# Patient Record
Sex: Male | Born: 1965 | Race: Black or African American | Hispanic: No | Marital: Single | Smoking: Never smoker
Health system: Southern US, Community
[De-identification: ages and names within clinical notes are randomized; demographics above are authoritative.]

## PROBLEM LIST (undated history)

## (undated) DIAGNOSIS — I639 Cerebral infarction, unspecified: Secondary | ICD-10-CM

## (undated) DIAGNOSIS — N183 Chronic kidney disease, stage 3 unspecified: Secondary | ICD-10-CM

## (undated) DIAGNOSIS — I10 Essential (primary) hypertension: Secondary | ICD-10-CM

## (undated) DIAGNOSIS — E785 Hyperlipidemia, unspecified: Secondary | ICD-10-CM

## (undated) DIAGNOSIS — R109 Unspecified abdominal pain: Secondary | ICD-10-CM

## (undated) DIAGNOSIS — F32A Depression, unspecified: Secondary | ICD-10-CM

## (undated) DIAGNOSIS — F329 Major depressive disorder, single episode, unspecified: Secondary | ICD-10-CM

## (undated) DIAGNOSIS — E119 Type 2 diabetes mellitus without complications: Secondary | ICD-10-CM

## (undated) DIAGNOSIS — Z8669 Personal history of other diseases of the nervous system and sense organs: Secondary | ICD-10-CM

## (undated) DIAGNOSIS — F1911 Other psychoactive substance abuse, in remission: Secondary | ICD-10-CM

## (undated) HISTORY — DX: Depression, unspecified: F32.A

## (undated) HISTORY — DX: Personal history of other diseases of the nervous system and sense organs: Z86.69

## (undated) HISTORY — DX: Hyperlipidemia, unspecified: E78.5

## (undated) HISTORY — PX: NO PAST SURGERIES: SHX2092

## (undated) HISTORY — DX: Essential (primary) hypertension: I10

## (undated) HISTORY — DX: Type 2 diabetes mellitus without complications: E11.9

## (undated) HISTORY — DX: Other psychoactive substance abuse, in remission: F19.11

## (undated) HISTORY — DX: Major depressive disorder, single episode, unspecified: F32.9

## (undated) HISTORY — DX: Unspecified abdominal pain: R10.9

## (undated) HISTORY — DX: Chronic kidney disease, stage 3 unspecified: N18.30

## (undated) HISTORY — DX: Cerebral infarction, unspecified: I63.9

---

## 2006-11-11 DIAGNOSIS — I639 Cerebral infarction, unspecified: Secondary | ICD-10-CM

## 2006-11-11 HISTORY — DX: Cerebral infarction, unspecified: I63.9

## 2012-09-30 ENCOUNTER — Telehealth: Payer: Self-pay | Admitting: Family

## 2012-09-30 ENCOUNTER — Ambulatory Visit (INDEPENDENT_AMBULATORY_CARE_PROVIDER_SITE_OTHER): Payer: Medicare Other | Admitting: Family

## 2012-09-30 ENCOUNTER — Encounter: Payer: Self-pay | Admitting: Family

## 2012-09-30 VITALS — BP 122/80 | HR 89 | Temp 98.5°F | Resp 16 | Ht 67.25 in | Wt 182.0 lb

## 2012-09-30 DIAGNOSIS — E1165 Type 2 diabetes mellitus with hyperglycemia: Secondary | ICD-10-CM

## 2012-09-30 DIAGNOSIS — I10 Essential (primary) hypertension: Secondary | ICD-10-CM | POA: Insufficient documentation

## 2012-09-30 DIAGNOSIS — M545 Other chronic pain: Secondary | ICD-10-CM | POA: Insufficient documentation

## 2012-09-30 DIAGNOSIS — G8929 Other chronic pain: Secondary | ICD-10-CM

## 2012-09-30 DIAGNOSIS — F329 Major depressive disorder, single episode, unspecified: Secondary | ICD-10-CM

## 2012-09-30 DIAGNOSIS — R7989 Other specified abnormal findings of blood chemistry: Secondary | ICD-10-CM | POA: Insufficient documentation

## 2012-09-30 DIAGNOSIS — R634 Abnormal weight loss: Secondary | ICD-10-CM

## 2012-09-30 DIAGNOSIS — E291 Testicular hypofunction: Secondary | ICD-10-CM

## 2012-09-30 DIAGNOSIS — E11319 Type 2 diabetes mellitus with unspecified diabetic retinopathy without macular edema: Secondary | ICD-10-CM | POA: Insufficient documentation

## 2012-09-30 DIAGNOSIS — E119 Type 2 diabetes mellitus without complications: Secondary | ICD-10-CM

## 2012-09-30 DIAGNOSIS — IMO0002 Reserved for concepts with insufficient information to code with codable children: Secondary | ICD-10-CM | POA: Insufficient documentation

## 2012-09-30 DIAGNOSIS — Z8673 Personal history of transient ischemic attack (TIA), and cerebral infarction without residual deficits: Secondary | ICD-10-CM

## 2012-09-30 DIAGNOSIS — E785 Hyperlipidemia, unspecified: Secondary | ICD-10-CM

## 2012-09-30 LAB — BASIC METABOLIC PANEL WITH GFR
BUN: 10 mg/dL (ref 6–23)
CO2: 26 mEq/L (ref 19–32)
Calcium: 9.8 mg/dL (ref 8.4–10.5)
Chloride: 102 mEq/L (ref 96–112)
Creat: 1 mg/dL (ref 0.50–1.35)
GFR, Est African American: >89 mL/min
GFR, Est Non African American: 89 mL/min
Glucose, Bld: 263 mg/dL — ABNORMAL HIGH (ref 70–99)
Potassium: 4 mEq/L (ref 3.5–5.3)
Sodium: 138 mEq/L (ref 135–145)

## 2012-09-30 LAB — HEPATIC FUNCTION PANEL
ALT: 16 U/L (ref 0–53)
AST: 12 U/L (ref 0–37)
Albumin: 4.3 g/dL (ref 3.5–5.2)
Alkaline Phosphatase: 67 U/L (ref 39–117)
Bilirubin, Direct: 0.1 mg/dL (ref 0.0–0.3)
Indirect Bilirubin: 0.8 mg/dL (ref 0.0–0.9)
Total Bilirubin: 0.9 mg/dL (ref 0.3–1.2)
Total Protein: 6.9 g/dL (ref 6.0–8.3)

## 2012-09-30 LAB — HEMOGLOBIN A1C
Hgb A1c MFr Bld: 14.1 % — ABNORMAL HIGH (ref <5.7)
Mean Plasma Glucose: 358 mg/dL — ABNORMAL HIGH (ref <117)

## 2012-09-30 LAB — TSH: TSH: 0.752 u[IU]/mL (ref 0.350–4.500)

## 2012-09-30 MED ORDER — GABAPENTIN 100 MG PO CAPS: 100.0000 mg | ORAL_CAPSULE | Freq: Three times a day (TID) | ORAL | Status: DC

## 2012-09-30 NOTE — Progress Notes (Signed)
Subjective:    Patient ID: James Chang, male    DOB: 1966-02-09, 46 y.o.   MRN: [id number]  HPI  James Chang is a 46 yr old male who presents today to establish care.    1)Depression- He follows with psychiatry.  He is prescribed Abilify- follows with [facility].  He does not take his abilify. "I just don't like it."  2) DM2- Diagnosed in 2002.  Sometimes forgets his lantus.  He reports that he usually takes 35-40 units.  Does not check sugars. Using 25 units of humalog AC meals.  Last A1C last week was 13.6.  Last eye exam was a few months ago at [facility]. (told Diabetic retinopathy in the right eye- mild)  3) HTN-Currently on lisinopril- denies ace cough.  4) Hyperlipidemia- He is on lipitor.    5) Migraines- reports that he has daily migraines.  Use "a hot rag and lays down."  Helps sometimes.  He reports that he used ibuprofen in the past but it caused GI upset.   6) Stroke- 2008, reports residual left arm numbness/weakness.    7) Low testosterone- Uses androgel.    8) Back pain/chronic- He reports that he has been following with Dr. Pryor Montes previously- reports that he was treated with oxycontin.  Pain is in the lower back (reports L3, L4,L5) bulging discs. Pain radiates into both legs. + throbbing.     Review of Systems  Constitutional:       Reports that his weight 1 year ago was 238 and has been as low as 170 in the last year  HENT: Negative for congestion.   Eyes: Positive for visual disturbance.  Respiratory: Negative for cough.   Cardiovascular: Negative for leg swelling.  Gastrointestinal: Negative for vomiting, diarrhea and constipation.  Genitourinary: Positive for frequency. Negative for dysuria and hematuria.  Musculoskeletal: Positive for back pain.  Skin: Negative for rash.  Neurological: Positive for headaches.  Hematological: Negative for adenopathy.  Psychiatric/Behavioral:       Reports + hx of anxiety- has been treated with klonopin in the past   Past  Medical History  Diagnosis Date  . Depression   . Diabetes mellitus without complication   . Hypertension   . Hyperlipidemia   . Stroke 2008  . History of migraine   . Personal history of drug abuse     History   Social History  . Marital Status: Single    Spouse Name: N/A    Number of Children: 3  . Years of Education: N/A   Occupational History  . Not on file.   Social History Main Topics  . Smoking status: Never Smoker   . Smokeless tobacco: Former Neurosurgeon  . Alcohol Use: Yes     Comment: seldom  . Drug Use: Not on file  . Sexually Active: Not on file   Other Topics Concern  . Not on file   Social History Narrative   Disabled truck driver- since stroke.Has 63 year old mother who is frail- he is now staying with her.3 children- youngest is 22.SingleCompleted the 12th gradePrevious hx of crack cocaine abuse.  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    Past Surgical History  Procedure Date  . No past surgeries     Family History  Problem Relation Age of Onset  . Arthritis Mother   . Stroke Father   . Hypertension Father   . Kidney disease Father   . Heart attack Father   . Cancer Neg Hx   .  Diabetes Neg Hx     No Known Allergies  Current Outpatient Prescriptions on File Prior to Visit  Medication Sig Dispense Refill  . gabapentin (NEURONTIN) 100 MG capsule Take 1 capsule (100 mg total) by mouth 3 (three) times daily.  90 capsule  3  . insulin glargine (LANTUS SOLOSTAR) 100 UNIT/ML injection Inject 70 Units into the skin at bedtime.      . insulin lispro (HUMALOG KWIKPEN) 100 UNIT/ML injection Inject 25 Units into the skin 3 (three) times daily before meals.      Marland Kitchen lisinopril (PRINIVIL,ZESTRIL) 10 MG tablet Take 10 mg by mouth daily.      . Testosterone (ANDROGEL) 20.25 MG/1.25GM (1.62%) GEL Place 4 Act onto the skin daily.        BP 122/80  Pulse 89  Temp 98.5 F (36.9 C) (Oral)  Resp 16  Ht 5' 7.25" (1.708 m)  Wt 182 lb (82.555 kg)  BMI 28.29 kg/m2  SpO2 99%        Objective:   Physical Exam  Constitutional: He is oriented to person, place, and time. He appears well-developed and well-nourished. No distress.  HENT:  Head: Normocephalic and atraumatic.  Eyes: Conjunctivae normal are normal.  Cardiovascular: Normal rate and regular rhythm.   No murmur heard. Pulmonary/Chest: Effort normal and breath sounds normal. No respiratory distress. He has no wheezes. He has no rales. He exhibits no tenderness.  Musculoskeletal: He exhibits no edema.  Neurological: He is alert and oriented to person, place, and time.  Skin: Skin is warm and dry.  Psychiatric: He has a normal mood and affect. His behavior is normal. Judgment and thought content normal.          Assessment & Plan:

## 2012-09-30 NOTE — Assessment & Plan Note (Signed)
He is maintained on androgel.

## 2012-09-30 NOTE — Assessment & Plan Note (Signed)
Obtain A1C, urine microalbumin, titrate up lantus insulin 3 units every 3 days until fasting sugar is <110.

## 2012-09-30 NOTE — Patient Instructions (Addendum)
Please complete your lab work prior to leaving. Increase lantus insulin by 3 units every 3 days until fasting sugar is <110.  Call if sugar <70. You will be contacted about your referral to Pain Management.  Please let us know if you have not heard back within 1 week about your referral. Please schedule a follow up appointment in 3 months. Welcome to [facility]!

## 2012-09-30 NOTE — Assessment & Plan Note (Signed)
Per pt this has been well controlled.  He is not compliant with psych recommendations though.

## 2012-09-30 NOTE — Assessment & Plan Note (Signed)
Will refer to pain management.  In the meantime, give trial of gabapentin.

## 2012-09-30 NOTE — Assessment & Plan Note (Signed)
BP is stable on lisinopril. Continue same, obtain bmet.  BP: 122/80 mmHg

## 2012-09-30 NOTE — Assessment & Plan Note (Signed)
He is not fasting today.  Plan FLP next visit.

## 2012-09-30 NOTE — Assessment & Plan Note (Signed)
Pt with residual left sided weakness.  He is not on aspirin.  Will add baby aspirin once daily for stroke prevention.  Work towards improving glycemic control and lipids.

## 2012-09-30 NOTE — Telephone Encounter (Signed)
Received medical records from [facility]  P: [phone] F: [phone]

## 2012-10-01 LAB — MICROALBUMIN / CREATININE URINE RATIO
Creatinine, Urine: 111.7 mg/dL
Microalb, Ur: <0.5 mg/dL (ref 0.00–1.89)

## 2012-10-07 ENCOUNTER — Encounter: Payer: Self-pay | Admitting: Family

## 2012-10-28 ENCOUNTER — Ambulatory Visit (INDEPENDENT_AMBULATORY_CARE_PROVIDER_SITE_OTHER): Payer: 59 | Admitting: Family

## 2012-10-28 ENCOUNTER — Encounter: Payer: Self-pay | Admitting: Family

## 2012-10-28 VITALS — BP 104/74 | HR 85 | Temp 98.5°F | Resp 16 | Ht 67.25 in | Wt 185.1 lb

## 2012-10-28 DIAGNOSIS — M545 Low back pain: Secondary | ICD-10-CM

## 2012-10-28 DIAGNOSIS — I10 Essential (primary) hypertension: Secondary | ICD-10-CM

## 2012-10-28 DIAGNOSIS — E1139 Type 2 diabetes mellitus with other diabetic ophthalmic complication: Secondary | ICD-10-CM

## 2012-10-28 DIAGNOSIS — G8929 Other chronic pain: Secondary | ICD-10-CM

## 2012-10-28 DIAGNOSIS — E119 Type 2 diabetes mellitus without complications: Secondary | ICD-10-CM

## 2012-10-28 DIAGNOSIS — E11319 Type 2 diabetes mellitus with unspecified diabetic retinopathy without macular edema: Secondary | ICD-10-CM

## 2012-10-28 MED ORDER — GABAPENTIN 300 MG PO CAPS: 300.0000 mg | ORAL_CAPSULE | Freq: Three times a day (TID) | ORAL | Status: DC

## 2012-10-28 NOTE — Assessment & Plan Note (Signed)
Unchanged. Has not titrated lantus as we discussed.  Will refer to endocrinology for further evaluation.

## 2012-10-28 NOTE — Patient Instructions (Signed)
Please call Dr. Cyndia Diver office to schedule pain management appointment: [phone] You will be contact about your referral to endocrinology. Please let us know if you have not heard back within 1 week about your referral. Follow up in 3 months.

## 2012-10-28 NOTE — Assessment & Plan Note (Signed)
Unchanged. Our office was contacted by Dr. Cyndia Diver office re: pain management referral and told that the patient did not return their calls to schedule an appointment and that a letter would be mailed.  Will increase gabapentin to 300mg  tid.  I have instructed pt to contact Dr. Cyndia Diver office to arrange an appointment- number was provided.

## 2012-10-28 NOTE — Progress Notes (Signed)
Subjective:    Patient ID: James Chang, male    DOB: 08/21/1966, 46 y.o.   MRN: [id number]  HPI  James Chang is a 46 yr old male who presents today for follow up.   Back Pain- Back pain radiates into his knees/ankles.  He has "doubled up on gabapentin."   Diabetes- reports 25 units humalog AC meals and 40 units lantus HS. He has not titrated up his lantus as we discussed last visit.   Review of Systems    see HPI  Past Medical History  Diagnosis Date  . Depression   . Diabetes mellitus without complication   . Hypertension   . Hyperlipidemia   . Stroke 2008  . History of migraine   . Personal history of drug abuse     History   Social History  . Marital Status: Single    Spouse Name: N/A    Number of Children: 3  . Years of Education: N/A   Occupational History  . Not on file.   Social History Main Topics  . Smoking status: Never Smoker   . Smokeless tobacco: Former Neurosurgeon  . Alcohol Use: Yes     Comment: seldom  . Drug Use: Not on file  . Sexually Active: Not on file   Other Topics Concern  . Not on file   Social History Narrative   Disabled truck driver- since stroke.Has 76 year old mother who is frail- he is now staying with her.3 children- youngest is 68.SingleCompleted the 12th gradePrevious hx of crack cocaine abuse.  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    Past Surgical History  Procedure Date  . No past surgeries     Family History  Problem Relation Age of Onset  . Arthritis Mother   . Stroke Father   . Hypertension Father   . Kidney disease Father   . Heart attack Father   . Cancer Neg Hx   . Diabetes Neg Hx     No Known Allergies  Current Outpatient Prescriptions on File Prior to Visit  Medication Sig Dispense Refill  . atorvastatin (LIPITOR) 10 MG tablet Take 10 mg by mouth daily.      . insulin glargine (LANTUS SOLOSTAR) 100 UNIT/ML injection Inject 70 Units into the skin at bedtime.      . insulin lispro (HUMALOG KWIKPEN) 100 UNIT/ML  injection Inject 25 Units into the skin 3 (three) times daily before meals.      Marland Kitchen lisinopril (PRINIVIL,ZESTRIL) 10 MG tablet Take 10 mg by mouth daily.      . Testosterone (ANDROGEL) 20.25 MG/1.25GM (1.62%) GEL Place 4 Act onto the skin daily.      Marland Kitchen gabapentin (NEURONTIN) 300 MG capsule Take 1 capsule (300 mg total) by mouth 3 (three) times daily.  90 capsule  2    BP 104/74  Pulse 85  Temp 98.5 F (36.9 C) (Oral)  Resp 16  Ht 5' 7.25" (1.708 m)  Wt 185 lb 1.3 oz (83.952 kg)  BMI 28.77 kg/m2  SpO2 99%    Objective:   Physical Exam  Constitutional: He is oriented to person, place, and time. He appears well-developed and well-nourished. No distress.  HENT:  Head: Normocephalic and atraumatic.  Cardiovascular: Normal rate.   No murmur heard. Pulmonary/Chest: Effort normal and breath sounds normal.  Musculoskeletal: He exhibits no edema.  Neurological: He is alert and oriented to person, place, and time.  Skin: Skin is warm.  Psychiatric: He has a normal mood and affect.  His behavior is normal. Judgment and thought content normal.          Assessment & Plan:

## 2012-11-02 ENCOUNTER — Ambulatory Visit (INDEPENDENT_AMBULATORY_CARE_PROVIDER_SITE_OTHER): Payer: 59 | Admitting: Internal Medicine

## 2012-11-02 ENCOUNTER — Encounter: Payer: Self-pay | Admitting: Internal Medicine

## 2012-11-02 VITALS — BP 128/82 | HR 93 | Temp 98.7°F | Wt 188.0 lb

## 2012-11-02 DIAGNOSIS — E11319 Type 2 diabetes mellitus with unspecified diabetic retinopathy without macular edema: Secondary | ICD-10-CM

## 2012-11-02 DIAGNOSIS — E1139 Type 2 diabetes mellitus with other diabetic ophthalmic complication: Secondary | ICD-10-CM

## 2012-11-02 DIAGNOSIS — E1165 Type 2 diabetes mellitus with hyperglycemia: Secondary | ICD-10-CM

## 2012-11-02 MED ORDER — INSULIN LISPRO 100 UNIT/ML ~~LOC~~ SOLN: 20.0000 [IU] | Freq: Three times a day (TID) | SUBCUTANEOUS | Status: DC

## 2012-11-02 MED ORDER — GLUCOSE BLOOD VI STRP: ORAL_STRIP | Status: DC

## 2012-11-02 MED ORDER — INSULIN GLARGINE 100 UNIT/ML ~~LOC~~ SOLN: 20.0000 [IU] | Freq: Every day | SUBCUTANEOUS | Status: DC

## 2012-11-02 MED ORDER — ONETOUCH ULTRASOFT LANCETS MISC: Status: DC

## 2012-11-02 NOTE — Patient Instructions (Addendum)
Please check your sugars 2x a day as advised. Bring your sugar log at our next appointment. Please call me on Friday with your sugars Please start: - Lantus 20 units every night - Humalog 20 units 3 times a day 15 min before meals I referred you for Diabetes Education.  Patient instructions for Diabetes mellitus type 2:  DIET AND EXERCISE Diet and exercise is an important part of diabetic treatment.  We recommended aerobic exercise in the form of brisk walking (working between 40-60% of maximal aerobic capacity, similar to brisk walking) for 150 minutes per week (such as 30 minutes five days per week) along with 3 times per week performing 'resistance' training (using various gauge rubber tubes with handles) 5-10 exercises involving the major muscle groups (upper body, lower body and core) performing 10-15 repetitions (or near fatigue) each exercise. Start at half the above goal but build slowly to reach the above goals. If limited by weight, joint pain, or disability, we recommend daily walking in a swimming pool with water up to waist to reduce pressure from joints while allow for adequate exercise.    BLOOD GLUCOSES Monitoring your blood glucoses is important for continued management of your diabetes. Please check your blood glucoses 2 times a day: fasting, before meals and at bedtime (you can rotate these measurements   HYPOGLYCEMIA (low blood sugar) Hypoglycemia is usually a reaction to not eating, exercising, or taking too much insulin/ other diabetes drugs.  Symptoms include tremors, sweating, hunger, confusion, headache, etc. Treat IMMEDIATELY with 15 grams of Carbs:   4 glucose tablets    cup regular juice/soda   2 tablespoons raisins   4 teaspoons sugar   1 tablespoon honey Recheck blood glucose in 15 mins and repeat above if still symptomatic/blood glucose <100. Please contact our office at [phone] if you have questions about how to next handle your  insulin.  RECOMMENDATIONS TO REDUCE YOUR RISK OF DIABETIC COMPLICATIONS: * Take your prescribed MEDICATION(S). * Follow a DIABETIC diet: Complex carbs, fiber rich foods, heart healthy fish twice weekly, (monounsaturated and polyunsaturated) fats * AVOID saturated/trans fats, high fat foods, >2,300 mg salt per day. * EXERCISE at least 5 times a week for 30 minutes or preferably daily.  * DO NOT SMOKE OR DRINK more than 1 drink a day. * Check your FEET every day. Do not wear tightfitting shoes. Contact us if you develop an ulcer * See your EYE doctor once a year or more if needed * Get a FLU shot once a year * Get a PNEUMONIA vaccine every 5 years and once after age 37 years  GOALS:  * Your Hemoglobin A1c of 7%  * Your Systolic BP should be 130 or lower  * Your Diastolic BP should be 80 or lower  * Your HDL (Good Cholesterol) should be 40 or higher  * Your LDL (Bad Cholesterol) should be 100 or lower  * Your Triglycerides should be 150 or lower  * Your Urine microalbumin (kidney function) of <30 * Your Body Mass Index should be 25 or lower  We will be glad to help you achieve these goals. Our telephone number is: [phone].

## 2012-11-02 NOTE — Progress Notes (Signed)
Subjective:     Patient ID: James Chang, male   DOB: 17-Feb-1966, 46 y.o.   MRN: [id number]  HPI James Chang is a pleasant 46 y/o man referred by PCP, James Craze NP, for management of DM2, uncontrolled, insulin-dependent, c/w retinopathy and neuropathy.  He was dx with DM2 in 2000. He was initially on oral medications (which he did not really take consistently), then soon after, on insulin. His last HbA1c: Lab Results  Component Value Date   HGBA1C 14.1* 09/30/2012  He is taking: - Lantus: is supposed to be on 70 units in hs but taking only 35-40 units when he decides to take it (upon questioning, admits not taking it 7/7 days!!!)  (of note, he took 35 units of Levemir 3 days ago - he had this from a previous regimen) - Humalog 25 units tid ac (he usually takes the morning one only) He denies having problems with paying for the meds as they are covered by his insurance. He tells me that he felt terrible when taking the 70 units (likely hypoglycemia) He does not check his sugars. In the last 2-3 years, he did not get lower than 230. He thinks he feels when getting hypoglycemia. No problems with the injection sites. He injects in the abdomen.  He lost a lot of weight from 238 to 170 in ~4-5 years, recently gained up to 188 lbs. He has nocturia 4-5 times a night and urinates frequently over the day. He is thirsty all the time - drinks mostly water, but soda, too. He has 3 large meals a day. They are approximately equal in size.   He has numbness and tingling right leg and hand. He has retinopathy and experiences blurry vision in both eyes. His last eye exam was last month.   He had the flu vaccine this season.  He is seen at [facility]; was recently taken off Klonopin completely 2 mo ago.   I reviewed his chart including office notes, telephone notes, labs, and available scanned records.  Review of Systems Constitutional: both weight gain/loss (see above), +  fatigue Eyes: + blurry vision ENT: no sore throat, no nodules palpated in throat Cardiovascular: no CP/SOB/palpitations/leg swelling Respiratory: no cough/SOB Gastrointestinal: no N/V/D/C now, had 2 episodes of N/V last week Musculoskeletal: + muscle/joint aches Skin: no rashes Neurological: no tremors, + numbness/tingling, no dizziness. + HA. Psychiatric: + depression about not working, + anxiety Also low libido and difficulty with erections.  Past Medical History  Diagnosis Date  . Depression   . Diabetes mellitus without complication   . Hypertension   . Hyperlipidemia   . Stroke 2008  . History of migraine   . Personal history of drug abuse    Past Surgical History  Procedure Date  . No past surgeries    History   Social History  . Marital Status: Single    Spouse Name: N/A    Number of Children: 3  . Years of Education: N/A   Occupational History  . Not on file.   Social History Main Topics  . Smoking status: Never Smoker   . Smokeless tobacco: Former Neurosurgeon  . Alcohol Use: Yes     Comment: seldom  . Drug Use: Not on file  . Sexually Active: Not on file   Other Topics Concern  . 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   Current Outpatient Prescriptions on File Prior to Visit  Medication Sig Dispense Refill  . atorvastatin (LIPITOR) 10 MG tablet Take 10 mg by mouth daily.      Marland Kitchen gabapentin (NEURONTIN) 300 MG capsule Take 1 capsule (300 mg total) by mouth 3 (three) times daily.  90 capsule  2  . insulin glargine (LANTUS SOLOSTAR) 100 UNIT/ML injection Inject 20 Units into the skin at bedtime.  15 mL  11  . insulin lispro (HUMALOG KWIKPEN) 100 UNIT/ML injection Inject 20 Units into the skin 3 (three) times daily before meals.  15 mL  11  .  lisinopril (PRINIVIL,ZESTRIL) 10 MG tablet Take 10 mg by mouth daily.      . Testosterone (ANDROGEL) 20.25 MG/1.25GM (1.62%) GEL Place 4 Act onto the skin daily.       No Known Allergies  Family History  Problem Relation Age of Onset  . Arthritis Mother   . Stroke Father   . Hypertension Father   . Kidney disease Father   . Heart attack Father   . Cancer Neg Hx   . Diabetes Neg Hx    Objective:   Physical Exam BP 128/82  Pulse 93  Temp 98.7 F (37.1 C) (Oral)  Wt 188 lb (85.276 kg)  SpO2 97% Wt Readings from Last 3 Encounters:  11/02/12 188 lb (85.276 kg)  10/28/12 185 lb 1.3 oz (83.952 kg)  09/30/12 182 lb (82.555 kg)   Constitutional:  AA man, with normal weight, in NAD Eyes: PERRLA, EOMI, no exophthalmos ENT: moist mucous membranes, missing teeth, no thyromegaly, no cervical lymphadenopathy Cardiovascular: RRR, No MRG Respiratory: CTA B Gastrointestinal: abdomen soft, NT, ND, BS+ Musculoskeletal: no deformities, strength intact in all 4 Skin: moist, warm, no rashes Neurological: no tremor with outstretched hands, DTR normal in all 4    Assessment:     1. Dm2, uncontrolled, insulin-dependent, with complications - retinopathy - neuropathy - long h/o noncompliance    Plan:     - pt with very uncontrolled DM2, admitting for almost total noncompliance with his diabetic regimen, but not for financial purposes - we had a long discussion about consequences of perpetually uncontrolled DM2 and he tells me he would like to start getting his sugars down - I offered to either continue his (intended) Lantus + Humalog tid regimen or to try a combination long+rapid acting regimen (e.g. 70/30 or 75/25) bid. He understood that the first choice means 4 injections a day while the second choice means only 2. He opted for the first choice. - We will start with: Lantus 20 units in HS Humalog 20 units tid ac - I advised him to call me in the next few days to adjust his Lantus as I  expect he will need more  - I refilled his Lantus and Humalog - I gave him a OneTouch UltraMini meter - I sent Rx for lancets and strips to his pharmacy - given general instructions for DM2, proper foot care handout, hypoglycemia management handout and brochures about healthy eating, injection sites, sick day rules - I also referred him for DM Education - No labs for today - I will see him back in 1 month, If the above regimen does not work for him, we will simplify the regimen at next visit.

## 2012-11-10 ENCOUNTER — Telehealth: Payer: Self-pay | Admitting: Internal Medicine

## 2012-11-10 NOTE — Telephone Encounter (Signed)
The patient called to report the glucometer that Dr. Elvera Lennox gave him at his last appointment is no longer working.  He is wondering if he is able to obtain another glucometer.  Please call the patient at [phone].

## 2012-11-10 NOTE — Telephone Encounter (Signed)
James Chang, can you please ask him to come in to get another one?  I can also send a Rx to his pharmacy for one that is covered by his insurance if he prefers - in this case, he needs to check with his insurance about the covered brands.

## 2012-11-10 NOTE — Telephone Encounter (Signed)
Left message pt to call ins copy for coverage, in the meantime, pt can come by the office and pick up meter

## 2012-11-18 ENCOUNTER — Telehealth: Payer: Self-pay | Admitting: Internal Medicine

## 2012-11-18 NOTE — Telephone Encounter (Signed)
Left message on cell # of Rx at his pharmacy.

## 2012-11-18 NOTE — Telephone Encounter (Signed)
The patient called to request that Dr. Elvera Lennox call in Lantus Solostar and One Touch Mini test strips to [facility].  Patient may be reached at [phone].

## 2012-11-18 NOTE — Telephone Encounter (Signed)
I called the pharmacy and they have both prescriptions (I sent those at the time of his appt on 11/02/12). Fannie Knee, can you please let him know? Thank you.

## 2012-11-24 ENCOUNTER — Telehealth: Payer: Self-pay | Admitting: Family

## 2012-11-24 ENCOUNTER — Telehealth: Payer: Self-pay | Admitting: Internal Medicine

## 2012-11-24 DIAGNOSIS — G629 Polyneuropathy, unspecified: Secondary | ICD-10-CM | POA: Insufficient documentation

## 2012-11-24 MED ORDER — PREGABALIN 50 MG PO CAPS: ORAL_CAPSULE | ORAL | Status: DC

## 2012-11-24 NOTE — Telephone Encounter (Signed)
The patient called to request a rx for Lyrica for his pain.  The patient may be reached at [phone].

## 2012-11-24 NOTE — Telephone Encounter (Signed)
Is calling to see if MD will write script for Lyrica. Advised per EPIC, has been sent to pharmacy. Should start slow and increase as instructed on prescription. Is not to use Neurontin while taking Lyrica. Voices understanding.

## 2012-11-24 NOTE — Telephone Encounter (Signed)
rx faxed to [facility]

## 2012-12-01 ENCOUNTER — Encounter: Payer: Self-pay | Admitting: *Deleted

## 2012-12-01 ENCOUNTER — Encounter: Payer: 59 | Attending: Internal Medicine | Admitting: *Deleted

## 2012-12-01 VITALS — Ht 66.0 in | Wt 183.2 lb

## 2012-12-01 DIAGNOSIS — Z713 Dietary counseling and surveillance: Secondary | ICD-10-CM | POA: Insufficient documentation

## 2012-12-01 DIAGNOSIS — E1139 Type 2 diabetes mellitus with other diabetic ophthalmic complication: Secondary | ICD-10-CM | POA: Insufficient documentation

## 2012-12-01 DIAGNOSIS — E1165 Type 2 diabetes mellitus with hyperglycemia: Secondary | ICD-10-CM | POA: Insufficient documentation

## 2012-12-01 DIAGNOSIS — E11319 Type 2 diabetes mellitus with unspecified diabetic retinopathy without macular edema: Secondary | ICD-10-CM

## 2012-12-01 NOTE — Patient Instructions (Signed)
Plan:  Aim for 5 Carb Choices per meal (75 grams) +/- 1 either way  Aim for 0-2 Carbs per snack if hungry  Consider reading food labels for Total Carbohydrate of foods Consider taking insulin more consistently before meals and at 9 PM as directed by MD Consider talking to MD about switching to 70/30 insulin as she has suggested so only 2 injections / day Consider 8 oz cup of regular soda at meals (= 2 Carb choices) and the rest of your beverage as water

## 2012-12-01 NOTE — Progress Notes (Signed)
Medical Nutrition Therapy:  Appt start time: 1100 end time:  1200.  Assessment:  Primary concerns today: patient here for DM2 uncontrolled. Retired [employer] architect, currently disabled. Cares for his mother who is frail at 48 years old. He prepares her meals for her to help keep her weight up. He started our visit stating he knows it's his fault that diabetes is out of control and that he has been told what all the complications are, but that doesn't have any impact on his compliance. He also complains of neuropathy with severe pain in his legs and retinopathy. He now has a meter and states he SMBG a few times a week. He doesn't like having to take 4 shots a day plus checking his BG daily so he skips either if he doesn't feel like doing it.  MEDICATIONS: see list   DIETARY INTAKE:  Usual eating pattern includes 3 meals and 3 or more snacks per day.  Everyday foods include mostly meats and starches, limited fruits and vegetables.  Avoided foods include none stated.    24-hr recall:  B ( AM): 2 sausage patties, 2 eggs, large bowl of grits, cup of fruit, 2 toast with butter with honey, 2-3 glasses regular soda or sweet tea  Snk ( AM): variety of crackers, chips, occasionally fresh or canned fruit, cookies, candy, whatever he is hungry for  L ( PM): could be main meal: baked meat, starch and occasionally a vegetable OR Oodles of Noodles, soda, sweet tea or water Snk ( PM): same as AM D ( PM): cooks again, full meal as able Snk ( PM): sandwich or PNB crackers Beverages: soda, sweet tea or water   Usual physical activity: none due to neuropathy and back pain  Estimated energy needs: 2400 calories 270 g carbohydrates 180 g protein 67 g fat  Progress Towards Goal(s):  In progress.   Nutritional Diagnosis:  NB-1.1 Food and nutrition-related knowledge deficit As related to uncontrolled Diabetes.  As evidenced by A1c of 14.1% on 09/30/12.    Intervention:  Nutrition counseling and diabetes  education initiated. Discussed basic physiology of diabetes, SMBG and how to use the information to make decisions about his management. Reviewed explanation of  A1c and that a decrease of 1-2% every 3 months would be a significant improvement. Introduced him to [employer] using food groups to teach and he expressed very good understanding of that concept and when I showed him the Carb content of his regular sodas he exclaimed "That's way too much!" So I have suggested he aim for 8 oz of soda and use water for remainder of his beverages at each meal. He agreed. Finally taught him about the insulin action of his Lantus and Humalog vs. A pre mixed insulin per MD notes and he feels he would be better able to take his insulin more consistently with the pre-mixed option.   Plan:  Aim for 5 Carb Choices per meal (75 grams) +/- 1 either way  Aim for 0-2 Carbs per snack if hungry  Consider reading food labels for Total Carbohydrate of foods Consider taking Humalot insulin more consistently before meals and Lantus at 9 PM as directed by MD Consider talking to MD about switching to 70/30 insulin as she has suggested so only 2 injections / day Consider 8 oz cup of regular soda at meals (= 2 Carb choices) and the rest of your beverage as water  Handouts given during visit include: Living Well with Diabetes Carb Counting and Food  Label handouts Meal Plan Card  Diabetes Medication Sheet to show insulin action  Monitoring/Evaluation:  Dietary intake, exercise, taking insulin more consistentlh, and body weight in 1 month(s).

## 2012-12-04 ENCOUNTER — Ambulatory Visit (INDEPENDENT_AMBULATORY_CARE_PROVIDER_SITE_OTHER): Payer: 59 | Admitting: Internal Medicine

## 2012-12-04 ENCOUNTER — Encounter: Payer: Self-pay | Admitting: Internal Medicine

## 2012-12-04 VITALS — BP 110/80 | HR 81 | Temp 97.7°F | Resp 16 | Ht 67.0 in | Wt 177.0 lb

## 2012-12-04 DIAGNOSIS — E1139 Type 2 diabetes mellitus with other diabetic ophthalmic complication: Secondary | ICD-10-CM

## 2012-12-04 DIAGNOSIS — G629 Polyneuropathy, unspecified: Secondary | ICD-10-CM

## 2012-12-04 DIAGNOSIS — E1165 Type 2 diabetes mellitus with hyperglycemia: Secondary | ICD-10-CM

## 2012-12-04 DIAGNOSIS — G609 Hereditary and idiopathic neuropathy, unspecified: Secondary | ICD-10-CM

## 2012-12-04 DIAGNOSIS — E11319 Type 2 diabetes mellitus with unspecified diabetic retinopathy without macular edema: Secondary | ICD-10-CM

## 2012-12-04 MED ORDER — INSULIN ASPART PROT & ASPART (70-30 MIX) 100 UNIT/ML ~~LOC~~ SUSP: SUBCUTANEOUS | Status: DC

## 2012-12-04 NOTE — Progress Notes (Signed)
Subjective:     Patient ID: James Chang, male   DOB: 16-Aug-1966, 47 y.o.   MRN: [id number]  HPI James Chang is a pleasant 47 y/o man returning for f/u for DM2, uncontrolled, insulin-dependent, c/w retinopathy and neuropathy.  He was dx with DM2 in 2000. He was initially on oral medications (which he did not really take consistently), then soon after, on insulin. His last HbA1c: Lab Results  Component Value Date   HGBA1C 14.1* 09/30/2012  Last microalbumin over creatinine ratio was undetectable 2 months ago.  When I saw him 47 years ago, he was not checking his sugars. I advised him to do so, and I gave him sugar logs to record them. He was telling me then that in the last 2-3 years, he did not get lower than 230. He thinks he feels when getting hypoglycemia. No problems with the injection sites. He injects in the abdomen.  He checks sugars 2x a day: - am: 214-250 - hS: 240-250 No lows. Highest: 260.  At last visit, since he wasn't taking his insulin as advised, we had to start anew, so I advised him to take: Lantus 35 units in HS Humalog 25 units (should be tid ac), but taking it 1-2 a day - usually b'fast I advised him then to call me soon after that visit for pattern management and insulin dose adjusting, but he did not do so.  He denied having problems with paying for the meds as they are covered by his insurance. We did discuss at last visit whether we should switch him to a more simple insulin regimen, a 70/30 combination, but he refused. I referred him to diabetes education, and he had an appointment with James Chang few days ago, during which he expressed his wish for more simple regimen, and he was open to a combination dosing.  His diet is still poor, but he still lost some weight since last visit. He had lost a lot of weight from 238 to 170 in ~4-5 years, recently gained back.   He has numbness and tingling right leg and hand. He called me approximately 10 days ago to  inquire about Lyrica. I stopped his Neurontin at that time, and sent Lyrica to his pharmacy, instructing him to increase the dose slowly based on symptoms. I left Neurontin on his medication list, just in case he cannot obtain a cannot tolerate the Lyrica. He has good effect with Lyrica (50 mg) - took the burning away. He noticed some anxiety lately (he is off Klonopin) and wonders if this is from Lyrica.   He has retinopathy and experiences blurry vision in both eyes. His last eye exam was 2 months ago.   At last visit, I sent Rx for lancets and strips to his pharmacy and given him general instructions for DM2, proper foot care handout, hypoglycemia management handout and brochures about healthy eating, injection sites, sick day rules. He had the flu vaccine this season.   He is seen at [facility]. Off Klonopin now.  I reviewed pt's medications, allergies, PMH, social hx, family hx and no changes required exc.: He recently moved to a new appt.  Review of Systems Constitutional: no weight gain/loss, no fatigue, no subjective hyperthermia/hypothermia Eyes: no blurry vision, no xerophthalmia ENT: no sore throat, no nodules palpated in throat, no dysphagia/odynophagia, no hoarseness Cardiovascular: no CP/SOB/palpitations/leg swelling Respiratory: no cough/SOB Gastrointestinal: no N/+ V/D/C Musculoskeletal: + muscle/joint aches Skin: no rashes Neurological: no tremors/numbness/tingling/dizziness; HA Psychiatric: no  depression/anxiety Nocturia 2-3 times; low libido; difficulty with erections    Objective:   Physical Exam There were no vitals taken for this visit. Wt Readings from Last 3 Encounters:  12/01/12 183 lb 3.2 oz (83.099 kg)  11/02/12 188 lb (85.276 kg)  10/28/12 185 lb 1.3 oz (83.952 kg)   Constitutional: normal weight, in NAD Eyes: PERRLA, EOMI, no exophthalmos ENT: moist mucous membranes, no thyromegaly, no cervical lymphadenopathy Cardiovascular: RRR, No  MRG Respiratory: CTA B Gastrointestinal: abdomen soft, NT, ND, BS+ Musculoskeletal: no deformities, strength intact in all 4 Skin: moist, warm, no rashes Neurological: no tremor with outstretched hands, DTR normal in all 4     Assessment:     1. DM2, uncontrolled, insulin-dependent, with complications - retinopathy - neuropathy - long h/o noncompliance  2. Peripheral neuropathy    Plan:   1. DM2  -  Patient has slightly better control after becoming more compliant with his insulin regimen. However he tells me that he does not feel like taking 4 injections a day and he is open to switch to a less complicated regimen. - Since he is now taking 60 units a day total (Lantus 35 units and NovoLog 25 units usually once a day), I will start him on NovoLog 70/30 Flexpen 40 units in a.m. and 20 units with dinner. I hope that this would be approved by his insurance. He tells me that he has a good breakfast every day, however he grazes throughout the day afterwords. His dinner is not set certain times, and he can eat anywhere from 6 PM to 10 PM. I advised him to try to eat consistently before 7 PM. - I encouraged him to continue to check his sugars, and gave him new CBG logs - I encouraged him to call me or send me a message through my chart if his sugars are consistently in the 200 or if he has lows. - He will return in a month with his log - No labs for today  - I signed for his transportation forms today  2. Peripheral neuropathy - He is at the lower dose of Lyrica, with good results. His feet burning is now resolved, for which she is very happy about - I told him to stay at the lower dose if this helps his symptoms - Regarding his anxiety, I am not sure if this is caused by Lyrica, especially with his history of anxiety and depression and previous treatment with Klonopin. I advised him to stay on this low dose of Lyrica for now to give his body a chance to adapt to it. If he continues to have  the anxiety, then he will probably need anxiety medication, or even to come off Lyrica - For now will take Neurontin off his medication list

## 2012-12-04 NOTE — Patient Instructions (Addendum)
Please stop the Lantus and Humalog and start the 70/30 pen: - 40 units 15 min before breakfast - 20 units 15 min before dinner Try to have dinner no later than 7 pm Please let me know if your sugars are consistently >250 or <70. Please call me with any question or concerns: [phone].

## 2012-12-28 ENCOUNTER — Ambulatory Visit: Payer: Medicare Other | Admitting: Family

## 2012-12-28 ENCOUNTER — Encounter: Payer: 59 | Admitting: *Deleted

## 2012-12-29 ENCOUNTER — Encounter: Payer: Self-pay | Admitting: Family

## 2012-12-29 ENCOUNTER — Ambulatory Visit: Payer: 59 | Admitting: *Deleted

## 2012-12-29 ENCOUNTER — Ambulatory Visit (INDEPENDENT_AMBULATORY_CARE_PROVIDER_SITE_OTHER): Payer: 59 | Admitting: Family

## 2012-12-29 VITALS — BP 120/80 | HR 82 | Temp 98.4°F | Resp 16 | Ht 67.25 in | Wt 182.1 lb

## 2012-12-29 DIAGNOSIS — E11319 Type 2 diabetes mellitus with unspecified diabetic retinopathy without macular edema: Secondary | ICD-10-CM

## 2012-12-29 DIAGNOSIS — E1165 Type 2 diabetes mellitus with hyperglycemia: Secondary | ICD-10-CM

## 2012-12-29 DIAGNOSIS — M549 Dorsalgia, unspecified: Secondary | ICD-10-CM

## 2012-12-29 DIAGNOSIS — E1139 Type 2 diabetes mellitus with other diabetic ophthalmic complication: Secondary | ICD-10-CM

## 2012-12-29 DIAGNOSIS — G8929 Other chronic pain: Secondary | ICD-10-CM

## 2012-12-29 DIAGNOSIS — E291 Testicular hypofunction: Secondary | ICD-10-CM

## 2012-12-29 DIAGNOSIS — R7989 Other specified abnormal findings of blood chemistry: Secondary | ICD-10-CM

## 2012-12-29 DIAGNOSIS — M545 Low back pain: Secondary | ICD-10-CM

## 2012-12-29 MED ORDER — TESTOSTERONE 20.25 MG/1.25GM (1.62%) TD GEL: 4.0000 | Freq: Every day | TRANSDERMAL | Status: DC

## 2012-12-29 NOTE — Patient Instructions (Addendum)
Please complete your lab work prior to leaving. Follow up in 3 months. You will be contacted about your referral to pain management.

## 2012-12-29 NOTE — Progress Notes (Signed)
Subjective:    Patient ID: James Chang, male    DOB: Mar 23, 1966, 47 y.o.   MRN: [id number]  HPI  DM2-  Reports that his sugars are improving.  He saw Dr. Wyonia Hough.  She switched him from lantus to 70/30.  Has improved compliance.   Hyperlipidemia- Reports that he is not always taking statin.   Peripheral neuropathy- reports lyrica is helping.    Reports pneumovax given last year with his previous provider.     Review of Systems See hpi  Past Medical History  Diagnosis Date  . Depression   . Diabetes mellitus without complication   . Hypertension   . Hyperlipidemia   . Stroke 2008  . History of migraine   . Personal history of drug abuse     History   Social History  . Marital Status: Single    Spouse Name: N/A    Number of Children: 3  . Years of Education: N/A   Occupational History  . Not on file.   Social History Main Topics  . Smoking status: Never Smoker   . Smokeless tobacco: Never Used  . Alcohol Use: Yes     Comment: seldom 1-2 times a month beer  . Drug Use: Not on file  . Sexually Active: Not on file   Other Topics Concern  . Not on file   Social History Narrative   Disabled truck driver- since stroke.   Has 39 year old mother who is frail- he is now staying with her.   3 children- youngest is 60.   Single   Completed the 12th grade   Previous hx of crack cocaine abuse.  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          Past Surgical History  Procedure Laterality Date  . No past surgeries      Family History  Problem Relation Age of Onset  . Arthritis Mother   . Stroke Father   . Hypertension Father   . Kidney disease Father   . Heart attack Father   . Cancer Neg Hx   . Diabetes Neg Hx     No Known Allergies  Current Outpatient Prescriptions on File Prior to Visit  Medication Sig Dispense Refill  . ARIPiprazole (ABILIFY) 10 MG tablet Take 10 mg by mouth daily.      Marland Kitchen atorvastatin (LIPITOR) 10 MG tablet Take 10 mg by mouth daily.      Marland Kitchen  glucose blood (ONE TOUCH ULTRA TEST) test strip Use as instructed  100 each  12  . insulin aspart protamine-insulin aspart (NOVOLOG MIX 70/30 FLEXPEN) (70-30) 100 UNIT/ML injection Inject under skin 40 units 15 min before breakfast and 20 units 15 min before dinner  15 mL  11  . Lancets (ONETOUCH ULTRASOFT) lancets Use as instructed  100 each  12  . lisinopril (PRINIVIL,ZESTRIL) 10 MG tablet Take 10 mg by mouth daily.      . pregabalin (LYRICA) 50 MG capsule Start taking 1 capsule twice a day by mouth, increase by 1 capsule a week, to a dose of 2 capsules twice a day. If you still have pain on this dose, can increase to a maximum dose of 3 capsules twice a day. Stop Neurontin while on Pregabalin.  120 capsule  2  . insulin glargine (LANTUS) 100 UNIT/ML injection Inject 35 Units into the skin at bedtime.      . insulin lispro (HUMALOG KWIKPEN) 100 UNIT/ML injection Inject 20 Units into the skin 3 (three)  times daily before meals.  15 mL  11   No current facility-administered medications on file prior to visit.    BP 120/80  Pulse 82  Temp(Src) 98.4 F (36.9 C) (Oral)  Resp 16  Ht 5' 7.25" (1.708 m)  Wt 182 lb 1.3 oz (82.591 kg)  BMI 28.31 kg/m2  SpO2 99%       Objective:   Physical Exam  Constitutional: He is oriented to person, place, and time. He appears well-developed and well-nourished. No distress.  Cardiovascular: Normal rate and regular rhythm.   No murmur heard. Pulmonary/Chest: Effort normal and breath sounds normal. No respiratory distress. He has no wheezes. He has no rales. He exhibits no tenderness.  Musculoskeletal: He exhibits no edema.  Neurological: He is alert and oriented to person, place, and time.  Psychiatric: He has a normal mood and affect. His behavior is normal. Judgment and thought content normal.          Assessment & Plan:

## 2012-12-30 LAB — TESTOSTERONE, FREE, TOTAL, SHBG
Sex Hormone Binding: 22 nmol/L (ref 13–71)
Testosterone, Free: 57.1 pg/mL (ref 47.0–244.0)
Testosterone-% Free: 2.4 % (ref 1.6–2.9)
Testosterone: 239 ng/dL — ABNORMAL LOW (ref 300–890)

## 2012-12-30 NOTE — Assessment & Plan Note (Signed)
Has not been on testosterone in months. Requests refill on androgel. Check levels today.

## 2012-12-30 NOTE — Assessment & Plan Note (Signed)
Improving. This is being managed by endo.

## 2012-12-30 NOTE — Assessment & Plan Note (Signed)
Unchanged. Refer to pain management. 

## 2013-01-08 ENCOUNTER — Ambulatory Visit: Payer: 59 | Admitting: Internal Medicine

## 2013-01-11 ENCOUNTER — Telehealth: Payer: Self-pay | Admitting: *Deleted

## 2013-01-11 MED ORDER — MELOXICAM 7.5 MG PO TABS: 7.5000 mg | ORAL_TABLET | Freq: Every day | ORAL | Status: DC

## 2013-01-11 NOTE — Telephone Encounter (Addendum)
I am not comfortable prescribing him narcotics for his pain.  He can continue lyrica prescribed by Dr. Lafe Garin and add a short course of meloxicam.  I reviewed the knee brace order.  I do not have enough history in his chart re: knee pain to sign this form. We can talk more about it at his next visit.  We will work on the rx for his orthotics.

## 2013-01-11 NOTE — Telephone Encounter (Signed)
Patient called in inquiring about this. Best # [phone]

## 2013-01-11 NOTE — Telephone Encounter (Signed)
Received call from pt stating he has appt with pain management on 01/25/13 but he is having a lot of back pain. Pt is requesting rx for pain med until he is evaluated by pain management. Pt does not think pain management will prescribe anything for him at his first visit. Please advise  Also received order from [facility] for a knee orthosis. Pt states he spoke with provider about obtaining this.  Order forwarded to Provider for signature.

## 2013-01-11 NOTE — Telephone Encounter (Signed)
Notified pt. 

## 2013-01-13 ENCOUNTER — Ambulatory Visit: Payer: 59 | Admitting: Internal Medicine

## 2013-01-14 ENCOUNTER — Telehealth: Payer: Self-pay | Admitting: Family

## 2013-01-14 NOTE — Telephone Encounter (Signed)
Attempted to reach pt and received message that caller was unavailable and try again later.

## 2013-01-14 NOTE — Telephone Encounter (Signed)
It looks like Dr. Luan Pulling instructions said he could take up to 3 caps BID as needed.  He should speak with her if he needs refills on this medication.

## 2013-01-14 NOTE — Telephone Encounter (Signed)
Notified pt. He states that meloxicam is not helping his pain and requests something stronger. Pt requested to speak the Provider himself. Advised pt that Provider is out of the office this afternoon but has previously stated that she would not be able to prescribe anything as strong as narcotics for the pain. Advised pt to contact Dr Elvera Lennox if refill is needed for lyrica below. Pt voices understanding and still is not satisfied with instructions.

## 2013-01-14 NOTE — Telephone Encounter (Signed)
Patient states that he was prescribed lyrica 50mg  but would like melissa to up his dosage to 100mg . [facility].

## 2013-01-18 NOTE — Telephone Encounter (Signed)
Agree with instructions as below.

## 2013-01-20 ENCOUNTER — Telehealth: Payer: Self-pay | Admitting: *Deleted

## 2013-01-20 NOTE — Telephone Encounter (Signed)
Received fax from [facility] requesting signed order for foot orthotics (shoes and inserts). Form completed, signed and faxed to [phone] w/office notes. Ph) [phone].

## 2013-02-01 ENCOUNTER — Encounter: Payer: Self-pay | Admitting: Family

## 2013-02-01 ENCOUNTER — Ambulatory Visit (INDEPENDENT_AMBULATORY_CARE_PROVIDER_SITE_OTHER): Payer: 59 | Admitting: Family

## 2013-02-01 VITALS — BP 114/80 | HR 85 | Temp 98.4°F | Resp 20 | Ht 67.25 in | Wt 191.0 lb

## 2013-02-01 DIAGNOSIS — G8929 Other chronic pain: Secondary | ICD-10-CM

## 2013-02-01 DIAGNOSIS — E1165 Type 2 diabetes mellitus with hyperglycemia: Secondary | ICD-10-CM

## 2013-02-01 DIAGNOSIS — E291 Testicular hypofunction: Secondary | ICD-10-CM

## 2013-02-01 DIAGNOSIS — F329 Major depressive disorder, single episode, unspecified: Secondary | ICD-10-CM

## 2013-02-01 DIAGNOSIS — M545 Low back pain: Secondary | ICD-10-CM

## 2013-02-01 DIAGNOSIS — R7989 Other specified abnormal findings of blood chemistry: Secondary | ICD-10-CM

## 2013-02-01 DIAGNOSIS — E11319 Type 2 diabetes mellitus with unspecified diabetic retinopathy without macular edema: Secondary | ICD-10-CM

## 2013-02-01 DIAGNOSIS — I10 Essential (primary) hypertension: Secondary | ICD-10-CM

## 2013-02-01 DIAGNOSIS — E1139 Type 2 diabetes mellitus with other diabetic ophthalmic complication: Secondary | ICD-10-CM

## 2013-02-01 DIAGNOSIS — E785 Hyperlipidemia, unspecified: Secondary | ICD-10-CM

## 2013-02-01 LAB — CBC WITH DIFFERENTIAL/PLATELET
Basophils Absolute: 0 10*3/uL (ref 0.0–0.1)
Basophils Relative: 0 % (ref 0–1)
Eosinophils Absolute: 0.2 10*3/uL (ref 0.0–0.7)
Eosinophils Relative: 2 % (ref 0–5)
HCT: 43.2 % (ref 39.0–52.0)
Hemoglobin: 14.6 g/dL (ref 13.0–17.0)
Lymphocytes Relative: 19 % (ref 12–46)
Lymphs Abs: 1.4 10*3/uL (ref 0.7–4.0)
MCH: 28.1 pg (ref 26.0–34.0)
MCHC: 33.8 g/dL (ref 30.0–36.0)
MCV: 83.1 fL (ref 78.0–100.0)
Monocytes Absolute: 0.4 10*3/uL (ref 0.1–1.0)
Monocytes Relative: 5 % (ref 3–12)
Neutro Abs: 5.5 10*3/uL (ref 1.7–7.7)
Neutrophils Relative %: 74 % (ref 43–77)
Platelets: 273 10*3/uL (ref 150–400)
RBC: 5.2 MIL/uL (ref 4.22–5.81)
RDW: 14.1 % (ref 11.5–15.5)
WBC: 7.5 10*3/uL (ref 4.0–10.5)

## 2013-02-01 MED ORDER — OMEPRAZOLE 40 MG PO CPDR: 40.0000 mg | DELAYED_RELEASE_CAPSULE | Freq: Every day | ORAL | Status: DC

## 2013-02-01 MED ORDER — GABAPENTIN 100 MG PO CAPS: 100.0000 mg | ORAL_CAPSULE | Freq: Three times a day (TID) | ORAL | Status: DC

## 2013-02-01 NOTE — Patient Instructions (Addendum)
Please complete your lab work prior to leaving.  Keep upcoming appointments with Mental health and Endocrinology. Follow up in 3 months.

## 2013-02-01 NOTE — Progress Notes (Signed)
Subjective:    Patient ID: James Chang, male    DOB: 1966-02-11, 47 y.o.   MRN: [id number]  HPI  James Chang is a 47 yr old male who presents today for follow up.  1) DM2- He is following with Dr. Lafe Garin (endo) and has a follow up apt scheduled on 3/26.  Reports that his sugars have been very uncontrolled.  Reports that he has not been compliant with his insulin. He is having problem getting test strips.    2) HTN- currently maintained on lisinopril.    3) Chronic pain-Now in with pain management.  4) Depression-   Currently maintained on abilify. Reports that he was at [facility] in [facility]. He reports that behavioral health.  He has appointment with Mental health tomorrow.  Denies current SI or HI.    5) Low testosterone- last visit rx was given for androgel.  Will discuss with Dr. Wyonia Hough.    Review of Systems See HPI  Past Medical History  Diagnosis Date  . Depression   . Diabetes mellitus without complication   . Hypertension   . Hyperlipidemia   . Stroke 2008  . History of migraine   . Personal history of drug abuse     History   Social History  . Marital Status: Single    Spouse Name: N/A    Number of Children: 3  . Years of Education: N/A   Occupational History  . Not on file.   Social History Main Topics  . Smoking status: Never Smoker   . Smokeless tobacco: Never Used  . Alcohol Use: Yes     Comment: seldom 1-2 times a month beer  . Drug Use: Not on file  . Sexually Active: Not on file   Other Topics Concern  . Not on file   Social History Narrative   Disabled truck driver- since stroke.   Has 39 year old mother who is frail- he is now staying with her.   3 children- youngest is 28.   Single   Completed the 12th grade   Previous hx of crack cocaine abuse.  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          Past Surgical History  Procedure Laterality Date  . No past surgeries      Family History  Problem Relation Age of Onset  . Arthritis  Mother   . Stroke Father   . Hypertension Father   . Kidney disease Father   . Heart attack Father   . Cancer Neg Hx   . Diabetes Neg Hx     No Known Allergies  Current Outpatient Prescriptions on File Prior to Visit  Medication Sig Dispense Refill  . ARIPiprazole (ABILIFY) 10 MG tablet Take 10 mg by mouth daily.      Marland Kitchen atorvastatin (LIPITOR) 10 MG tablet Take 10 mg by mouth daily.      Marland Kitchen glucose blood (ONE TOUCH ULTRA TEST) test strip Use as instructed  100 each  12  . insulin aspart protamine-insulin aspart (NOVOLOG MIX 70/30 FLEXPEN) (70-30) 100 UNIT/ML injection Inject under skin 40 units 15 min before breakfast and 20 units 15 min before dinner  15 mL  11  . Lancets (ONETOUCH ULTRASOFT) lancets Use as instructed  100 each  12  . lisinopril (PRINIVIL,ZESTRIL) 10 MG tablet Take 10 mg by mouth daily.      . meloxicam (MOBIC) 7.5 MG tablet Take 1 tablet (7.5 mg total) by mouth daily.  10 tablet  0  . pregabalin (LYRICA) 50 MG capsule Start taking 1 capsule twice a day by mouth, increase by 1 capsule a week, to a dose of 2 capsules twice a day. If you still have pain on this dose, can increase to a maximum dose of 3 capsules twice a day. Stop Neurontin while on Pregabalin.  120 capsule  2  . Testosterone (ANDROGEL) 20.25 MG/1.25GM (1.62%) GEL Place 4 Act onto the skin daily.  1.25 g  0   No current facility-administered medications on file prior to visit.    BP 114/80  Pulse 85  Temp(Src) 98.4 F (36.9 C) (Oral)  Resp 20  Ht 5' 7.25" (1.708 m)  Wt 191 lb (86.637 kg)  BMI 29.7 kg/m2  SpO2 97%       Objective:   Physical Exam  Constitutional: He is oriented to person, place, and time. He appears well-developed and well-nourished. No distress.  HENT:  Head: Normocephalic and atraumatic.  Cardiovascular: Normal rate and regular rhythm.   No murmur heard. Pulmonary/Chest: Effort normal and breath sounds normal. No respiratory distress. He has no wheezes. He has no rales. He  exhibits no tenderness.  Musculoskeletal: He exhibits no edema.  Neurological: He is alert and oriented to person, place, and time.  Psychiatric: He has a normal mood and affect. His behavior is normal. Judgment and thought content normal.          Assessment & Plan:  Pt with known neuropathy of the LE, also noted to have callous formation on the feet.

## 2013-02-02 ENCOUNTER — Telehealth: Payer: Self-pay | Admitting: Family

## 2013-02-02 LAB — BASIC METABOLIC PANEL
BUN: 17 mg/dL (ref 6–23)
CO2: 27 mEq/L (ref 19–32)
Calcium: 10 mg/dL (ref 8.4–10.5)
Chloride: 97 mEq/L (ref 96–112)
Creat: 1.01 mg/dL (ref 0.50–1.35)
Glucose, Bld: 405 mg/dL — ABNORMAL HIGH (ref 70–99)
Potassium: 4.4 mEq/L (ref 3.5–5.3)
Sodium: 134 mEq/L — ABNORMAL LOW (ref 135–145)

## 2013-02-02 LAB — TESTOSTERONE: Testosterone: 323 ng/dL (ref 300–890)

## 2013-02-02 LAB — HEPATIC FUNCTION PANEL
ALT: 24 U/L (ref 0–53)
AST: 13 U/L (ref 0–37)
Albumin: 4.4 g/dL (ref 3.5–5.2)
Alkaline Phosphatase: 81 U/L (ref 39–117)
Bilirubin, Direct: 0.2 mg/dL (ref 0.0–0.3)
Indirect Bilirubin: 0.8 mg/dL (ref 0.0–0.9)
Total Bilirubin: 1 mg/dL (ref 0.3–1.2)
Total Protein: 7.1 g/dL (ref 6.0–8.3)

## 2013-02-02 LAB — HEMOGLOBIN A1C
Hgb A1c MFr Bld: 13.4 % — ABNORMAL HIGH (ref <5.7)
Mean Plasma Glucose: 338 mg/dL — ABNORMAL HIGH (ref <117)

## 2013-02-02 LAB — PSA: PSA: 0.34 ng/mL (ref <=4.00)

## 2013-02-02 NOTE — Telephone Encounter (Signed)
FYI

## 2013-02-02 NOTE — Telephone Encounter (Signed)
Pt is calling to say he needs MD to complete the paperwork for diabetic shoes and fax over to [facility] when completed. He was in yesterday and gave the paperwork to MD. Was calling back to say [facility] hadn't received them yet. Please call and notify pt when complete.

## 2013-02-03 ENCOUNTER — Ambulatory Visit (INDEPENDENT_AMBULATORY_CARE_PROVIDER_SITE_OTHER): Payer: 59 | Admitting: Internal Medicine

## 2013-02-03 ENCOUNTER — Encounter: Payer: Self-pay | Admitting: Family

## 2013-02-03 ENCOUNTER — Encounter: Payer: Self-pay | Admitting: Internal Medicine

## 2013-02-03 VITALS — BP 110/64 | HR 71 | Temp 98.1°F | Resp 10 | Wt 177.2 lb

## 2013-02-03 DIAGNOSIS — E1165 Type 2 diabetes mellitus with hyperglycemia: Secondary | ICD-10-CM

## 2013-02-03 DIAGNOSIS — E785 Hyperlipidemia, unspecified: Secondary | ICD-10-CM

## 2013-02-03 DIAGNOSIS — E1139 Type 2 diabetes mellitus with other diabetic ophthalmic complication: Secondary | ICD-10-CM

## 2013-02-03 DIAGNOSIS — E11319 Type 2 diabetes mellitus with unspecified diabetic retinopathy without macular edema: Secondary | ICD-10-CM

## 2013-02-03 LAB — LIPID PANEL
Cholesterol: 242 mg/dL — ABNORMAL HIGH (ref 0–200)
HDL: 80.3 mg/dL (ref >39.00)
Total CHOL/HDL Ratio: 3
Triglycerides: 86 mg/dL (ref 0.0–149.0)
VLDL: 17.2 mg/dL (ref 0.0–40.0)

## 2013-02-03 MED ORDER — GLUCOSE BLOOD VI STRP: ORAL_STRIP | Status: DC

## 2013-02-03 MED ORDER — ONETOUCH ULTRA SYSTEM W/DEVICE KIT: 1.0000 | PACK | Freq: Once | Status: DC

## 2013-02-03 NOTE — Progress Notes (Signed)
Subjective:     Patient ID: James Chang, male   DOB: [birth year], 47 y.o.   MRN: [id number]  HPI James Chang is a pleasant 47 y/o man returning for f/u for DM2, dx 2000, uncontrolled, insulin-dependent, c/w retinopathy and neuropathy.  Patient tells me that he could not check his sugars at least in the last week as he did not have strips. His One Touch ultra test strips are covered, but he tells me that they were sent by the mail-order pharmacy to somebody else in [place] (?). I did send him to [facility] at last visit... She also tells me that he is noncompliant with his insulin, taking it sporadically. At last visit, we switched to NovoLog 70/30 twice a day regimen, which is the simplest one that we can use for his very uncontrolled diabetes. He was admitted to the hospital (per history report, psychiatric admission), and his sugars are very high during the admission, in the 600s and 500's.   His last HbA1c: Lab Results  Component Value Date   HGBA1C 13.4* 02/01/2013   Last microalbumin over creatinine ratio was undetectable 2 months ago.  At last visit, a month and a half ago, he was checking his sugars 2x a day: - am: 214-250 - hS: 240-250 No lows. Highest: 260. This has worsened lately, however we do not have sugars for the last week, and he did not write them down before that. Last checked sugars: 1 week ago: lowest 319. He is only going to the restroom 3-4 x a night, now once a night.   He denied having problems with paying for the meds as they are covered by his insurance.   He is on Lyrica for peripheral neuropathy. He has numbness and tingling in the right leg and hand. He has retinopathy and experiences blurry vision in both eyes. His last eye exam was 2 months ago. He is seen at [facility]. Off Klonopin now. On Abilify.  The patient also has low total testosterone level, and he has been on testosterone treatment in the past, recently restarted on AndroGel by  PCP. Patient did not take it due to warnings that he heard on TV about AndroGel.  I reviewed pt's medications, allergies, PMH, social hx, family hx and no changes required.   Review of Systems Constitutional: + weight loss, no fatigue, no subjective hyperthermia/hypothermia, nocturia x1 Eyes: no blurry vision, no xerophthalmia ENT: no sore throat, no nodules palpated in throat, no dysphagia/odynophagia, no hoarseness Cardiovascular: no CP/SOB/palpitations/leg swelling Respiratory: no cough/SOB Gastrointestinal: + N/+ V/+ GERD/no D/C Musculoskeletal: has muscle/has joint aches  Skin: no rashes Neurological: no tremors/numbness/tingling/dizziness Psychiatric: no depression/anxiety  low libido    Objective:   Physical Exam BP 110/64  Pulse 71  Temp(Src) 98.1 F (36.7 C) (Oral)  Resp 10  Wt 177 lb 3.2 oz (80.377 kg)  BMI 27.55 kg/m2  SpO2 97% Wt Readings from Last 3 Encounters:  02/03/13 177 lb 3.2 oz (80.377 kg)  02/01/13 191 lb (86.637 kg)  12/29/12 182 lb 1.3 oz (82.591 kg)  Constitutional: slightly overweight, in NAD Eyes: PERRLA, EOMI, no exophthalmos ENT: moist mucous membranes, no thyromegaly, no cervical lymphadenopathy Cardiovascular: RRR, No MRG Respiratory: CTA B Gastrointestinal: abdomen soft, NT, ND, BS+ Musculoskeletal: no deformities, strength intact in all 4 Skin: moist, warm, no rashes Neurological: no tremor with outstretched hands, DTR normal in all 4    Assessment:     1. DM 2, uncontrolled, insulin-dependent, with complications - retinopathy -  neuropathy - long h/o noncompliance  2. Low testosterone level    Plan:     1. DM 2 - unfortunately, patient's diabetes became even worse controlled than before, even after switching to the simplest regimen available - We discussed about the fact that it is impossible to control his diabetes without him taking the insulin and checking his sugars - I would not change his insulin doses for now - I printed  prescriptions for One Touch ultra strips and a One Touch ultra meter and give him a prescription to take to his pharmacy - I gave him new sugar logs and advised him to check his sugars at least twice a day - I will see him in 2 weeks with his sugar log - per PCP's request, we'll check a cholesterol level today as he is fasting  2. Low testosterone level - per PCP's request, we also discussed about [name]' testosterone therapy at this visit. He has been on testosterone in the past, however did not take it in the last few months. A total Testosterone level was low, at 239 on 12/29/2012, but with a free testosterone within the normal limits, at 57 (47-244). He was started back on AndroGel, due to the low total testosterone value. He mentions that he did not restart the Androgel due to the warnings that he saw on TV regarding testosterone treatment. A repeat testosterone level was higher, at 323, 3 days ago. Since his free testosterone was normal at the last check last month, I would not recommend AndroGel, as his total testosterone is low due to his low SHBG, which in turn is caused by his poorly controlled diabetes, so I advised the patient that it is okay to stay off the medication.   Office Visit on 02/03/2013  Component Date Value Range Status  . Cholesterol 02/03/2013 242* 0 - 200 mg/dL Final   ATP III Classification       Desirable:  < 200 mg/dL               Borderline High:  200 - 239 mg/dL          High:  > = 161 mg/dL  . Triglycerides 02/03/2013 86.0  0.0 - 149.0 mg/dL Final   Normal:  <096 mg/dLBorderline High:  150 - 199 mg/dL  . HDL 02/03/2013 80.30  >39.00 mg/dL Final  . VLDL 04/54/0981 17.2  0.0 - 40.0 mg/dL Final  . Total CHOL/HDL Ratio 02/03/2013 3   Final                  Men          Women1/2 Average Risk     3.4          3.3Average Risk          5.0          4.42X Average Risk          9.6          7.13X Average Risk          15.0          11.0                      . Direct  LDL 02/03/2013 148.9   Final   Optimal:  <100 mg/dLNear or Above Optimal:  100-129 mg/dLBorderline High:  130-159 mg/dLHigh:  160-189 mg/dLVery High:  >190 mg/dL

## 2013-02-03 NOTE — Patient Instructions (Addendum)
Please try to check sugars twice a day and bring log at next appointment. Please return in 2 weeks. It is OK to stop Androgel.

## 2013-02-03 NOTE — Telephone Encounter (Signed)
Pt left another message requesting status of paperwork.  Please advise.

## 2013-02-04 ENCOUNTER — Encounter: Payer: Self-pay | Admitting: Internal Medicine

## 2013-02-04 ENCOUNTER — Encounter: Payer: Self-pay | Admitting: Family

## 2013-02-04 LAB — LDL CHOLESTEROL, DIRECT: Direct LDL: 148.9 mg/dL

## 2013-02-04 NOTE — Telephone Encounter (Signed)
I have completed my portion of paperwork, but it needs Dr. Mariel Aloe signature.

## 2013-02-04 NOTE — Telephone Encounter (Signed)
Paperwork faxed to [phone], notified pt.

## 2013-02-04 NOTE — Telephone Encounter (Signed)
Notified pt that paperwork is pending MD signature, should be signed today and I will call him once it has been faxed.

## 2013-02-04 NOTE — Telephone Encounter (Signed)
Patient called again regarding this. He would like a call back when this is complete.

## 2013-02-07 NOTE — Assessment & Plan Note (Signed)
Pt stopped androgel due to concern re: side effects.  I have advised him to discuss with Endo at his upcoming apt.

## 2013-02-07 NOTE — Assessment & Plan Note (Signed)
He is now being managed by pain management.

## 2013-02-07 NOTE — Assessment & Plan Note (Signed)
Non compliant, uncontrolled.  Followed by Endo.

## 2013-02-07 NOTE — Assessment & Plan Note (Signed)
BP Readings from Last 3 Encounters:  02/03/13 110/64  02/01/13 114/80  12/29/12 120/80   Stable on lisinopril, continue same. Obtain BMET.

## 2013-02-07 NOTE — Assessment & Plan Note (Signed)
To establish with behavioral health tomorrow.  I encouraged him to keep this upcoming apt. Cont abilify.

## 2013-02-09 ENCOUNTER — Telehealth: Payer: Self-pay | Admitting: *Deleted

## 2013-02-09 DIAGNOSIS — E785 Hyperlipidemia, unspecified: Secondary | ICD-10-CM

## 2013-02-09 NOTE — Telephone Encounter (Signed)
pls call pt and let him know that his cholesterol is high. i would like hi to increase atorvastatin to 20 mg. Repeat lft flp in 6 weeks. Dx is hyperlipidemia.

## 2013-02-09 NOTE — Telephone Encounter (Signed)
Received message from pt on Monday, 02/08/13 that [facility] did not receive order/papers from [place] for therapeutic shoes that I faxed to them on Friday, 02/04/13. Faxed paperwork again on 02/08/13 at 9:27am. Received another message from pt today, 02/09/13 stating [facility] still has not received our fax. Attempted to reach pt and left detailed message on home# that paperwork has been faxed twice and confirmation was received on our end. Advised pt to let us know if he wants to pick up a copy of the paperwork or does he have a different fax # we can try to send paperwork to? Awaiting response.

## 2013-02-09 NOTE — Telephone Encounter (Signed)
Message copied by Sandford Craze on Tue Feb 09, 2013  7:47 PM ------      Message from: Carlus Pavlov      Created: Thu Feb 04, 2013 11:53 AM       Efraim Kaufmann, here is Mr. Rodas lipid profile. I sent him a letter with the results but did not advise him to increase the Atorvastatin or not... ------

## 2013-02-10 NOTE — Telephone Encounter (Signed)
Left message on home # to return my call. 

## 2013-02-12 MED ORDER — ATORVASTATIN CALCIUM 20 MG PO TABS: 20.0000 mg | ORAL_TABLET | Freq: Every day | ORAL | Status: DC

## 2013-02-12 NOTE — Telephone Encounter (Signed)
Notified pt and he voices understanding. Future lab order entered. Pt also states that [facility] still has not received our paperwork. Forms have been faxed 2-3 times.  Attempted to reach [facility] and received voicemail. Left detailed message requesting confirmation that paperwork has not been received and advised them that I will mail the forms to them instead of continuing to refax paperwork.

## 2013-02-18 ENCOUNTER — Ambulatory Visit (INDEPENDENT_AMBULATORY_CARE_PROVIDER_SITE_OTHER): Payer: 59 | Admitting: Internal Medicine

## 2013-02-18 ENCOUNTER — Encounter: Payer: Self-pay | Admitting: Internal Medicine

## 2013-02-18 VITALS — BP 118/62 | HR 75 | Temp 98.1°F | Resp 10 | Wt 198.0 lb

## 2013-02-18 DIAGNOSIS — E11319 Type 2 diabetes mellitus with unspecified diabetic retinopathy without macular edema: Secondary | ICD-10-CM

## 2013-02-18 DIAGNOSIS — E1139 Type 2 diabetes mellitus with other diabetic ophthalmic complication: Secondary | ICD-10-CM

## 2013-02-18 DIAGNOSIS — E1165 Type 2 diabetes mellitus with hyperglycemia: Secondary | ICD-10-CM

## 2013-02-18 MED ORDER — INSULIN ASPART PROT & ASPART (70-30 MIX) 100 UNIT/ML ~~LOC~~ SUSP: SUBCUTANEOUS | Status: DC

## 2013-02-18 MED ORDER — GABAPENTIN 100 MG PO CAPS: 100.0000 mg | ORAL_CAPSULE | Freq: Three times a day (TID) | ORAL | Status: DC

## 2013-02-18 MED ORDER — MELOXICAM 7.5 MG PO TABS: 7.5000 mg | ORAL_TABLET | Freq: Every day | ORAL | Status: DC

## 2013-02-18 NOTE — Progress Notes (Signed)
Subjective:     Patient ID: James Chang, male   DOB: Jun 16, 1966, 47 y.o.   MRN: [id number]  HPI James Chang is a pleasant 47 y/o man returning for f/u for DM2, dx 2000, uncontrolled, insulin-dependent, c/w retinopathy and neuropathy.  Patient only checks his sugars about 6 times since last visit as he did not have strips. His One Touch ultra test strips are covered, but he tells me that they were sent by the mail-order pharmacy to somebody else in [place] (?). I did give him a prescription for new strips to take to [facility] at last visit but they could not feel them because the insurance does not cover new strips until beginning of next month. We called the pharmacy during this appointment and they suggested that the patient called the insurance and clarify.  At last visit, the patient was telling me that he was noncompliant with his insulin, taking it sporadically. Before last visit, he was admitted to the hospital (per history report, psychiatric admission), and his sugars are very high during the admission, in the 600s and 500's. At this visit, however, the patient tells me that he is taking all of his insulin doses. He is however taking the dinner time insulin approximately 2 hours after dinner.  His last HbA1c: Lab Results  Component Value Date   HGBA1C 13.4* 02/01/2013  Last microalbumin over creatinine ratio was undetectable 2 months ago.  Sugars:  - am: 1 sugar: 349 - before lunch: 380 and 376 - 2h post dinner: 402, 412, 416 He takes the insulin every day, does not skip doses: 40 units in am 15-20 min before b'fast and 25 units 2h after dinner.  No lows. Highest: 400's. No increased thirst or urination.He has blurry vision.  He denied having problems with paying for the meds as they are covered by his insurance.   He is on Lyrica for peripheral neuropathy. He needs a new refill. He has numbness and tingling in the right leg and hand. He has retinopathy and experiences  blurry vision in both eyes. His last eye exam was 2 months ago. He is seen at [facility]. Off Klonopin now. On Abilify.  I reviewed pt's medications, allergies, PMH, social hx, family hx and no changes required,  Except for new diagnosis of hyperlipidemia, atorvastatin dose increased to 20 mg daily.   Review of Systems Constitutional: no  weight loss, no fatigue, no subjective hyperthermia/hypothermia, nocturia x1 Eyes: no blurry vision, no xerophthalmia ENT: no sore throat, no nodules palpated in throat, no dysphagia/odynophagia, no hoarseness Cardiovascular: no CP/SOB/palpitations/leg swelling Respiratory: no cough/SOB Gastrointestinal: no nausea, vomiting/no D/C Musculoskeletal: has muscle/has joint aches  Skin: no rashes Neurological: no tremors/numbness/tingling/dizziness, but has headaches and tinnitus Psychiatric: no depression/anxiety  low libido    Objective:   Physical Exam BP 118/62  Pulse 75  Temp(Src) 98.1 F (36.7 C) (Oral)  Resp 10  Wt 198 lb (89.812 kg)  BMI 30.79 kg/m2  SpO2 96% Wt Readings from Last 3 Encounters:  02/18/13 198 lb (89.812 kg)  02/03/13 177 lb 3.2 oz (80.377 kg)  02/01/13 191 lb (86.637 kg)  Constitutional: slightly overweight, in NAD Eyes: PERRLA, EOMI, no exophthalmos ENT: moist mucous membranes, no thyromegaly, no cervical lymphadenopathy Cardiovascular: RRR, No MRG Respiratory: CTA B Gastrointestinal: abdomen soft, NT, ND, BS+ Musculoskeletal: no deformities, strength intact in all 4 Skin: moist, warm, no rashes Neurological: no tremor with outstretched hands, DTR normal in all 4    Assessment:  1. DM 2, uncontrolled, insulin-dependent, with complications - retinopathy - neuropathy - long h/o noncompliance    Plan:     1. DM 2 - unfortunately, patient's diabetes became even worse controlled than before, even after switching to the simplest regimen available - he admits to taking his insulin twice a day every day -  We are limited by the lack of blood sugars, since he did not have strips (Please see history of present illness) - however, the values that I seen him his log are between 300s and 400s, so I believe we can safely increase the insulin dose (since he is taking it every day) - 50 units in the morning and 35 units in p.m., but I advised him to take it 15-20 minutes before dinner not 2 hours after - I advised him to call the insurance and clarified the issue of the One Touch ultra strips. If he is to pay from the pocket, they cost $50 for 50 strips. The patient mentions that he cannot afford this. - I gave him new sugar logs and advised him to check his sugars at least twice a day - I again advised him to join my chart and to send me his sugars before our next appointment or call me with these - for his neuropathy, I refilled his Lyrica for 40 days (as required by the insurance), and also refilled his Mobic - I will see him in one month with his sugar log

## 2013-02-18 NOTE — Patient Instructions (Addendum)
Please return in 1 month with your sugar log.  Please increase the insulin to 50 units in am and 35 units in pm, before dinner. Please call your insurance to see if you can get new strips.

## 2013-03-04 ENCOUNTER — Telehealth: Payer: Self-pay | Admitting: *Deleted

## 2013-03-04 NOTE — Telephone Encounter (Signed)
Received faxes from [facility] requesting order for heating pad to treat arthritis pain. External vacuum erection device for ED and lumbar orthosis. Attempted to reach pt and verify requests. Home # is not in service and cell# is not accepting messages at this time. Will try back later.

## 2013-03-05 ENCOUNTER — Encounter: Payer: Self-pay | Admitting: *Deleted

## 2013-03-05 ENCOUNTER — Encounter: Payer: PRIVATE HEALTH INSURANCE | Attending: Internal Medicine | Admitting: *Deleted

## 2013-03-05 ENCOUNTER — Encounter: Payer: Self-pay | Admitting: Internal Medicine

## 2013-03-05 VITALS — Wt 194.9 lb

## 2013-03-05 DIAGNOSIS — E119 Type 2 diabetes mellitus without complications: Secondary | ICD-10-CM | POA: Insufficient documentation

## 2013-03-05 DIAGNOSIS — Z713 Dietary counseling and surveillance: Secondary | ICD-10-CM | POA: Insufficient documentation

## 2013-03-05 DIAGNOSIS — E11319 Type 2 diabetes mellitus with unspecified diabetic retinopathy without macular edema: Secondary | ICD-10-CM

## 2013-03-05 NOTE — Progress Notes (Signed)
  Medical Nutrition Therapy:  Appt start time: 0900 end time:  0945.  Assessment:  Primary concerns today: patient here for DM2 uncontrolled follow up visit. States AM BG now running around 230 but evenings are back up over 300 now that doses were increased on 4/10 to 50 units of Novolog 70/30 in AM and 35 units at night. States he is consistently SMBG twice a day, He does state he is now drinking Koolaid instead of regular soda but the sugar content is actually the same. He is eating more vegetables and lean meats now with less bread. He is expressing more desire to get his diabetes under control and appears willing to work with MD and educator to get his medication as well as his eating habits fine tuned to improve his overall management.  MEDICATIONS: see list Diabetes medication includes Novolog 70/30 twice a day   DIETARY INTAKE:  Usual eating pattern includes 3 meals and 3 or more snacks per day.  Everyday foods include mostly meats and starches, limited fruits and vegetables.  Avoided foods include none stated.    24-hr recall:  B ( AM): 2 sausage patties, 2 eggs, large bowl of grits, no more cup of fruit or toast at the same meal, 2-3 glasses water or lemonadeor sweet tea  Snk ( AM): variety of crackers, chips, occasionally fresh or canned fruit, cookies, candy, whatever he is hungry for  L ( PM): could be main meal: baked meat, starch and occasionally a vegetable OR Oodles of Noodles, lemonade or water Snk ( PM): same as AM D ( PM): cooks again, full meal as able Snk ( PM): sandwich or PNB crackers Beverages: Kool-aid, Lemonade or water   Usual physical activity: none due to neuropathy and back pain  Estimated energy needs: 2400 calories 270 g carbohydrates 180 g protein 67 g fat  Progress Towards Goal(s):  In progress.   Nutritional Diagnosis:  NB-1.1 Food and nutrition-related knowledge deficit As related to uncontrolled Diabetes.  As evidenced by A1c of 14.1% on  09/30/12.    Intervention:  Commended him on his improved frequencies of testing his BG and taking his insulin! Reviewed Carb Counting and benefit of aiming for 5 Carb choices per meal for more consistent BG management. Discusses options for replacing sweet Kool-aid and Lemonade with water or calorie free beverages. He stated he would give them a try. Also discussed rationale of checking his BG after exercise to evaluate how much that activity affected his BG.   Plan:  Continue to aim for 5 Carb Choices per meal (75 grams) +/- 1 either way  Continue to aim for 0-2 Carbs per snack if hungry  Consider reading food labels for Total Carbohydrate of foods (5 grams or less is free, every 15 grams is one Carb choice) Continue taking insulin (Novolog 70/30) more consistently before breakfast and pre-supper as directed by MD Consider switching to Zero beverages and Lite Lemonade. See if you like the Splenda?   Handouts given during visit include: Carb Counting and Food Label handouts Meal Plan Card  Monitoring/Evaluation:  Dietary intake, exercise, taking insulin more consistentlh, and body weight in 6 weeks. (His next appt. with Endo is in 3 weeks.)

## 2013-03-05 NOTE — Progress Notes (Unsigned)
Patient came by the office today and dropped her his sugar log for the last week, and his sugars are: - am: 229, 237, 229 - Before dinner: 243 - 2 hours after dinner: 293, 370 - Bed time: 384 This sugars are obtained in separate days, he checks once a day, rotating checktimes. This is an improvement from last time, when all his sugars were in the 300-400 range. He is on a regimen of NovoLog 70/30 insulin 50 units in a.m. and 35 units before dinner.  He also saw [name] with nutrition today, and they are working on improving his diet, for now cutting out his sugary drinks I believe this will greatly help.  I tried to call patient back at both his cell phone and home number, but I could not get in touch with him. I will retry. We will need to increase his dinner insulin.

## 2013-03-05 NOTE — Patient Instructions (Signed)
Plan:  Continue to aim for 5 Carb Choices per meal (75 grams) +/- 1 either way  Continue to aim for 0-2 Carbs per snack if hungry  Consider reading food labels for Total Carbohydrate of foods (5 grams or less is free, every 15 grams is one Carb choice) Continue taking insulin more consistently before meals and at 9 PM as directed by MD Consider switching to Zero beverages and Lite Lemonade. See if you like the Splenda?

## 2013-03-11 NOTE — Telephone Encounter (Signed)
Unable to reach pt or leave message at phone #s listed. Requests are being discarded.

## 2013-03-25 ENCOUNTER — Ambulatory Visit (INDEPENDENT_AMBULATORY_CARE_PROVIDER_SITE_OTHER): Payer: 59 | Admitting: Internal Medicine

## 2013-03-25 ENCOUNTER — Encounter: Payer: Self-pay | Admitting: Internal Medicine

## 2013-03-25 VITALS — BP 118/72 | HR 73 | Temp 98.1°F | Resp 10 | Ht 67.0 in | Wt 194.0 lb

## 2013-03-25 DIAGNOSIS — E11319 Type 2 diabetes mellitus with unspecified diabetic retinopathy without macular edema: Secondary | ICD-10-CM

## 2013-03-25 DIAGNOSIS — E1139 Type 2 diabetes mellitus with other diabetic ophthalmic complication: Secondary | ICD-10-CM

## 2013-03-25 MED ORDER — METFORMIN HCL 500 MG PO TABS: 1000.0000 mg | ORAL_TABLET | Freq: Two times a day (BID) | ORAL | Status: DC

## 2013-03-25 MED ORDER — INSULIN ASPART PROT & ASPART (70-30 MIX) 100 UNIT/ML ~~LOC~~ SUSP: SUBCUTANEOUS | Status: DC

## 2013-03-25 MED ORDER — GABAPENTIN 300 MG PO CAPS: 300.0000 mg | ORAL_CAPSULE | Freq: Three times a day (TID) | ORAL | Status: DC

## 2013-03-25 MED ORDER — METFORMIN HCL 500 MG PO TABS: 500.0000 mg | ORAL_TABLET | Freq: Two times a day (BID) | ORAL | Status: DC

## 2013-03-25 NOTE — Progress Notes (Signed)
Subjective:     Patient ID: James Chang, male   DOB: 1965-11-14, 47 y.o.   MRN: [id number]  HPI James Chang is a pleasant 47 y/o man returning for f/u for DM2, dx 2000, uncontrolled, insulin-dependent, c/w retinopathy and neuropathy.  His last HbA1c: Lab Results  Component Value Date   HGBA1C 13.4* 02/01/2013  Last microalbumin over creatinine ratio was undetectable 3 months ago.  At previous visits,  visit, the patient was telling me that he was noncompliant with his insulin, taking it sporadically, but he improved his compliance so that at last visit, he was taken the insulin consistently, although the second dose of the day was injected at 2h after dinner. I advised him to move it to 15-20 min before the meal. He started to do so. He tells me that he know misses ~one dose of insulin per week.  He is on a regimen of NovoLog 70/30 insulin 50 units in a.m. and 35 units before dinner.   I received pt's sugars on 03/04/2013 (reviewed documentation): Patient came by the office today and dropped her his sugar log for the last week, and his sugars are:  - am: 229, 237, 229  - Before dinner: 243  - 2 hours after dinner: 293, 370  - Bed time: 384  This sugars are obtained in separate days, he checks once a day, rotating checktimes. This is an improvement from last time, when all his sugars were in the 300-400 range. I tried to call patient back at both his cell phone and home number, but I could not get in touch with him to tell him to increase his dinner insulin. He tells me that he did not have a phone but he is getting one today and he'll let me know the number.  No increased thirst or urination.He has blurry vision. He uses a One Touch Ultra glucometer and had a lot of problems with obtaining strips, as apparently somebody else was getting them in his name. This problem solved by him calling the insurance.  He saw James Chang with nutrition about 3 weeks ago and they are working on  improving his diet, for now cutting out his sugary drinks I believe this will greatly help.   He was on Lyrica for peripheral neuropathy, but apparently this is not covered by his insurance anymore. He stopped taking it. He is taking his Neurontin at 300 mg twice a day. He has numbness and tingling in the right leg and hand. He has retinopathy and experiences blurry vision in both eyes. His last eye exam was 3 months ago. He did not go to pick up his new glasses yet. He is seen at [facility]. Off Klonopin now. On Abilify.  I reviewed pt's medications, allergies, PMH, social hx, family hx and no changes required, except he had to stop Lyrica - see above.   Review of Systems Constitutional: no  weight loss, no fatigue, no subjective hyperthermia/hypothermia, nocturia x1 Eyes: no blurry vision, no xerophthalmia ENT: no sore throat, no nodules palpated in throat, no dysphagia/odynophagia, no hoarseness Cardiovascular: no CP/SOB/palpitations/leg swelling Respiratory: no cough/SOB Gastrointestinal: +nausea, vomiting, and heartburn - yesterday and today Musculoskeletal: has muscle/has joint aches  Skin: no rashes Neurological: no tremors/numbness/tingling/dizziness, but has headaches Psychiatric: no depression/anxiety    Objective:   Physical Exam BP 118/72  Pulse 73  Temp(Src) 98.1 F (36.7 C) (Oral)  Resp 10  Ht 5\' 7"  (1.702 m)  Wt 194 lb (87.998 kg)  BMI 30.38 kg/m2  SpO2 96% Wt Readings from Last 3 Encounters:  03/25/13 194 lb (87.998 kg)  03/05/13 194 lb 14.4 oz (88.406 kg)  02/18/13 198 lb (89.812 kg)  Constitutional: slightly overweight, in NAD Eyes: PERRLA, EOMI, no exophthalmos ENT: moist mucous membranes, no thyromegaly, no cervical lymphadenopathy Cardiovascular: RRR, No MRG Respiratory: CTA B Gastrointestinal: abdomen soft, NT, ND, BS+ Musculoskeletal: no deformities, strength intact in all 4 Skin: moist, warm, no rashes Neurological: no tremor with  outstretched hands, DTR normal in all 4   a foot exam was performed today  Assessment:     1. DM 2, uncontrolled, insulin-dependent, with complications - retinopathy - neuropathy - long h/o noncompliance  2. Peripheral neuropathy    Plan:     1. DM 2 - patient diabetes improved recently do to his improved compliance with his insulin. He sent me sugars approximately 3 weeks ago and these showed that he is having the staircase effect, with increased sugars around dinnertime and at bedtime. I could not get in touch with him to advise him to increase the dinnertime insulin since he did not have a valid phone number. He is getting a new cell phone today, and he'll let me know the number. - At this visit, he tells me that he finally obtained new strips from the pharmacy 3 days ago. He will restart checking his sugars. - For now, going by the sugar log that he send me 3 weeks ago ,I advised him to increase her nighttime insulin from 35 to 45 units. For now, would maintain the a.m. Dose of 70/30 insulin at 50 units. - Also, reviewing his chart, I could not find the reason why patient is not on metformin and he could not remember why he was taken off. He has been on the medication but remembers that this was discontinued when he started insulin. He does not think that he had an intolerance to the. I reviewed his kidney function and his creatinine is less than 1.4. I advised him to start adding metformin slowly, and advance to target dose of 1000 mg twice a day. - Patient requires refills on his pants (he uses 10 pens a month). I discussed with him about maybe using bottles, but he does not like the needles on the insulin syringes.  2. Peripheral neuropathy - He tells me that his insurance does not cover Lyrica anymore. I advised him to increase the Neurontin from 300 mg 2  times a day to 300 mg 3 times a day - I sent a new prescription for a larger dose Neurontin (300 mg tablets) to his pharmacy  I  will see the patient back in one month with his sugar log.

## 2013-03-25 NOTE — Patient Instructions (Addendum)
Please increase dinnertime insulin to 45 units. Continue the 50 units in am. Please start Metformin 500 mg with dinner x 5 days. If you tolerate this well, add another Metformin tablet (500 mg) with breakfast x 5 days. If you tolerate this well, at another metformin tablet with dinner (1000 mg) x 5 days. If you tolerate this well, had another metformin tablets with breakfast (1000 mg). Continue with 1000 mg of metformin twice a day with breakfast and dinner. Please return in 1 month with your sugar log. You can take Neurontin 300 mg 3x a day. I sent a new prescription to the pharmacy for 300 mg tablets.

## 2013-03-29 ENCOUNTER — Ambulatory Visit (INDEPENDENT_AMBULATORY_CARE_PROVIDER_SITE_OTHER): Payer: 59 | Admitting: Family

## 2013-03-29 ENCOUNTER — Encounter: Payer: Self-pay | Admitting: Family

## 2013-03-29 VITALS — BP 110/80 | HR 74 | Temp 98.4°F | Resp 16 | Ht 67.25 in | Wt 194.0 lb

## 2013-03-29 DIAGNOSIS — I10 Essential (primary) hypertension: Secondary | ICD-10-CM

## 2013-03-29 DIAGNOSIS — E785 Hyperlipidemia, unspecified: Secondary | ICD-10-CM

## 2013-03-29 DIAGNOSIS — E1139 Type 2 diabetes mellitus with other diabetic ophthalmic complication: Secondary | ICD-10-CM

## 2013-03-29 DIAGNOSIS — E1165 Type 2 diabetes mellitus with hyperglycemia: Secondary | ICD-10-CM

## 2013-03-29 DIAGNOSIS — G8929 Other chronic pain: Secondary | ICD-10-CM

## 2013-03-29 DIAGNOSIS — E11319 Type 2 diabetes mellitus with unspecified diabetic retinopathy without macular edema: Secondary | ICD-10-CM

## 2013-03-29 DIAGNOSIS — M545 Low back pain: Secondary | ICD-10-CM

## 2013-03-29 LAB — LIPID PANEL
Cholesterol: 221 mg/dL — ABNORMAL HIGH (ref 0–200)
HDL: 61 mg/dL (ref >39)
LDL Cholesterol: 144 mg/dL — ABNORMAL HIGH (ref 0–99)
Total CHOL/HDL Ratio: 3.6 Ratio
Triglycerides: 81 mg/dL (ref <150)
VLDL: 16 mg/dL (ref 0–40)

## 2013-03-29 LAB — HEPATIC FUNCTION PANEL
ALT: 15 U/L (ref 0–53)
AST: 12 U/L (ref 0–37)
Albumin: 4.5 g/dL (ref 3.5–5.2)
Alkaline Phosphatase: 64 U/L (ref 39–117)
Bilirubin, Direct: 0.2 mg/dL (ref 0.0–0.3)
Indirect Bilirubin: 0.6 mg/dL (ref 0.0–0.9)
Total Bilirubin: 0.8 mg/dL (ref 0.3–1.2)
Total Protein: 7.1 g/dL (ref 6.0–8.3)

## 2013-03-29 NOTE — Assessment & Plan Note (Signed)
Tolerating statin, obtain flp/lft.  

## 2013-03-29 NOTE — Assessment & Plan Note (Signed)
I advised him that I would not treat his chronic back pain and recommended that he address his concerns: re epidural injections with pain management.

## 2013-03-29 NOTE — Patient Instructions (Addendum)
Please follow up in 3 months. Complete lab work prior to leaving.  

## 2013-03-29 NOTE — Assessment & Plan Note (Signed)
management per endo

## 2013-03-29 NOTE — Assessment & Plan Note (Signed)
BP stable on lisinopril. Continue same.  

## 2013-03-29 NOTE — Progress Notes (Signed)
Subjective:    Patient ID: James Chang, male    DOB: [birth year], 47 y.o.   MRN: [id number]  HPI  James Chang is a 47 yr old male who presents today for follow up.  1) HTN- he is currently maintained on lisinopril.  Denies CP, SOB or LE edema.  2) DM2- recently saw Dr. Lafe Garin.  He was started on Metformin.    3) Back pain- following with pain management who has recommended spinal injections. He is requesting med for back pain.     4) Hyperlipidemia- atorvastatin was increased from 10mg  to 20mg  in early April.  Denies myalgia.    Review of Systems    see HPI Past Medical History  Diagnosis Date  . Depression   . Diabetes mellitus without complication   . Hypertension   . Hyperlipidemia   . Stroke 2008  . History of migraine   . Personal history of drug abuse     History   Social History  . Marital Status: Single    Spouse Name: N/A    Number of Children: 3  . Years of Education: N/A   Occupational History  . Not on file.   Social History Main Topics  . Smoking status: Never Smoker   . Smokeless tobacco: Never Used  . Alcohol Use: Yes     Comment: seldom 1-2 times a month beer  . Drug Use: Not on file  . Sexually Active: Not on file   Other Topics Concern  . Not on file   Social History Narrative   Disabled truck driver- since stroke.   Has 31 year old mother who is frail- he is now staying with her.   3 children- youngest is 33.   Single   Completed the 12th grade   Previous hx of crack cocaine abuse.  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          Past Surgical History  Procedure Laterality Date  . No past surgeries      Family History  Problem Relation Age of Onset  . Arthritis Mother   . Stroke Father   . Hypertension Father   . Kidney disease Father   . Heart attack Father   . Cancer Neg Hx   . Diabetes Neg Hx     No Known Allergies  Current Outpatient Prescriptions on File Prior to Visit  Medication Sig Dispense Refill  . ARIPiprazole  (ABILIFY) 10 MG tablet Take 10 mg by mouth daily.      Marland Kitchen atorvastatin (LIPITOR) 20 MG tablet Take 1 tablet (20 mg total) by mouth daily.  30 tablet  3  . Blood Glucose Monitoring Suppl (ONE TOUCH ULTRA SYSTEM KIT) W/DEVICE KIT 1 kit by Does not apply route once.  1 each  0  . gabapentin (NEURONTIN) 300 MG capsule Take 1 capsule (300 mg total) by mouth 3 (three) times daily.  180 capsule  3  . glucose blood (ONE TOUCH ULTRA TEST) test strip Use as instructed  100 each  12  . insulin aspart protamine- aspart (NOVOLOG 70/30) (70-30) 100 UNIT/ML injection Inject under skin 50 units 15 min before breakfast and 45 units 15 min before dinner  30 mL  11  . Lancets (ONETOUCH ULTRASOFT) lancets Use as instructed  100 each  12  . lisinopril (PRINIVIL,ZESTRIL) 10 MG tablet Take 10 mg by mouth daily.      . meloxicam (MOBIC) 7.5 MG tablet Take 1 tablet (7.5 mg total) by mouth daily.  30 tablet  3  . metFORMIN (GLUCOPHAGE) 500 MG tablet Take 2 tablets (1,000 mg total) by mouth 2 (two) times daily with a meal.  120 tablet  3  . omeprazole (PRILOSEC) 40 MG capsule Take 1 capsule (40 mg total) by mouth daily.  30 capsule  3  . Testosterone (ANDROGEL) 20.25 MG/1.25GM (1.62%) GEL Place 4 Act onto the skin daily.  1.25 g  0   No current facility-administered medications on file prior to visit.    BP 110/80  Pulse 74  Temp(Src) 98.4 F (36.9 C) (Oral)  Resp 16  Ht 5' 7.25" (1.708 m)  Wt 194 lb (87.998 kg)  BMI 30.16 kg/m2  SpO2 99%    Objective:   Physical Exam  Constitutional: He appears well-developed and well-nourished. No distress.  HENT:  Head: Normocephalic and atraumatic.  Cardiovascular: Normal rate and regular rhythm.   No murmur heard. Pulmonary/Chest: Effort normal and breath sounds normal. No respiratory distress. He has no wheezes. He has no rales. He exhibits no tenderness.  Musculoskeletal: He exhibits no edema.  Neurological: He is alert.  Psychiatric: His behavior is normal.  Judgment and thought content normal.  Flat affect          Assessment & Plan:

## 2013-04-07 ENCOUNTER — Telehealth: Payer: Self-pay | Admitting: Family

## 2013-04-07 DIAGNOSIS — G894 Chronic pain syndrome: Secondary | ICD-10-CM

## 2013-04-07 NOTE — Telephone Encounter (Signed)
Please advise 

## 2013-04-07 NOTE — Telephone Encounter (Signed)
He is seeing Dr. Jordan Likes- who is pain management.  Would he like to be referred to someone different?

## 2013-04-07 NOTE — Telephone Encounter (Signed)
Patient would like to be referred to pain mgmt

## 2013-04-08 NOTE — Telephone Encounter (Signed)
Spoke with pt, he states his friend told him about Dr Mayford Knife at [phone] on [address] that she sees for pain management.  He states they told him he could be seen without a referral. Pt is going to try this doctor first. I searched for this doctor's listing and he is at an urgent care facility. I called pt back and advised him this doctor is urgent care and he states he was unaware of this. He reports that he has already signed a records release with Dr Cyndia Diver office and he would like to continue with a pain management doctor.  Please advise.

## 2013-04-16 ENCOUNTER — Ambulatory Visit: Payer: 59 | Admitting: *Deleted

## 2013-04-17 DIAGNOSIS — F259 Schizoaffective disorder, unspecified: Secondary | ICD-10-CM | POA: Insufficient documentation

## 2013-04-21 ENCOUNTER — Telehealth: Payer: Self-pay | Admitting: Family

## 2013-04-21 NOTE — Telephone Encounter (Signed)
Please call pt and let him know that Dr. Bryson Dames office declines to take him as a patient.  Is there someone else he would like [place] to try?

## 2013-04-22 NOTE — Telephone Encounter (Signed)
Spoke with pt. He will let us know,

## 2013-04-27 ENCOUNTER — Ambulatory Visit: Payer: 59 | Admitting: Internal Medicine

## 2013-04-27 DIAGNOSIS — Z0289 Encounter for other administrative examinations: Secondary | ICD-10-CM

## 2013-05-03 ENCOUNTER — Ambulatory Visit: Payer: 59 | Admitting: Family

## 2013-05-19 ENCOUNTER — Other Ambulatory Visit: Payer: Self-pay | Admitting: Orthopaedic Surgery

## 2013-05-19 DIAGNOSIS — M545 Low back pain: Secondary | ICD-10-CM

## 2013-05-19 DIAGNOSIS — M5410 Radiculopathy, site unspecified: Secondary | ICD-10-CM

## 2013-05-20 ENCOUNTER — Other Ambulatory Visit: Payer: Self-pay

## 2013-05-26 ENCOUNTER — Ambulatory Visit: Payer: 59 | Admitting: Family

## 2013-05-26 DIAGNOSIS — Z0289 Encounter for other administrative examinations: Secondary | ICD-10-CM

## 2013-05-27 ENCOUNTER — Ambulatory Visit
Admission: RE | Admit: 2013-05-27 | Discharge: 2013-05-27 | Disposition: A | Discharge status: Home / Self Care | Payer: Medicare Other | Source: Ambulatory Visit | Attending: Orthopaedic Surgery | Admitting: Orthopaedic Surgery

## 2013-05-27 DIAGNOSIS — M545 Low back pain: Secondary | ICD-10-CM

## 2013-05-27 DIAGNOSIS — M5410 Radiculopathy, site unspecified: Secondary | ICD-10-CM

## 2013-06-22 ENCOUNTER — Telehealth: Payer: Self-pay | Admitting: *Deleted

## 2013-06-22 NOTE — Telephone Encounter (Signed)
Received fax from [facility] requesting order for pt's diabetic testing supplies. Attemtped to reach pt to verify that he is requesting order and left message on cell# to return my call.

## 2013-06-24 NOTE — Telephone Encounter (Signed)
Unable to reach pt. Request discarded.

## 2013-06-29 ENCOUNTER — Ambulatory Visit: Payer: 59 | Admitting: Family

## 2013-11-22 ENCOUNTER — Telehealth: Payer: Self-pay | Admitting: Internal Medicine

## 2013-11-22 ENCOUNTER — Other Ambulatory Visit: Payer: Self-pay | Admitting: *Deleted

## 2013-11-22 NOTE — Telephone Encounter (Signed)
Opened encounter in error  

## 2013-11-22 NOTE — Telephone Encounter (Signed)
Lorene DyChristie calling about needles for pt. Message from Call a Nurse.

## 2013-12-13 ENCOUNTER — Other Ambulatory Visit: Payer: Self-pay | Admitting: *Deleted

## 2013-12-13 MED ORDER — "INSULIN SYRINGE-NEEDLE U-100 31G X 5/16"" 1 ML MISC": Status: DC

## 2013-12-16 DIAGNOSIS — K219 Gastro-esophageal reflux disease without esophagitis: Secondary | ICD-10-CM | POA: Insufficient documentation

## 2014-02-21 ENCOUNTER — Other Ambulatory Visit: Payer: Self-pay | Admitting: Internal Medicine

## 2014-04-05 ENCOUNTER — Other Ambulatory Visit: Payer: Self-pay | Admitting: Internal Medicine

## 2014-08-18 DIAGNOSIS — E1142 Type 2 diabetes mellitus with diabetic polyneuropathy: Secondary | ICD-10-CM | POA: Insufficient documentation

## 2014-08-18 DIAGNOSIS — G894 Chronic pain syndrome: Secondary | ICD-10-CM | POA: Insufficient documentation

## 2014-11-15 DIAGNOSIS — M4726 Other spondylosis with radiculopathy, lumbar region: Secondary | ICD-10-CM | POA: Insufficient documentation

## 2014-12-30 ENCOUNTER — Other Ambulatory Visit: Payer: Self-pay | Admitting: Internal Medicine

## 2015-04-25 ENCOUNTER — Other Ambulatory Visit: Payer: Self-pay | Admitting: Internal Medicine

## 2015-07-11 ENCOUNTER — Encounter: Payer: Self-pay | Admitting: Emergency Medicine

## 2015-07-11 ENCOUNTER — Emergency Department
Admission: EM | Admit: 2015-07-11 | Discharge: 2015-07-11 | Disposition: A | Discharge status: Home / Self Care | Payer: Medicare Other | Attending: Emergency Medicine | Admitting: Emergency Medicine

## 2015-07-11 DIAGNOSIS — Z794 Long term (current) use of insulin: Secondary | ICD-10-CM | POA: Insufficient documentation

## 2015-07-11 DIAGNOSIS — Z79899 Other long term (current) drug therapy: Secondary | ICD-10-CM | POA: Diagnosis not present

## 2015-07-11 DIAGNOSIS — E1165 Type 2 diabetes mellitus with hyperglycemia: Secondary | ICD-10-CM | POA: Diagnosis not present

## 2015-07-11 DIAGNOSIS — G8929 Other chronic pain: Secondary | ICD-10-CM | POA: Insufficient documentation

## 2015-07-11 DIAGNOSIS — R51 Headache: Secondary | ICD-10-CM | POA: Diagnosis present

## 2015-07-11 DIAGNOSIS — M545 Low back pain, unspecified: Secondary | ICD-10-CM

## 2015-07-11 DIAGNOSIS — R739 Hyperglycemia, unspecified: Secondary | ICD-10-CM

## 2015-07-11 DIAGNOSIS — I10 Essential (primary) hypertension: Secondary | ICD-10-CM | POA: Diagnosis not present

## 2015-07-11 DIAGNOSIS — Z79811 Long term (current) use of aromatase inhibitors: Secondary | ICD-10-CM | POA: Diagnosis not present

## 2015-07-11 DIAGNOSIS — Z791 Long term (current) use of non-steroidal anti-inflammatories (NSAID): Secondary | ICD-10-CM | POA: Insufficient documentation

## 2015-07-11 LAB — GLUCOSE, CAPILLARY
Glucose-Capillary: 495 mg/dL — ABNORMAL HIGH (ref 65–99)
Glucose-Capillary: 507 mg/dL — ABNORMAL HIGH (ref 65–99)

## 2015-07-11 MED ORDER — METOCLOPRAMIDE HCL 10 MG PO TABS
20.0000 mg | ORAL_TABLET | Freq: Once | ORAL | Status: AC
  Administered 2015-07-11: 20 mg via ORAL
  Filled 2015-07-11: qty 2

## 2015-07-11 MED ORDER — DIPHENHYDRAMINE HCL 50 MG PO CAPS
50.0000 mg | ORAL_CAPSULE | Freq: Once | ORAL | Status: AC
  Administered 2015-07-11: 50 mg via ORAL
  Filled 2015-07-11: qty 1

## 2015-07-11 NOTE — ED Notes (Signed)
Patient states he has been diagnosed with diabetes 16 years ago. Insulin dependent X 8 years. Patient states he has not taken insulin X 6-7 mos. Supposed to take 60 units of Lantus at night. Woke up this AM with headache and weakness. Patient states his blood sugar "is never under 250" Frequency of urination.

## 2015-07-11 NOTE — ED Notes (Signed)
Pt presents with headache and has not been taking his diabetic medications. Pt is resident of  [facility]. Pt is currently awaiting for his medications. edp at bedside.

## 2015-07-11 NOTE — Discharge Instructions (Signed)
Back Pain, Adult °Back pain is very common. The pain often gets better over time. The cause of back pain is usually not dangerous. Most people can learn to manage their back pain on their own.  °HOME CARE  °· Stay active. Start with short walks on flat ground if you can. Try to walk farther each day. °· Do not sit, drive, or stand in one place for more than 30 minutes. Do not stay in bed. °· Do not avoid exercise or work. Activity can help your back heal faster. °· Be careful when you bend or lift an object. Bend at your knees, keep the object close to you, and do not twist. °· Sleep on a firm mattress. Lie on your side, and bend your knees. If you lie on your back, put a pillow under your knees. °· Only take medicines as told by your doctor. °· Put ice on the injured area. °¨ Put ice in a plastic bag. °¨ Place a towel between your skin and the bag. °¨ Leave the ice on for 15-20 minutes, 03-04 times a day for the first 2 to 3 days. After that, you can switch between ice and heat packs. °· Ask your doctor about back exercises or massage. °· Avoid feeling anxious or stressed. Find good ways to deal with stress, such as exercise. °GET HELP RIGHT AWAY IF:  °· Your pain does not go away with rest or medicine. °· Your pain does not go away in 1 week. °· You have new problems. °· You do not feel well. °· The pain spreads into your legs. °· You cannot control when you poop (bowel movement) or pee (urinate). °· Your arms or legs feel weak or lose feeling (numbness). °· You feel sick to your stomach (nauseous) or throw up (vomit). °· You have belly (abdominal) pain. °· You feel like you may pass out (faint). °MAKE SURE YOU:  °· Understand these instructions. °· Will watch your condition. °· Will get help right away if you are not doing well or get worse. °Document Released: 04/15/2008 Document Revised: 01/20/2012 Document Reviewed: 03/01/2014 °[employer]® Patient Information ©2015 [employer]. This information is not intended  to replace advice given to you by your health care provider. Make sure you discuss any questions you have with your health care provider. ° °Chronic Pain °Chronic pain can be defined as pain that is off and on and lasts for 3-6 months or longer. Many things cause chronic pain, which can make it difficult to make a diagnosis. There are many treatment options available for chronic pain. However, finding a treatment that works well for you may require trying various approaches until the right one is found. Many people benefit from a combination of two or more types of treatment to control their pain. °SYMPTOMS  °Chronic pain can occur anywhere in the body and can range from mild to very severe. Some types of chronic pain include: °· Headache. °· Low back pain. °· Cancer pain. °· Arthritis pain. °· Neurogenic pain. This is pain resulting from damage to nerves. ° People with chronic pain may also have other symptoms such as: °· Depression. °· Anger. °· Insomnia. °· Anxiety. °DIAGNOSIS  °Your health care provider will help diagnose your condition over time. In many cases, the initial focus will be on excluding possible conditions that could be causing the pain. Depending on your symptoms, your health care provider may order tests to diagnose your condition. Some of these tests may include:  °·   Blood tests.   CT scan.   MRI.   X-rays.   Ultrasounds.   Nerve conduction studies.  You may need to see a specialist.  TREATMENT  Finding treatment that works well may take time. You may be referred to a pain specialist. He or she may prescribe medicine or therapies, such as:   Mindful meditation or yoga.  Shots (injections) of numbing or pain-relieving medicines into the spine or area of pain.  Local electrical stimulation.  Acupuncture.   Massage therapy.   Aroma, color, light, or sound therapy.   Biofeedback.   Working with a physical therapist to keep from getting stiff.   Regular,  gentle exercise.   Cognitive or behavioral therapy.   Group support.  Sometimes, surgery may be recommended.  HOME CARE INSTRUCTIONS   Take all medicines as directed by your health care provider.   Lessen stress in your life by relaxing and doing things such as listening to calming music.   Exercise or be active as directed by your health care provider.   Eat a healthy diet and include things such as vegetables, fruits, fish, and lean meats in your diet.   Keep all follow-up appointments with your health care provider.   Attend a support group with others suffering from chronic pain. SEEK MEDICAL CARE IF:   Your pain gets worse.   You develop a new pain that was not there before.   You cannot tolerate medicines given to you by your health care provider.   You have new symptoms since your last visit with your health care provider.  SEEK IMMEDIATE MEDICAL CARE IF:   You feel weak.   You have decreased sensation or numbness.   You lose control of bowel or bladder function.   Your pain suddenly gets much worse.   You develop shaking.  You develop chills.  You develop confusion.  You develop chest pain.  You develop shortness of breath.  MAKE SURE YOU:  Understand these instructions.  Will watch your condition.  Will get help right away if you are not doing well or get worse. Document Released: 07/20/2002 Document Revised: 06/30/2013 Document Reviewed: 04/23/2013 [facility] Patient Information 2015 [place], [place]. This information is not intended to replace advice given to you by your health care provider. Make sure you discuss any questions you have with your health care provider.  Hyperglycemia Hyperglycemia occurs when the glucose (sugar) in your blood is too high. Hyperglycemia can happen for many reasons, but it most often happens to people who do not know they have diabetes or are not managing their diabetes properly.  CAUSES  Whether you  have diabetes or not, there are other causes of hyperglycemia. Hyperglycemia can occur when you have diabetes, but it can also occur in other situations that you might not be as aware of, such as: Diabetes  If you have diabetes and are having problems controlling your blood glucose, hyperglycemia could occur because of some of the following reasons:  Not following your meal plan.  Not taking your diabetes medications or not taking it properly.  Exercising less or doing less activity than you normally do.  Being sick. Pre-diabetes  This cannot be ignored. Before people develop Type 2 diabetes, they almost always have "pre-diabetes." This is when your blood glucose levels are higher than normal, but not yet high enough to be diagnosed as diabetes. Research has shown that some long-term damage to the body, especially the heart and circulatory system, may already  be occurring during pre-diabetes. If you take action to manage your blood glucose when you have pre-diabetes, you may delay or prevent Type 2 diabetes from developing. Stress  If you have diabetes, you may be "diet" controlled or on oral medications or insulin to control your diabetes. However, you may find that your blood glucose is higher than usual in the hospital whether you have diabetes or not. This is often referred to as "stress hyperglycemia." Stress can elevate your blood glucose. This happens because of hormones put out by the body during times of stress. If stress has been the cause of your high blood glucose, it can be followed regularly by your caregiver. That way he/she can make sure your hyperglycemia does not continue to get worse or progress to diabetes. Steroids  Steroids are medications that act on the infection fighting system (immune system) to block inflammation or infection. One side effect can be a rise in blood glucose. Most people can produce enough extra insulin to allow for this rise, but for those who cannot,  steroids make blood glucose levels go even higher. It is not unusual for steroid treatments to "uncover" diabetes that is developing. It is not always possible to determine if the hyperglycemia will go away after the steroids are stopped. A special blood test called an A1c is sometimes done to determine if your blood glucose was elevated before the steroids were started. SYMPTOMS  Thirsty.  Frequent urination.  Dry mouth.  Blurred vision.  Tired or fatigue.  Weakness.  Sleepy.  Tingling in feet or leg. DIAGNOSIS  Diagnosis is made by monitoring blood glucose in one or all of the following ways:  A1c test. This is a chemical found in your blood.  Fingerstick blood glucose monitoring.  Laboratory results. TREATMENT  First, knowing the cause of the hyperglycemia is important before the hyperglycemia can be treated. Treatment may include, but is not be limited to:  Education.  Change or adjustment in medications.  Change or adjustment in meal plan.  Treatment for an illness, infection, etc.  More frequent blood glucose monitoring.  Change in exercise plan.  Decreasing or stopping steroids.  Lifestyle changes. HOME CARE INSTRUCTIONS   Test your blood glucose as directed.  Exercise regularly. Your caregiver will give you instructions about exercise. Pre-diabetes or diabetes which comes on with stress is helped by exercising.  Eat wholesome, balanced meals. Eat often and at regular, fixed times. Your caregiver or nutritionist will give you a meal plan to guide your sugar intake.  Being at an ideal weight is important. If needed, losing as little as 10 to 15 pounds may help improve blood glucose levels. SEEK MEDICAL CARE IF:   You have questions about medicine, activity, or diet.  You continue to have symptoms (problems such as increased thirst, urination, or weight gain). SEEK IMMEDIATE MEDICAL CARE IF:   You are vomiting or have diarrhea.  Your breath smells  fruity.  You are breathing faster or slower.  You are very sleepy or incoherent.  You have numbness, tingling, or pain in your feet or hands.  You have chest pain.  Your symptoms get worse even though you have been following your caregiver's orders.  If you have any other questions or concerns. Document Released: 04/23/2001 Document Revised: 01/20/2012 Document Reviewed: 02/24/2012 [facility] Patient Information 2015 [place], [place]. This information is not intended to replace advice given to you by your health care provider. Make sure you discuss any questions you have with your  health care provider. ° °

## 2015-07-11 NOTE — ED Provider Notes (Signed)
[facility] Emergency Department Provider Note  ____________________________________________  Time seen: 9:20 AM  I have reviewed the triage vital signs and the nursing notes.   HISTORY  Chief Complaint Headache    HPI James Chang is a 49 y.o. male with chronic diabetes who is noncompliant with his medical therapy who comes to the ED today requesting pain medicine for his chronic low back pain. He has been referred to pain management clinic through [facility] primary care 2 weeks ago. Review of the records that show that his blood sugar is always 400-500 with a hemoglobin A1c of 14..in addition to the chronic low back pain that is not changed, he also reports a headache for the last few days. It's diffuse and consistent with prior headaches. He denies any chest pain shortness of breath nausea vomiting diarrhea abdominal pain fevers or chills. No recent trauma. No vision changes neck stiffness numbness tingling or weakness.     Past Medical History  Diagnosis Date  . Depression   . Diabetes mellitus without complication   . Hypertension   . Hyperlipidemia   . Stroke 2008  . History of migraine   . Personal history of drug abuse      Patient Active Problem List   Diagnosis Date Noted  . Peripheral neuropathy 11/24/2012  . Type 2 diabetes, uncontrolled, with retinopathy 09/30/2012  . Hyperlipidemia 09/30/2012  . Chronic low back pain 09/30/2012  . Low testosterone 09/30/2012  . Depression 09/30/2012  . History of stroke 09/30/2012  . HTN (hypertension) 09/30/2012     Past Surgical History  Procedure Laterality Date  . No past surgeries       Current Outpatient Rx  Name  Route  Sig  Dispense  Refill  . ARIPiprazole (ABILIFY) 10 MG tablet   Oral   Take 10 mg by mouth daily.         Marland Kitchen atorvastatin (LIPITOR) 20 MG tablet   Oral   Take 1 tablet (20 mg total) by mouth daily.   30 tablet   3   . B-D ULTRAFINE III SHORT PEN 31G X 8 MM MISC      AS DIRECTED   100 each   4   . Blood Glucose Monitoring Suppl (ONE TOUCH ULTRA SYSTEM KIT) W/DEVICE KIT   Does not apply   1 kit by Does not apply route once.   1 each   0   . gabapentin (NEURONTIN) 300 MG capsule   Oral   Take 1 capsule (300 mg total) by mouth 3 (three) times daily.   180 capsule   3   . glucose blood (ONE TOUCH ULTRA TEST) test strip      Use as instructed   100 each   12   . insulin aspart protamine- aspart (NOVOLOG 70/30) (70-30) 100 UNIT/ML injection      Inject under skin 50 units 15 min before breakfast and 45 units 15 min before dinner   30 mL   11   . Insulin Syringe-Needle U-100 (BD INSULIN SYRINGE ULTRAFINE) 31G X 5/16" 1 ML MISC      Use to inject insulin 2 times daily as instructed.   100 each   1     PT NEEDS TO SCHEDULE FOLLOW UP APPT WITH DR [facility] .Marland Kitchen.   . Lancets (ONETOUCH ULTRASOFT) lancets      Use as instructed   100 each   12   . lisinopril (PRINIVIL,ZESTRIL) 10 MG tablet  Oral   Take 10 mg by mouth daily.         . meloxicam (MOBIC) 7.5 MG tablet   Oral   Take 1 tablet (7.5 mg total) by mouth daily.   30 tablet   3   . metFORMIN (GLUCOPHAGE) 500 MG tablet   Oral   Take 2 tablets (1,000 mg total) by mouth 2 (two) times daily with a meal.   120 tablet   3   . omeprazole (PRILOSEC) 40 MG capsule   Oral   Take 1 capsule (40 mg total) by mouth daily.   30 capsule   3   . Testosterone (ANDROGEL) 20.25 MG/1.25GM (1.62%) GEL   Transdermal   Place 4 Act onto the skin daily.   1.25 g   0      Allergies Review of patient's allergies indicates no known allergies.   Family History  Problem Relation Age of Onset  . Arthritis Mother   . Stroke Father   . Hypertension Father   . Kidney disease Father   . Heart attack Father   . Cancer Neg Hx   . Diabetes Neg Hx     Social History Social History  Substance Use Topics  . Smoking status: Never Smoker   . Smokeless tobacco: Never Used  . Alcohol  Use: Yes     Comment: seldom 1-2 times a month beer    Review of Systems  Constitutional:   No fever or chills. No weight changes Eyes:   No blurry vision or double vision.  ENT:   No sore throat. Cardiovascular:   No chest pain. Respiratory:   No dyspnea or cough. Gastrointestinal:   Negative for abdominal pain, vomiting and diarrhea.  No BRBPR or melena. Genitourinary:   Negative for dysuria, urinary retention, bloody urine, or difficulty urinating. Musculoskeletal:   Chronic low back pain. Skin:   Negative for rash. Neurological:   Mild diffuse headache without focal weakness or paresthesia Psychiatric:  No anxiety or depression.   Endocrine:  No hot/cold intolerance, changes in energy, or sleep difficulty.  10-point ROS otherwise negative.  ____________________________________________   PHYSICAL EXAM:  VITAL SIGNS: ED Triage Vitals  Enc Vitals Group     BP 07/11/15 0904 123/81 mmHg     Pulse Rate 07/11/15 0904 77     Resp 07/11/15 0904 18     Temp 07/11/15 0904 98 F (36.7 C)     Temp Source 07/11/15 0904 Oral     SpO2 07/11/15 0904 99 %     Weight 07/11/15 0904 172 lb (78.019 kg)     Height 07/11/15 0904 5' 6"  (1.676 m)     Head Cir --      Peak Flow --      Pain Score 07/11/15 0906 5     Pain Loc --      Pain Edu? --      Excl. in [place]? --      Constitutional:   Alert and oriented. Well appearing and in no distress. Eyes:   No scleral icterus. No conjunctival pallor. PERRL. EOMI ENT   Head:   Normocephalic and atraumatic.   Nose:   No congestion/rhinnorhea. No septal hematoma   Mouth/Throat:   MMM, no pharyngeal erythema. No peritonsillar mass. No uvula shift.   Neck:   No stridor. No SubQ emphysema. No meningismus. Hematological/Lymphatic/Immunilogical:   No cervical lymphadenopathy. Cardiovascular:   RRR. Normal and symmetric distal pulses are present in all extremities. No murmurs,  rubs, or gallops. Respiratory:   Normal respiratory effort  without tachypnea nor retractions. Breath sounds are clear and equal bilaterally. No wheezes/rales/rhonchi. Gastrointestinal:   Soft and nontender. No distention. There is no CVA tenderness.  No rebound, rigidity, or guarding. Genitourinary:   deferred Musculoskeletal:   Nontender with normal range of motion in all extremities. No joint effusions.  No lower extremity tenderness.  No edema. Neurologic:   Normal speech and language.  CN 2-10 normal. Motor grossly intact. Normal gait. No gross focal neurologic deficits are appreciated.  Skin:    Skin is warm, dry and intact. No rash noted.  No petechiae, purpura, or bullae. Psychiatric:   Mood and affect are normal. Speech and behavior are normal.  ____________________________________________    LABS (pertinent positives/negatives) (all labs ordered are listed, but only abnormal results are displayed) Labs Reviewed  GLUCOSE, CAPILLARY - Abnormal; Notable for the following:    Glucose-Capillary 507 (*)    All other components within normal limits  GLUCOSE, CAPILLARY - Abnormal; Notable for the following:    Glucose-Capillary 495 (*)    All other components within normal limits   ____________________________________________   EKG    ____________________________________________    RADIOLOGY    ____________________________________________   PROCEDURES   ____________________________________________   INITIAL IMPRESSION / ASSESSMENT AND PLAN / ED COURSE  Pertinent labs & imaging results that were available during my care of the patient were reviewed by me and considered in my medical decision making (see chart for details).  Patient presents with a new mild headache which is not concerning for cerebral edema stroke intracranial hemorrhage pseudotumor meningitis or encephalitis. He also reports chronic back pain. His presentation today seems to be related to a request for pain medication for his chronic back pain. I did  advise them that I am unable to provide opioids for this chronic pain and that he should follow up with the pain management clinic according to his referral for further evaluation and treatment of this pain.  The patient readily admits that he has not been taking his medications for the past 7 or 8 months. He states the reason for this is that when he takes his insulin he makes him feel worse. Did advise him that with his chronic severe hyperglycemia, his body will need time to adjust to euglycemia. I did clearly advise him that it is important for him to obtain good glucose control, as his chronic hyperglycemia is causing him ongoing damage to his organs and it is a matter of time until he has a heart attack, stroke, and renal failure requiring dialysis.the patient voiced understanding of this but indicated a low motivation to become compliant. He does not appear to be depressed or have an intentional wish for self-harm.  There are no concerning findings in his symptoms, and his exam is reassuring. We'll give him Reglan and Benadryl for his headache and discharge him to follow-up with his primary care plan.     ____________________________________________   FINAL CLINICAL IMPRESSION(S) / ED DIAGNOSES  Final diagnoses:  Hyperglycemia  Chronic low back pain      Carrie Mew, MD 07/11/15 [phone]

## 2016-01-01 DIAGNOSIS — Z794 Long term (current) use of insulin: Secondary | ICD-10-CM | POA: Insufficient documentation

## 2016-03-12 DIAGNOSIS — M2042 Other hammer toe(s) (acquired), left foot: Secondary | ICD-10-CM | POA: Insufficient documentation

## 2016-12-06 DIAGNOSIS — M5136 Other intervertebral disc degeneration, lumbar region: Secondary | ICD-10-CM | POA: Insufficient documentation

## 2016-12-06 DIAGNOSIS — M5126 Other intervertebral disc displacement, lumbar region: Secondary | ICD-10-CM | POA: Insufficient documentation

## 2016-12-26 DIAGNOSIS — Z9119 Patient's noncompliance with other medical treatment and regimen: Secondary | ICD-10-CM | POA: Insufficient documentation

## 2016-12-26 DIAGNOSIS — Z91199 Patient's noncompliance with other medical treatment and regimen due to unspecified reason: Secondary | ICD-10-CM | POA: Insufficient documentation

## 2017-11-26 DIAGNOSIS — M7581 Other shoulder lesions, right shoulder: Secondary | ICD-10-CM | POA: Insufficient documentation

## 2017-12-11 DIAGNOSIS — M7521 Bicipital tendinitis, right shoulder: Secondary | ICD-10-CM | POA: Insufficient documentation

## 2018-09-29 DIAGNOSIS — M7741 Metatarsalgia, right foot: Secondary | ICD-10-CM | POA: Insufficient documentation

## 2018-09-29 DIAGNOSIS — M7751 Other enthesopathy of right foot: Secondary | ICD-10-CM | POA: Insufficient documentation

## 2019-01-14 DIAGNOSIS — F1420 Cocaine dependence, uncomplicated: Secondary | ICD-10-CM | POA: Insufficient documentation

## 2019-03-30 DIAGNOSIS — E11311 Type 2 diabetes mellitus with unspecified diabetic retinopathy with macular edema: Secondary | ICD-10-CM | POA: Insufficient documentation

## 2019-03-30 DIAGNOSIS — R0683 Snoring: Secondary | ICD-10-CM | POA: Insufficient documentation

## 2019-03-30 DIAGNOSIS — F3170 Bipolar disorder, currently in remission, most recent episode unspecified: Secondary | ICD-10-CM | POA: Insufficient documentation

## 2019-03-30 DIAGNOSIS — R5383 Other fatigue: Secondary | ICD-10-CM | POA: Insufficient documentation

## 2019-03-30 DIAGNOSIS — E1165 Type 2 diabetes mellitus with hyperglycemia: Secondary | ICD-10-CM | POA: Insufficient documentation

## 2019-05-19 DIAGNOSIS — E113512 Type 2 diabetes mellitus with proliferative diabetic retinopathy with macular edema, left eye: Secondary | ICD-10-CM | POA: Insufficient documentation

## 2019-10-28 DIAGNOSIS — K5909 Other constipation: Secondary | ICD-10-CM | POA: Insufficient documentation

## 2019-10-28 DIAGNOSIS — E1143 Type 2 diabetes mellitus with diabetic autonomic (poly)neuropathy: Secondary | ICD-10-CM | POA: Insufficient documentation

## 2019-10-28 DIAGNOSIS — K5900 Constipation, unspecified: Secondary | ICD-10-CM | POA: Insufficient documentation

## 2019-12-23 DIAGNOSIS — E559 Vitamin D deficiency, unspecified: Secondary | ICD-10-CM | POA: Insufficient documentation

## 2019-12-26 DIAGNOSIS — R112 Nausea with vomiting, unspecified: Secondary | ICD-10-CM | POA: Insufficient documentation

## 2020-03-19 ENCOUNTER — Emergency Department (HOSPITAL_COMMUNITY): Payer: Medicare HMO

## 2020-03-19 ENCOUNTER — Emergency Department (HOSPITAL_COMMUNITY)
Admission: EM | Admit: 2020-03-19 | Discharge: 2020-03-19 | Disposition: A | Discharge status: Home / Self Care | Payer: Medicare HMO | Attending: Emergency Medicine | Admitting: Emergency Medicine

## 2020-03-19 ENCOUNTER — Encounter (HOSPITAL_COMMUNITY): Payer: Self-pay

## 2020-03-19 ENCOUNTER — Other Ambulatory Visit: Payer: Self-pay

## 2020-03-19 DIAGNOSIS — R112 Nausea with vomiting, unspecified: Secondary | ICD-10-CM

## 2020-03-19 DIAGNOSIS — R109 Unspecified abdominal pain: Secondary | ICD-10-CM | POA: Diagnosis present

## 2020-03-19 DIAGNOSIS — E860 Dehydration: Secondary | ICD-10-CM

## 2020-03-19 DIAGNOSIS — Z794 Long term (current) use of insulin: Secondary | ICD-10-CM | POA: Diagnosis not present

## 2020-03-19 DIAGNOSIS — I10 Essential (primary) hypertension: Secondary | ICD-10-CM | POA: Insufficient documentation

## 2020-03-19 DIAGNOSIS — Z79899 Other long term (current) drug therapy: Secondary | ICD-10-CM | POA: Insufficient documentation

## 2020-03-19 DIAGNOSIS — Y828 Other medical devices associated with adverse incidents: Secondary | ICD-10-CM | POA: Insufficient documentation

## 2020-03-19 DIAGNOSIS — E119 Type 2 diabetes mellitus without complications: Secondary | ICD-10-CM | POA: Diagnosis not present

## 2020-03-19 DIAGNOSIS — T8384XA Pain from genitourinary prosthetic devices, implants and grafts, initial encounter: Secondary | ICD-10-CM

## 2020-03-19 LAB — CBC WITH DIFFERENTIAL/PLATELET
Abs Immature Granulocytes: 0.03 10*3/uL (ref 0.00–0.07)
Basophils Absolute: 0 10*3/uL (ref 0.0–0.1)
Basophils Relative: 0 %
Eosinophils Absolute: 0.1 10*3/uL (ref 0.0–0.5)
Eosinophils Relative: 2 %
HCT: 35.8 % — ABNORMAL LOW (ref 39.0–52.0)
Hemoglobin: 12 g/dL — ABNORMAL LOW (ref 13.0–17.0)
Immature Granulocytes: 0 %
Lymphocytes Relative: 27 %
Lymphs Abs: 1.9 10*3/uL (ref 0.7–4.0)
MCH: 28.6 pg (ref 26.0–34.0)
MCHC: 33.5 g/dL (ref 30.0–36.0)
MCV: 85.2 fL (ref 80.0–100.0)
Monocytes Absolute: 0.5 10*3/uL (ref 0.1–1.0)
Monocytes Relative: 7 %
Neutro Abs: 4.6 10*3/uL (ref 1.7–7.7)
Neutrophils Relative %: 64 %
Platelets: 317 10*3/uL (ref 150–400)
RBC: 4.2 MIL/uL — ABNORMAL LOW (ref 4.22–5.81)
RDW: 13.5 % (ref 11.5–15.5)
WBC: 7.2 10*3/uL (ref 4.0–10.5)
nRBC: 0 % (ref 0.0–0.2)

## 2020-03-19 LAB — URINALYSIS, ROUTINE W REFLEX MICROSCOPIC
Bilirubin Urine: NEGATIVE
Glucose, UA: >=500 mg/dL — AB
Ketones, ur: 5 mg/dL — AB
Nitrite: POSITIVE — AB
Protein, ur: 100 mg/dL — AB
RBC / HPF: >50 RBC/hpf — ABNORMAL HIGH (ref 0–5)
Specific Gravity, Urine: 1.017 (ref 1.005–1.030)
pH: 5 (ref 5.0–8.0)

## 2020-03-19 LAB — BASIC METABOLIC PANEL
Anion gap: 9 (ref 5–15)
BUN: 16 mg/dL (ref 6–20)
CO2: 21 mmol/L — ABNORMAL LOW (ref 22–32)
Calcium: 8.4 mg/dL — ABNORMAL LOW (ref 8.9–10.3)
Chloride: 107 mmol/L (ref 98–111)
Creatinine, Ser: 1.38 mg/dL — ABNORMAL HIGH (ref 0.61–1.24)
GFR calc Af Amer: >60 mL/min (ref >60)
GFR calc non Af Amer: 57 mL/min — ABNORMAL LOW (ref >60)
Glucose, Bld: 233 mg/dL — ABNORMAL HIGH (ref 70–99)
Potassium: 3.6 mmol/L (ref 3.5–5.1)
Sodium: 137 mmol/L (ref 135–145)

## 2020-03-19 MED ORDER — PROMETHAZINE HCL 25 MG/ML IJ SOLN
12.5000 mg | Freq: Once | INTRAMUSCULAR | Status: AC
  Administered 2020-03-19: 12.5 mg via INTRAVENOUS
  Filled 2020-03-19: qty 1

## 2020-03-19 MED ORDER — FENTANYL CITRATE (PF) 100 MCG/2ML IJ SOLN
100.0000 ug | Freq: Once | INTRAMUSCULAR | Status: AC
  Administered 2020-03-19: 100 ug via INTRAVENOUS
  Filled 2020-03-19: qty 2

## 2020-03-19 MED ORDER — STERILE WATER FOR INJECTION IJ SOLN
INTRAMUSCULAR | Status: AC
  Filled 2020-03-19: qty 10

## 2020-03-19 MED ORDER — SULFAMETHOXAZOLE-TRIMETHOPRIM 800-160 MG PO TABS: 1.0000 | ORAL_TABLET | Freq: Two times a day (BID) | ORAL | 0 refills | Status: AC

## 2020-03-19 MED ORDER — SODIUM CHLORIDE 0.9 % IV BOLUS
1000.0000 mL | Freq: Once | INTRAVENOUS | Status: AC
  Administered 2020-03-19: 1000 mL via INTRAVENOUS

## 2020-03-19 MED ORDER — ONDANSETRON 8 MG PO TBDP: 8.0000 mg | ORAL_TABLET | Freq: Three times a day (TID) | ORAL | 0 refills | Status: DC | PRN

## 2020-03-19 MED ORDER — HYDROMORPHONE HCL 1 MG/ML IJ SOLN
1.0000 mg | Freq: Once | INTRAMUSCULAR | Status: AC
Start: 2020-03-19 — End: 2020-03-19
  Administered 2020-03-19: 1 mg via INTRAVENOUS
  Filled 2020-03-19: qty 1

## 2020-03-19 MED ORDER — PROMETHAZINE HCL 25 MG RE SUPP
RECTAL | 0 refills | Status: DC
Start: 2020-03-19 — End: 2020-08-23

## 2020-03-19 MED ORDER — PROMETHAZINE HCL 25 MG/ML IJ SOLN: 25.0000 mg | Freq: Once | INTRAMUSCULAR | Status: DC

## 2020-03-19 MED ORDER — SULFAMETHOXAZOLE-TRIMETHOPRIM 800-160 MG PO TABS
1.0000 | ORAL_TABLET | Freq: Once | ORAL | Status: AC
  Administered 2020-03-19: 1 via ORAL
  Filled 2020-03-19: qty 1

## 2020-03-19 MED ORDER — HYDROMORPHONE HCL 2 MG PO TABS: 2.0000 mg | ORAL_TABLET | ORAL | 0 refills | Status: DC | PRN

## 2020-03-19 NOTE — ED Notes (Signed)
Patient ate sandwich and drank gingerale, no c/o n/v

## 2020-03-19 NOTE — ED Triage Notes (Signed)
Patient had lithotripsy and right ureteral stent placed 5/3. Was to have removed but missed appointment Friday. Has had oliguria, right flank pain, n/v since 5/3.

## 2020-03-19 NOTE — ED Notes (Signed)
Patient violently retching with no output. Phenergan given. Zofran was given by EMS

## 2020-03-19 NOTE — ED Provider Notes (Addendum)
[facility] DEPT Provider Note: Georgena Spurling, MD, FACEP  CSN: [id number] MRN: [id number] ARRIVAL: 03/19/20 at [place]: [place]  Abdominal Pain   HISTORY OF PRESENT ILLNESS  03/19/20 2:01 AM James Chang is a 54 y.o. male who underwent right ureteroscopic laser lithotripsy with right ureteral stent placement on 03/13/2020 by Dr. Michaelle Birks of urology at [facility].  He is here with right lower quadrant pain and persistent nausea and vomiting since yesterday.  He rates the pain is severe, fairly sharp in nature, and worse with movement or palpation of the right lower quadrant.  He has not had a fever.  He has had decreased urine output as well as constipation for the past several days.  He was not taken to [facility] because they are on diversion.  He was given Zofran 4 mg IV by EMS prior to arrival but not given any analgesia.  He was scheduled to have the stent removed 2 days ago but was lost to follow-up.  He still has the stent protruding from his urethral meatus.   Past Medical History:  Diagnosis Date  . Depression   . Diabetes mellitus without complication ([place])   . History of migraine   . Hyperlipidemia   . Hypertension   . Personal history of drug abuse ([place])   . Stroke [facility]) 2008    Past Surgical History:  Procedure Laterality Date  . NO PAST SURGERIES      Family History  Problem Relation Age of Onset  . Arthritis Mother   . Stroke Father   . Hypertension Father   . Kidney disease Father   . Heart attack Father   . Cancer Neg Hx   . Diabetes Neg Hx     Social History   Tobacco Use  . Smoking status: Never Smoker  . Smokeless tobacco: Never Used  Substance Use Topics  . Alcohol use: Yes    Comment: seldom 1-2 times a month beer  . Drug use: Not on file    Prior to Admission medications   Medication Sig Start Date End Date Taking? Authorizing Provider  ARIPiprazole (ABILIFY) 10 MG tablet  Take 10 mg by mouth daily.    [provider]  atorvastatin (LIPITOR) 20 MG tablet Take 1 tablet (20 mg total) by mouth daily. 02/09/13   Debbrah Alar, NP  B-D ULTRAFINE III SHORT PEN 31G X 8 MM MISC AS DIRECTED 04/25/15   Philemon Kingdom, MD  Blood Glucose Monitoring Suppl (ONE TOUCH ULTRA SYSTEM KIT) W/DEVICE KIT 1 kit by Does not apply route once. 02/03/13   Philemon Kingdom, MD  gabapentin (NEURONTIN) 300 MG capsule Take 1 capsule (300 mg total) by mouth 3 (three) times daily. 03/25/13   Philemon Kingdom, MD  glucose blood (ONE TOUCH ULTRA TEST) test strip Use as instructed 02/03/13   Philemon Kingdom, MD  HYDROmorphone (DILAUDID) 2 MG tablet Take 1 tablet (2 mg total) by mouth every 4 (four) hours as needed for severe pain. 03/19/20   Luverna Degenhart, MD  insulin aspart protamine- aspart (NOVOLOG 70/30) (70-30) 100 UNIT/ML injection Inject under skin 50 units 15 min before breakfast and 45 units 15 min before dinner 03/25/13   Philemon Kingdom, MD  Insulin Syringe-Needle U-100 (BD INSULIN SYRINGE ULTRAFINE) 31G X 5/16" 1 ML MISC Use to inject insulin 2 times daily as instructed. 12/13/13   Philemon Kingdom, MD  Lancets [facility] ULTRASOFT) lancets Use as instructed 11/02/12  Philemon Kingdom, MD  lisinopril (PRINIVIL,ZESTRIL) 10 MG tablet Take 10 mg by mouth daily.    [provider]  meloxicam (MOBIC) 7.5 MG tablet Take 1 tablet (7.5 mg total) by mouth daily. 02/18/13   Philemon Kingdom, MD  metFORMIN (GLUCOPHAGE) 500 MG tablet Take 2 tablets (1,000 mg total) by mouth 2 (two) times daily with a meal. 03/25/13   Philemon Kingdom, MD  omeprazole (PRILOSEC) 40 MG capsule Take 1 capsule (40 mg total) by mouth daily. 02/01/13   Debbrah Alar, NP  ondansetron (ZOFRAN ODT) 8 MG disintegrating tablet Take 1 tablet (8 mg total) by mouth every 8 (eight) hours as needed for nausea or vomiting. 03/19/20   Keslee Harrington, MD  promethazine (PHENERGAN) 25 MG suppository Place 1 suppository  rectally every 6 hours as needed for nausea and vomiting not controlled by oral Zofran. 03/19/20   Carel Schnee, MD  Testosterone (ANDROGEL) 20.25 MG/1.25GM (1.62%) GEL Place 4 Act onto the skin daily. 12/29/12   Debbrah Alar, NP    Allergies Metformin and related and Reglan [metoclopramide]   REVIEW OF SYSTEMS  Negative except as noted here or in the History of Present Illness.   PHYSICAL EXAMINATION  Initial Vital Signs Blood pressure (!) 165/102, pulse 94, temperature 98.1 F (36.7 C), temperature source Oral, height 5' 6"  (1.676 m), weight 78.9 kg, SpO2 100 %.  Examination General: Well-developed, well-nourished male in no acute distress; appearance consistent with age of record HENT: normocephalic; atraumatic Eyes: pupils equal, round and reactive to light; extraocular muscles intact Neck: supple Heart: regular rate and rhythm Lungs: clear to auscultation bilaterally Abdomen: soft; nondistended; right lower quadrant tenderness; bowel sounds present GU: No CVA tenderness Extremities: No deformity; full range of motion; pulses normal Neurologic: Awake, alert and oriented; motor function intact in all extremities and symmetric; no facial droop Skin: Warm and dry Psychiatric: Flat affect   RESULTS  Summary of this visit's results, reviewed and interpreted by myself:   EKG Interpretation  Date/Time:    Ventricular Rate:    PR Interval:    QRS Duration:   QT Interval:    QTC Calculation:   R Axis:     Text Interpretation:        Laboratory Studies: Results for orders placed or performed during the hospital encounter of 03/19/20 (from the past 24 hour(s))  Urinalysis, Routine w reflex microscopic     Status: Abnormal   Collection Time: 03/19/20  2:00 AM  Result Value Ref Range   Color, Urine AMBER (A) YELLOW   APPearance HAZY (A) CLEAR   Specific Gravity, Urine 1.017 1.005 - 1.030   pH 5.0 5.0 - 8.0   Glucose, UA >=500 (A) NEGATIVE mg/dL   Hgb urine  dipstick LARGE (A) NEGATIVE   Bilirubin Urine NEGATIVE NEGATIVE   Ketones, ur 5 (A) NEGATIVE mg/dL   Protein, ur 100 (A) NEGATIVE mg/dL   Nitrite POSITIVE (A) NEGATIVE   Leukocytes,Ua MODERATE (A) NEGATIVE   RBC / HPF >50 (H) 0 - 5 RBC/hpf   WBC, UA 21-50 0 - 5 WBC/hpf   Bacteria, UA RARE (A) NONE SEEN   Mucus PRESENT    Hyaline Casts, UA PRESENT   CBC with Differential/Platelet     Status: Abnormal   Collection Time: 03/19/20  2:01 AM  Result Value Ref Range   WBC 7.2 4.0 - 10.5 K/uL   RBC 4.20 (L) 4.22 - 5.81 MIL/uL   Hemoglobin 12.0 (L) 13.0 - 17.0 g/dL   HCT 35.8 (  L) 39.0 - 52.0 %   MCV 85.2 80.0 - 100.0 fL   MCH 28.6 26.0 - 34.0 pg   MCHC 33.5 30.0 - 36.0 g/dL   RDW 13.5 11.5 - 15.5 %   Platelets 317 150 - 400 K/uL   nRBC 0.0 0.0 - 0.2 %   Neutrophils Relative % 64 %   Neutro Abs 4.6 1.7 - 7.7 K/uL   Lymphocytes Relative 27 %   Lymphs Abs 1.9 0.7 - 4.0 K/uL   Monocytes Relative 7 %   Monocytes Absolute 0.5 0.1 - 1.0 K/uL   Eosinophils Relative 2 %   Eosinophils Absolute 0.1 0.0 - 0.5 K/uL   Basophils Relative 0 %   Basophils Absolute 0.0 0.0 - 0.1 K/uL   Immature Granulocytes 0 %   Abs Immature Granulocytes 0.03 0.00 - 0.07 K/uL  Basic metabolic panel     Status: Abnormal   Collection Time: 03/19/20  2:01 AM  Result Value Ref Range   Sodium 137 135 - 145 mmol/L   Potassium 3.6 3.5 - 5.1 mmol/L   Chloride 107 98 - 111 mmol/L   CO2 21 (L) 22 - 32 mmol/L   Glucose, Bld 233 (H) 70 - 99 mg/dL   BUN 16 6 - 20 mg/dL   Creatinine, Ser 1.38 (H) 0.61 - 1.24 mg/dL   Calcium 8.4 (L) 8.9 - 10.3 mg/dL   GFR calc non Af Amer 57 (L) >60 mL/min   GFR calc Af Amer >60 >60 mL/min   Anion gap 9 5 - 15   Imaging Studies: CT Renal Stone Study  Result Date: 03/19/2020 CLINICAL DATA:  Lithotripsy and right ureteral stent placement 03/13/2020, right flank pain, oliguria EXAM: CT ABDOMEN AND PELVIS WITHOUT CONTRAST TECHNIQUE: Multidetector CT imaging of the abdomen and pelvis was  performed following the standard protocol without IV contrast. COMPARISON:  03/12/2020 FINDINGS: Lower chest: No acute pleural or parenchymal lung disease. Hepatobiliary: Gallbladder is moderately distended with gallbladder sludge. No evidence of cholelithiasis or cholecystitis. The liver is unremarkable. Pancreas: Unremarkable. No pancreatic ductal dilatation or surrounding inflammatory changes. Spleen: Normal in size without focal abnormality. Adrenals/Urinary Tract: The right ureteral stent extends from the right renal pelvis to the bladder lumen. No right-sided calculi or obstruction. Stable punctate 2 mm calculus upper pole left kidney. No left-sided obstructive uropathy. The bladder is grossly normal. The adrenals are unremarkable. Stomach/Bowel: No bowel obstruction or ileus. Normal appendix right lower quadrant. No bowel wall thickening or inflammatory changes. Vascular/Lymphatic: No significant vascular findings are present. No enlarged abdominal or pelvic lymph nodes. Reproductive: Prostate is unremarkable. Other: No abdominal wall hernia or abnormality. No abdominopelvic ascites. Musculoskeletal: No acute or destructive bony lesions. Reconstructed images demonstrate no additional findings. IMPRESSION: 1. Right ureteral stent in place. No right-sided calculi or obstruction. 2. Stable punctate 2 mm calculus upper pole left kidney. 3. Gallbladder sludge. No evidence of cholelithiasis or cholecystitis. Electronically Signed   By: Randa Ngo M.D.   On: 03/19/2020 03:20    ED COURSE and MDM  Nursing notes, initial and subsequent vitals signs, including pulse oximetry, reviewed and interpreted by myself.  Vitals:   03/19/20 0545 03/19/20 0600 03/19/20 0615 03/19/20 0630  BP:  (!) 155/98    Pulse: 90 98 88 (!) 109  Resp:      Temp:      TempSrc:      SpO2: 100% 100% 97% 98%  Weight:      Height:  Medications  fentaNYL (SUBLIMAZE) injection 100 mcg (has no administration in time range)    HYDROmorphone (DILAUDID) injection 1 mg (1 mg Intravenous Given 03/19/20 0201)  promethazine (PHENERGAN) injection 12.5 mg (12.5 mg Intravenous Given 03/19/20 0226)  sterile water (preservative free) injection (  Given 03/19/20 0229)  sodium chloride 0.9 % bolus 1,000 mL (0 mLs Intravenous Stopped 03/19/20 0556)  sodium chloride 0.9 % bolus 1,000 mL (0 mLs Intravenous Stopped 03/19/20 0556)  sodium chloride 0.9 % bolus 1,000 mL (1,000 mLs Intravenous New Bag/Given 03/19/20 0556)   6:14 AM Patient transiently hypotensive after IV Dilaudid.  I suspect this represents the effects of the IV narcotics in the setting of dehydration.  He has been given 3 L of normal saline with improvement in his blood pressure.  He is showing no signs of sepsis (no leukocytosis, no fever, no tachypnea, no tachycardia).  His CT is showing no evidence of urinary obstruction or bowel obstruction.   6:31 AM Patient has been able to eat a sandwich and drink fluids without vomiting.  He states part of his problem has been that he has had no narcotic or antiemetic prescriptions at home and this was confirmed with [place].  We will provide prescriptions and have him follow-up with Dr. Felipa Eth tomorrow to arrange for stent removal.  Urine sent for culture.  Per discharge summary patient had been placed on Bactrim DS at discharge, last dose today.  We will restart him pending follow-up.   PROCEDURES  Procedures   ED DIAGNOSES     ICD-10-CM   1. Pain due to ureteral stent, initial encounter ([place])  T83.84XA   2. Nausea and vomiting in adult  R11.2   3. Dehydration  E86.0        Amulya Quintin, Jenny Reichmann, MD 03/19/20 0636    Shanon Rosser, MD 03/19/20 [phone]

## 2020-03-23 ENCOUNTER — Encounter (HOSPITAL_BASED_OUTPATIENT_CLINIC_OR_DEPARTMENT_OTHER): Payer: Self-pay

## 2020-03-23 ENCOUNTER — Other Ambulatory Visit: Payer: Self-pay

## 2020-03-23 ENCOUNTER — Emergency Department (HOSPITAL_BASED_OUTPATIENT_CLINIC_OR_DEPARTMENT_OTHER)
Admission: EM | Admit: 2020-03-23 | Discharge: 2020-03-23 | Disposition: A | Discharge status: Home / Self Care | Payer: Medicare HMO | Attending: Emergency Medicine | Admitting: Emergency Medicine

## 2020-03-23 DIAGNOSIS — E119 Type 2 diabetes mellitus without complications: Secondary | ICD-10-CM | POA: Diagnosis not present

## 2020-03-23 DIAGNOSIS — Z794 Long term (current) use of insulin: Secondary | ICD-10-CM | POA: Insufficient documentation

## 2020-03-23 DIAGNOSIS — R112 Nausea with vomiting, unspecified: Secondary | ICD-10-CM | POA: Insufficient documentation

## 2020-03-23 DIAGNOSIS — I10 Essential (primary) hypertension: Secondary | ICD-10-CM | POA: Diagnosis not present

## 2020-03-23 DIAGNOSIS — Z79899 Other long term (current) drug therapy: Secondary | ICD-10-CM | POA: Diagnosis not present

## 2020-03-23 DIAGNOSIS — R1013 Epigastric pain: Secondary | ICD-10-CM

## 2020-03-23 LAB — CBC WITH DIFFERENTIAL/PLATELET
Abs Immature Granulocytes: 0.03 10*3/uL (ref 0.00–0.07)
Basophils Absolute: 0 10*3/uL (ref 0.0–0.1)
Basophils Relative: 0 %
Eosinophils Absolute: 0.1 10*3/uL (ref 0.0–0.5)
Eosinophils Relative: 1 %
HCT: 39.6 % (ref 39.0–52.0)
Hemoglobin: 13.9 g/dL (ref 13.0–17.0)
Immature Granulocytes: 0 %
Lymphocytes Relative: 18 %
Lymphs Abs: 1.2 10*3/uL (ref 0.7–4.0)
MCH: 28.4 pg (ref 26.0–34.0)
MCHC: 35.1 g/dL (ref 30.0–36.0)
MCV: 80.8 fL (ref 80.0–100.0)
Monocytes Absolute: 0.4 10*3/uL (ref 0.1–1.0)
Monocytes Relative: 6 %
Neutro Abs: 5 10*3/uL (ref 1.7–7.7)
Neutrophils Relative %: 75 %
Platelets: 404 10*3/uL — ABNORMAL HIGH (ref 150–400)
RBC: 4.9 MIL/uL (ref 4.22–5.81)
RDW: 13.5 % (ref 11.5–15.5)
WBC: 6.7 10*3/uL (ref 4.0–10.5)
nRBC: 0 % (ref 0.0–0.2)

## 2020-03-23 LAB — COMPREHENSIVE METABOLIC PANEL
ALT: 14 U/L (ref 0–44)
AST: 16 U/L (ref 15–41)
Albumin: 4.4 g/dL (ref 3.5–5.0)
Alkaline Phosphatase: 57 U/L (ref 38–126)
Anion gap: 15 (ref 5–15)
BUN: 21 mg/dL — ABNORMAL HIGH (ref 6–20)
CO2: 21 mmol/L — ABNORMAL LOW (ref 22–32)
Calcium: 9.7 mg/dL (ref 8.9–10.3)
Chloride: 93 mmol/L — ABNORMAL LOW (ref 98–111)
Creatinine, Ser: 1.38 mg/dL — ABNORMAL HIGH (ref 0.61–1.24)
GFR calc Af Amer: >60 mL/min (ref >60)
GFR calc non Af Amer: 57 mL/min — ABNORMAL LOW (ref >60)
Glucose, Bld: 322 mg/dL — ABNORMAL HIGH (ref 70–99)
Potassium: 4.3 mmol/L (ref 3.5–5.1)
Sodium: 129 mmol/L — ABNORMAL LOW (ref 135–145)
Total Bilirubin: 1.2 mg/dL (ref 0.3–1.2)
Total Protein: 8 g/dL (ref 6.5–8.1)

## 2020-03-23 LAB — POCT I-STAT EG7
Acid-Base Excess: 2 mmol/L (ref 0.0–2.0)
Bicarbonate: 23.6 mmol/L (ref 20.0–28.0)
Calcium, Ion: 1.18 mmol/L (ref 1.15–1.40)
HCT: 41 % (ref 39.0–52.0)
Hemoglobin: 13.9 g/dL (ref 13.0–17.0)
O2 Saturation: 41 %
Patient temperature: 97.7
Potassium: 4.6 mmol/L (ref 3.5–5.1)
Sodium: 132 mmol/L — ABNORMAL LOW (ref 135–145)
TCO2: 24 mmol/L (ref 22–32)
pCO2, Ven: 26.7 mmHg — ABNORMAL LOW (ref 44.0–60.0)
pH, Ven: 7.552 — ABNORMAL HIGH (ref 7.250–7.430)
pO2, Ven: 19 mmHg — CL (ref 32.0–45.0)

## 2020-03-23 LAB — LIPASE, BLOOD: Lipase: 21 U/L (ref 11–51)

## 2020-03-23 MED ORDER — GABAPENTIN 300 MG PO CAPS
ORAL_CAPSULE | ORAL | Status: AC
  Filled 2020-03-23: qty 1

## 2020-03-23 MED ORDER — GABAPENTIN 300 MG PO CAPS
300.0000 mg | ORAL_CAPSULE | Freq: Once | ORAL | Status: AC
  Administered 2020-03-23: 300 mg via ORAL

## 2020-03-23 MED ORDER — PANTOPRAZOLE SODIUM 40 MG IV SOLR
40.0000 mg | Freq: Once | INTRAVENOUS | Status: AC
  Administered 2020-03-23: 40 mg via INTRAVENOUS
  Filled 2020-03-23: qty 40

## 2020-03-23 MED ORDER — LACTATED RINGERS IV BOLUS
1000.0000 mL | Freq: Once | INTRAVENOUS | Status: AC
  Administered 2020-03-23: 1000 mL via INTRAVENOUS

## 2020-03-23 MED ORDER — GABAPENTIN 600 MG PO TABS
300.0000 mg | ORAL_TABLET | Freq: Once | ORAL | Status: DC
  Filled 2020-03-23: qty 0.5

## 2020-03-23 MED ORDER — LISINOPRIL 10 MG PO TABS
40.0000 mg | ORAL_TABLET | Freq: Once | ORAL | Status: AC
  Administered 2020-03-23: 40 mg via ORAL
  Filled 2020-03-23: qty 4

## 2020-03-23 MED ORDER — METOPROLOL TARTRATE 50 MG PO TABS
25.0000 mg | ORAL_TABLET | Freq: Once | ORAL | Status: AC
  Administered 2020-03-23: 25 mg via ORAL
  Filled 2020-03-23: qty 1

## 2020-03-23 MED ORDER — DROPERIDOL 2.5 MG/ML IJ SOLN
5.0000 mg | Freq: Once | INTRAMUSCULAR | Status: AC
  Administered 2020-03-23: 5 mg via INTRAVENOUS
  Filled 2020-03-23: qty 2

## 2020-03-23 MED ORDER — LORAZEPAM 2 MG/ML IJ SOLN
1.0000 mg | Freq: Once | INTRAMUSCULAR | Status: AC
  Administered 2020-03-23: 1 mg via INTRAVENOUS
  Filled 2020-03-23: qty 1

## 2020-03-23 NOTE — ED Provider Notes (Signed)
[place] EMERGENCY DEPARTMENT Provider Note   CSN: [id number] Arrival date & time: 03/23/20  1255     History Chief Complaint  Patient presents with  . Emesis    James Chang is a 54 y.o. male.  Patient is a 53 year old male with a history of diabetes, hypertension, hyperlipidemia, depression and polysubstance abuse who recently had a double-J right ureteral stent for stone that was removed yesterday without complication who continues to have nausea vomiting and epigastric pain.  Patient reports for the last 2 weeks he has had persistent nausea and vomiting.  Patient was seen 4 days ago for similar in our system and and also seen in the emergency department at [facility] 3 days ago for similar symptoms.  Looking back through the chart patient has been seen in the ER, admitted to the hospital and followed with gastroenterology for very similar symptoms.  He had an EGD on 12/2019 which showed gastritis but normal stomach and duodenum.  It was recommended that he continue twice daily PPI.  They did request for him to get a colonoscopy but he had a CT scan done 3 days ago that showed no acute intestinal pathology.  It did show a stent at that time which has since been removed and gallbladder sludge.  Patient complains of diffuse abdominal pain mostly in the upper abdomen.  He reports he is not paying enough but does not have symptoms of urinary retention.  He is still using his diabetic medications.  He denies alcohol use but does admit to using cocaine.  The history is provided by the patient.  Emesis Severity:  Severe Duration:  2 weeks Timing:  Constant Number of daily episodes:  >10 Quality:  Stomach contents and undigested food (dry heaving) Progression:  Unchanged Chronicity:  Recurrent Recent urination:  Decreased Relieved by:  Nothing Worsened by:  Nothing Ineffective treatments:  Antiemetics Associated symptoms: abdominal pain and myalgias   Associated  symptoms: no cough, no diarrhea, no fever, no headaches and no URI   Associated symptoms comment:  Epigastric pain, no SOB, not eating for over a week due to vomiting.  Not having bowel movements and decreased urination. Risk factors: alcohol use   Risk factors: no prior abdominal surgery, no sick contacts and no travel to endemic areas   Risk factors comment:  Rare alcohol use and does use cocaine but denies marijuana      Past Medical History:  Diagnosis Date  . Depression   . Diabetes mellitus without complication ([place])   . History of migraine   . Hyperlipidemia   . Hypertension   . Personal history of drug abuse ([place])   . Stroke [facility]) 2008    Patient Active Problem List   Diagnosis Date Noted  . Peripheral neuropathy 11/24/2012  . Type 2 diabetes, uncontrolled, with retinopathy ([place]) 09/30/2012  . Hyperlipidemia 09/30/2012  . Chronic low back pain 09/30/2012  . Low testosterone 09/30/2012  . Depression 09/30/2012  . History of stroke 09/30/2012  . HTN (hypertension) 09/30/2012    Past Surgical History:  Procedure Laterality Date  . NO PAST SURGERIES         Family History  Problem Relation Age of Onset  . Arthritis Mother   . Stroke Father   . Hypertension Father   . Kidney disease Father   . Heart attack Father   . Cancer Neg Hx   . Diabetes Neg Hx     Social History   Tobacco  Use  . Smoking status: Never Smoker  . Smokeless tobacco: Never Used  Substance Use Topics  . Alcohol use: Yes    Comment: seldom 1-2 times a month beer  . Drug use: Yes    Types: Cocaine    Comment: "a couple days ago"    Home Medications Prior to Admission medications   Medication Sig Start Date End Date Taking? Authorizing Provider  ARIPiprazole (ABILIFY) 10 MG tablet Take 10 mg by mouth daily.    [provider]  atorvastatin (LIPITOR) 20 MG tablet Take 1 tablet (20 mg total) by mouth daily. 02/09/13   Debbrah Alar, NP  B-D ULTRAFINE III SHORT PEN 31G X  8 MM MISC AS DIRECTED 04/25/15   Philemon Kingdom, MD  Blood Glucose Monitoring Suppl (ONE TOUCH ULTRA SYSTEM KIT) W/DEVICE KIT 1 kit by Does not apply route once. 02/03/13   Philemon Kingdom, MD  gabapentin (NEURONTIN) 300 MG capsule Take 1 capsule (300 mg total) by mouth 3 (three) times daily. 03/25/13   Philemon Kingdom, MD  glucose blood (ONE TOUCH ULTRA TEST) test strip Use as instructed 02/03/13   Philemon Kingdom, MD  HYDROmorphone (DILAUDID) 2 MG tablet Take 1 tablet (2 mg total) by mouth every 4 (four) hours as needed for severe pain. 03/19/20   Molpus, John, MD  insulin aspart protamine- aspart (NOVOLOG 70/30) (70-30) 100 UNIT/ML injection Inject under skin 50 units 15 min before breakfast and 45 units 15 min before dinner 03/25/13   Philemon Kingdom, MD  Insulin Syringe-Needle U-100 (BD INSULIN SYRINGE ULTRAFINE) 31G X 5/16" 1 ML MISC Use to inject insulin 2 times daily as instructed. 12/13/13   Philemon Kingdom, MD  Lancets Glory Rosebush ULTRASOFT) lancets Use as instructed 11/02/12   Philemon Kingdom, MD  lisinopril (PRINIVIL,ZESTRIL) 10 MG tablet Take 10 mg by mouth daily.    [provider]  meloxicam (MOBIC) 7.5 MG tablet Take 1 tablet (7.5 mg total) by mouth daily. 02/18/13   Philemon Kingdom, MD  metFORMIN (GLUCOPHAGE) 500 MG tablet Take 2 tablets (1,000 mg total) by mouth 2 (two) times daily with a meal. 03/25/13   Philemon Kingdom, MD  omeprazole (PRILOSEC) 40 MG capsule Take 1 capsule (40 mg total) by mouth daily. 02/01/13   Debbrah Alar, NP  ondansetron (ZOFRAN ODT) 8 MG disintegrating tablet Take 1 tablet (8 mg total) by mouth every 8 (eight) hours as needed for nausea or vomiting. 03/19/20   Molpus, John, MD  promethazine (PHENERGAN) 25 MG suppository Place 1 suppository rectally every 6 hours as needed for nausea and vomiting not controlled by oral Zofran. 03/19/20   Molpus, John, MD  sulfamethoxazole-trimethoprim (BACTRIM DS) 800-160 MG tablet Take 1 tablet by mouth 2  (two) times daily for 5 days. 03/19/20 03/24/20  Molpus, John, MD  Testosterone (ANDROGEL) 20.25 MG/1.25GM (1.62%) GEL Place 4 Act onto the skin daily. 12/29/12   Debbrah Alar, NP    Allergies    Metformin and related and Reglan [metoclopramide]  Review of Systems   Review of Systems  Constitutional: Negative for fever.  Respiratory: Negative for cough.   Gastrointestinal: Positive for abdominal pain and vomiting. Negative for diarrhea.  Musculoskeletal: Positive for myalgias.  Neurological: Negative for headaches.  All other systems reviewed and are negative.   Physical Exam Updated Vital Signs BP (!) 191/111 (BP Location: Right Arm) Comment: pt states he has not been taking his BP Medication r/t NV  Pulse 95   Temp 97.7 F (36.5 C) (Oral)   Resp  20   Ht 5' 6"  (1.676 m)   Wt 78.9 kg   SpO2 100%   BMI 28.08 kg/m   Physical Exam Vitals and nursing note reviewed.  Constitutional:      Appearance: Normal appearance. He is well-developed and normal weight.     Comments: Retching and dry heaving on exam.  Grabbing his abdomen rocking back and forth.  HENT:     Head: Normocephalic and atraumatic.     Mouth/Throat:     Mouth: Mucous membranes are moist.  Eyes:     Conjunctiva/sclera: Conjunctivae normal.     Pupils: Pupils are equal, round, and reactive to light.  Cardiovascular:     Rate and Rhythm: Regular rhythm. Tachycardia present.     Heart sounds: No murmur.  Pulmonary:     Effort: Pulmonary effort is normal. No respiratory distress.     Breath sounds: Normal breath sounds. No wheezing or rales.  Abdominal:     General: There is no distension.     Palpations: Abdomen is soft.     Tenderness: There is abdominal tenderness in the epigastric area and left upper quadrant. There is no right CVA tenderness, left CVA tenderness, guarding or rebound. Negative signs include Murphy's sign.  Musculoskeletal:        General: No tenderness. Normal range of motion.      Cervical back: Normal range of motion and neck supple.     Right lower leg: No edema.     Left lower leg: No edema.  Skin:    General: Skin is warm and dry.     Capillary Refill: Capillary refill takes less than 2 seconds.     Findings: No erythema or rash.  Neurological:     Mental Status: He is alert and oriented to person, place, and time. Mental status is at baseline.  Psychiatric:     Comments: Cooperative but intermittently asking for Dilaudid     ED Results / Procedures / Treatments   Labs (all labs ordered are listed, but only abnormal results are displayed) Labs Reviewed  CBC WITH DIFFERENTIAL/PLATELET - Abnormal; Notable for the following components:      Result Value   Platelets 404 (*)    All other components within normal limits  COMPREHENSIVE METABOLIC PANEL - Abnormal; Notable for the following components:   Sodium 129 (*)    Chloride 93 (*)    CO2 21 (*)    Glucose, Bld 322 (*)    BUN 21 (*)    Creatinine, Ser 1.38 (*)    GFR calc non Af Amer 57 (*)    All other components within normal limits  POCT I-STAT EG7 - Abnormal; Notable for the following components:   pH, Ven 7.552 (*)    pCO2, Ven 26.7 (*)    pO2, Ven 19.0 (*)    Sodium 132 (*)    All other components within normal limits  LIPASE, BLOOD  I-STAT VENOUS BLOOD GAS, ED    EKG EKG Interpretation  Date/Time:  Thursday Mar 23 2020 13:02:32 EDT Ventricular Rate:  99 PR Interval:    QRS Duration: 88 QT Interval:  349 QTC Calculation: 448 R Axis:   82 Text Interpretation: Sinus rhythm Nonspecific T abnormalities, diffuse leads No previous tracing Confirmed by Blanchie Dessert [id number]) on 03/23/2020 1:13:05 PM   Radiology No results found.  Procedures Procedures (including critical care time)  Medications Ordered in ED Medications  droperidol (INAPSINE) 2.5 MG/ML injection 5 mg (5 mg Intravenous  Given 03/23/20 1323)  lactated ringers bolus 1,000 mL (1,000 mLs Intravenous New Bag/Given  03/23/20 1326)    ED Course  I have reviewed the triage vital signs and the nursing notes.  Pertinent labs & imaging results that were available during my care of the patient were reviewed by me and considered in my medical decision making (see chart for details).    MDM Rules/Calculators/A&P                      53 year old male presenting with upper abdominal pain persistent nausea and vomiting.  Patient is diabetic and has recently had removal of a double-J stent by urology yesterday.  Patient denies any fever or diarrhea.  He does report that he is not eating and has not had a bowel movement in 2 weeks.  Patient has had multiple emergency room visits and hospitalizations in the past for similar symptoms.  He had an EGD done in February that showed gastritis but no evidence of ulcer.  He has no known history of liver disease or cirrhosis.  He denies any bloody emesis.  He does not take anticoagulation or NSAIDs.  He is supposed to be taking a PPI twice daily but since the vomiting has not been taking meds regularly.  Suspect patient's symptoms are more related to his ongoing chronic issues.  Low suspicion that this is related to his acute kidney issues.  Lower suspicion for DKA at this time.  Concern for possible gastroparesis as well as gastritis as he reports pain radiates from his stomach up through his chest with vomiting.  Low suspicion for Boerhaave's tear at this time.  Patient is satting 100% on room air.  Heart rate is between 90s to low 100s.  He is hypertensive here but afebrile.  VBG shows alkalosis with normal bicarb and mildly decreased CO2.  CBC, CMP and lipase are pending.  Patient given droperidol after EKG showed no sign of QT prolongation.  Also given Protonix IV.  After droperidol patient has not had any vomiting at this time but is still requesting Dilaudid.  2:13 PM Pt remains hypertensive with pressure of 194/107 and was given ppx meds of lisinopril 103m and metoprolol 223m which are his home meds.  Will also give gabapentin.  Labs are unchanged from 3 days ago.  Will attempt to po challenge.  Final Clinical Impression(s) / ED Diagnoses Final diagnoses:  None    Rx / DC Orders ED Discharge Orders    None       PlBlanchie DessertMD 03/24/20 [phone]

## 2020-03-23 NOTE — ED Provider Notes (Signed)
  Physical Exam  BP (!) 179/100   Pulse 97   Temp 97.7 F (36.5 C) (Oral)   Resp 16   Ht 5\' 6"  (1.676 m)   Wt 78.9 kg   SpO2 98%   BMI 28.08 kg/m   Physical Exam  ED Course/Procedures     Procedures  MDM  Received care of pt from Dr. . Please see her note for prior history, physical and care.  No continuing emesis in the ED, was able to tolerate gingerale and home BP medications.  Has rx for nausea medications.  Recommend close follow up with gastroenterology and PCP.        Anitra Lauth, MD 03/24/20 2204

## 2020-03-23 NOTE — ED Triage Notes (Signed)
Pt arrives via [facility] from home with reports of vomiting X3 weeks states that he has been seen at [facility], unable to find answer to why he is vomiting.   VS with EMS HR 84, CBG 291, BP 160/90  Pt was given 300 NS IV by EMS and 4mg  Zofran IV. Pt has dry heaves in triage.

## 2020-03-23 NOTE — ED Notes (Signed)
Pt states  I thought that med was to put me to sleep , I told him it would  He states what about dilaudid? Informed dr Anitra Lauth

## 2020-03-23 NOTE — ED Notes (Signed)
Pt took meds with gingerale  States gababpentin is not worth  anything

## 2020-07-19 DIAGNOSIS — F19959 Other psychoactive substance use, unspecified with psychoactive substance-induced psychotic disorder, unspecified: Secondary | ICD-10-CM | POA: Insufficient documentation

## 2020-07-19 DIAGNOSIS — F1994 Other psychoactive substance use, unspecified with psychoactive substance-induced mood disorder: Secondary | ICD-10-CM | POA: Insufficient documentation

## 2020-08-23 ENCOUNTER — Ambulatory Visit (INDEPENDENT_AMBULATORY_CARE_PROVIDER_SITE_OTHER): Payer: 59 | Admitting: Sports Medicine

## 2020-08-23 ENCOUNTER — Encounter: Payer: Self-pay | Admitting: Sports Medicine

## 2020-08-23 ENCOUNTER — Other Ambulatory Visit: Payer: Self-pay

## 2020-08-23 DIAGNOSIS — M205X9 Other deformities of toe(s) (acquired), unspecified foot: Secondary | ICD-10-CM

## 2020-08-23 DIAGNOSIS — E119 Type 2 diabetes mellitus without complications: Secondary | ICD-10-CM

## 2020-08-23 DIAGNOSIS — M2141 Flat foot [pes planus] (acquired), right foot: Secondary | ICD-10-CM

## 2020-08-23 DIAGNOSIS — M2142 Flat foot [pes planus] (acquired), left foot: Secondary | ICD-10-CM

## 2020-08-23 NOTE — Progress Notes (Signed)
Subjective: James Chang is a 54 y.o. male patient with history of diabetes who presents to office today for diabetic foot exam reports that he has cold sensation to the burning tingling and throbbing has a history of neuropathy and reports that his sugars are out of whack last A1c was 13 previous A1c was 15.  Patient reports that he self-referred himself here because he wants to get diabetic shoes and was previously living in [place] and had a foot doctor there and got shoes often from [employer].  Patient denies any other pedal complaints at this time.  Review of system noncontributory  Patient Active Problem List   Diagnosis Date Noted  . 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 Substance induced mood disorder (HCC) 07/19/2020  . Intractable vomiting with nausea 12/26/2019  . Vitamin D deficiency 12/23/2019  . Diabetic gastroparesis (HCC) 10/28/2019  . Other constipation 10/28/2019  . Proliferative diabetic retinopathy of left eye with macular edema associated with type 2 diabetes mellitus (HCC) 05/19/2019  . Bipolar affective disorder in remission (HCC) 03/30/2019  . Diabetic retinopathy of right eye with macular edema associated with type 2 diabetes mellitus (HCC) 03/30/2019  . Fatigue 03/30/2019  . Snoring 03/30/2019  . Uncontrolled type 2 diabetes mellitus with hyperglycemia (HCC) 03/30/2019  . Cocaine use disorder, severe, dependence (HCC) 01/14/2019  . Bursitis of right foot 09/29/2018  . Metatarsalgia of right foot 09/29/2018  . Biceps tendonitis on right 12/11/2017  . Tendinitis of right rotator cuff 11/26/2017  . Noncompliance with diabetes treatment 12/26/2016  . Bulging of lumbar intervertebral disc 12/06/2016  . Hammer toe of left foot 03/12/2016  . Current use of insulin (HCC) 01/01/2016  . Osteoarthritis of spine with radiculopathy, lumbar region 11/15/2014  . Chronic pain syndrome 08/18/2014  . Diabetes, polyneuropathy (HCC) 08/18/2014  .  Esophageal reflux 12/16/2013  . Schizoaffective disorder (HCC) 04/17/2013  . Peripheral neuropathy 11/24/2012  . Type 2 diabetes, uncontrolled, with retinopathy (HCC) 09/30/2012  . Hyperlipidemia 09/30/2012  . Chronic low back pain 09/30/2012  . Low testosterone 09/30/2012  . Depression 09/30/2012  . History of stroke 09/30/2012  . HTN (hypertension) 09/30/2012   Current Outpatient Medications on File Prior to Visit  Medication Sig Dispense Refill  . insulin glargine (LANTUS SOLOSTAR) 100 UNIT/ML Solostar Pen Inject into the skin.    . naproxen (NAPROSYN) 500 MG tablet Take by mouth.    . pantoprazole (PROTONIX) 40 MG tablet Take by mouth.    . polyethylene glycol powder (GLYCOLAX/MIRALAX) 17 GM/SCOOP powder Take by mouth.    . pregabalin (LYRICA) 75 MG capsule Take by mouth.    . rosuvastatin (CRESTOR) 5 MG tablet Take by mouth.    . tadalafil (CIALIS) 10 MG tablet Take by mouth.    Marland Kitchen tiZANidine (ZANAFLEX) 2 MG tablet Take by mouth.    . gabapentin (NEURONTIN) 300 MG capsule Take 1 capsule (300 mg total) by mouth 3 (three) times daily. 180 capsule 3  . hydrOXYzine (ATARAX/VISTARIL) 10 MG tablet Take by mouth.    Marland Kitchen lisinopril (PRINIVIL,ZESTRIL) 10 MG tablet Take 10 mg by mouth daily.    Marland Kitchen omeprazole (PRILOSEC) 40 MG capsule Take 1 capsule (40 mg total) by mouth daily. 30 capsule 3  . ondansetron (ZOFRAN ODT) 8 MG disintegrating tablet Take 1 tablet (8 mg total) by mouth every 8 (eight) hours as needed for nausea or vomiting. 20 tablet 0  . phenazopyridine (PYRIDIUM) 200 MG tablet Take 200 mg by mouth 3 (three) times daily as  needed.    . promethazine (PHENERGAN) 25 MG suppository Place 1 suppository rectally every 6 hours as needed for nausea and vomiting not controlled by oral Zofran. 12 each 0  . tamsulosin (FLOMAX) 0.4 MG CAPS capsule Take 0.4 mg by mouth daily.    . Testosterone (ANDROGEL) 20.25 MG/1.25GM (1.62%) GEL Place 4 Act onto the skin daily. 1.25 g 0   No current  facility-administered medications on file prior to visit.   Allergies  Allergen Reactions  . Metformin Diarrhea  . Metformin And Related   . Reglan [Metoclopramide]     No results found for this or any previous visit (from the past 2160 hour(s)).  Objective: General: Patient is awake, alert, and oriented x 3 and in no acute distress.  Integument: Skin is warm, dry and supple bilateral. Nails are short and well manicured, 1-5 bilateral. No signs of infection. No open lesions or preulcerative lesions present bilateral. Remaining integument unremarkable.  Vasculature:  Dorsalis Pedis pulse 1/4 bilateral. Posterior Tibial pulse  1/4 bilateral.  Capillary fill time <3 sec 1-5 bilateral. Positive hair growth to the level of the digits. Temperature gradient within normal limits. No varicosities present bilateral. No edema present bilateral.   Neurology: The patient has diminished sensation measured with a 5.07/10g Semmes Weinstein Monofilament at all pedal sites bilateral . Vibratory sensation absent bilateral with tuning fork. No Babinski sign present bilateral.   Musculoskeletal: Asymptomatic pes planus and limited 1st MTPJ range of motion, pedal deformities noted bilateral. Muscular strength 5/5 in all lower extremity muscular groups bilateral without pain on range of motion . No tenderness with calf compression bilateral.  Assessment and Plan: Problem List Items Addressed This Visit    None    Visit Diagnoses    Encounter for comprehensive diabetic foot examination, type 2 diabetes mellitus (HCC)    -  Primary   Relevant Medications   insulin glargine (LANTUS SOLOSTAR) 100 UNIT/ML Solostar Pen   rosuvastatin (CRESTOR) 5 MG tablet   Pes planus of both feet       Hallux limitus, unspecified laterality         -Examined patient. -Discussed and educated patient on diabetic foot care, especially with  regards to the vascular, neurological and musculoskeletal systems.  -Stressed the  importance of good glycemic control and the detriment of not  controlling glucose levels in relation to the foot. -Patient to return to office for diabetic shoe measurements  -Answered all patient questions -Patient advised to call the office if any problems or questions arise in the meantime.  Asencion Islam, DPM

## 2020-08-23 NOTE — Patient Instructions (Signed)
Diabetes Mellitus and Foot Care Foot care is an important part of your health, especially when you have diabetes. Diabetes may cause you to have problems because of poor blood flow (circulation) to your feet and legs, which can cause your skin to:  Become thinner and drier.  Break more easily.  Heal more slowly.  Peel and crack. You may also have nerve damage (neuropathy) in your legs and feet, causing decreased feeling in them. This means that you may not notice minor injuries to your feet that could lead to more serious problems. Noticing and addressing any potential problems early is the best way to prevent future foot problems. How to care for your feet Foot hygiene  Wash your feet daily with warm water and mild soap. Do not use hot water. Then, pat your feet and the areas between your toes until they are completely dry. Do not soak your feet as this can dry your skin.  Trim your toenails straight across. Do not dig under them or around the cuticle. File the edges of your nails with an emery board or nail file.  Apply a moisturizing lotion or petroleum jelly to the skin on your feet and to dry, brittle toenails. Use lotion that does not contain alcohol and is unscented. Do not apply lotion between your toes. Shoes and socks  Wear clean socks or stockings every day. Make sure they are not too tight. Do not wear knee-high stockings since they may decrease blood flow to your legs.  Wear shoes that fit properly and have enough cushioning. Always look in your shoes before you put them on to be sure there are no objects inside.  To break in new shoes, wear them for just a few hours a day. This prevents injuries on your feet. Wounds, scrapes, corns, and calluses  Check your feet daily for blisters, cuts, bruises, sores, and redness. If you cannot see the bottom of your feet, use a mirror or ask someone for help.  Do not cut corns or calluses or try to remove them with medicine.  If you  find a minor scrape, cut, or break in the skin on your feet, keep it and the skin around it clean and dry. You may clean these areas with mild soap and water. Do not clean the area with peroxide, alcohol, or iodine.  If you have a wound, scrape, corn, or callus on your foot, look at it several times a day to make sure it is healing and not infected. Check for: ? Redness, swelling, or pain. ? Fluid or blood. ? Warmth. ? Pus or a bad smell. General instructions  Do not cross your legs. This may decrease blood flow to your feet.  Do not use heating pads or hot water bottles on your feet. They may burn your skin. If you have lost feeling in your feet or legs, you may not know this is happening until it is too late.  Protect your feet from hot and cold by wearing shoes, such as at the beach or on hot pavement.  Schedule a complete foot exam at least once a year (annually) or more often if you have foot problems. If you have foot problems, report any cuts, sores, or bruises to your health care provider immediately. Contact a health care provider if:  You have a medical condition that increases your risk of infection and you have any cuts, sores, or bruises on your feet.  You have an injury that is not   healing.  You have redness on your legs or feet.  You feel burning or tingling in your legs or feet.  You have pain or cramps in your legs and feet.  Your legs or feet are numb.  Your feet always feel cold.  You have pain around a toenail. Get help right away if:  You have a wound, scrape, corn, or callus on your foot and: ? You have pain, swelling, or redness that gets worse. ? You have fluid or blood coming from the wound, scrape, corn, or callus. ? Your wound, scrape, corn, or callus feels warm to the touch. ? You have pus or a bad smell coming from the wound, scrape, corn, or callus. ? You have a fever. ? You have a red line going up your leg. Summary  Check your feet every day  for cuts, sores, red spots, swelling, and blisters.  Moisturize feet and legs daily.  Wear shoes that fit properly and have enough cushioning.  If you have foot problems, report any cuts, sores, or bruises to your health care provider immediately.  Schedule a complete foot exam at least once a year (annually) or more often if you have foot problems. This information is not intended to replace advice given to you by your health care provider. Make sure you discuss any questions you have with your health care provider. Document Revised: 07/21/2019 Document Reviewed: 11/29/2016 Elsevier Patient Education  2020 [employer]  

## 2020-08-24 ENCOUNTER — Other Ambulatory Visit: Payer: Self-pay | Admitting: Sports Medicine

## 2020-08-24 ENCOUNTER — Telehealth: Payer: Self-pay | Admitting: Sports Medicine

## 2020-08-24 MED ORDER — GABAPENTIN 300 MG PO CAPS
300.0000 mg | ORAL_CAPSULE | Freq: Three times a day (TID) | ORAL | 3 refills | Status: DC
Start: 2020-08-24 — End: 2021-08-30

## 2020-08-24 NOTE — Progress Notes (Signed)
Gabapentin refilled

## 2020-08-24 NOTE — Telephone Encounter (Signed)
Patient called and requested prescription for 400 mg Gabapentin for nerves in both feet, patient uses [facility]

## 2020-08-24 NOTE — Telephone Encounter (Signed)
Refille sent for Gabapentin

## 2020-09-20 ENCOUNTER — Other Ambulatory Visit: Payer: Self-pay

## 2020-09-20 ENCOUNTER — Ambulatory Visit: Payer: 59 | Admitting: Orthotics

## 2020-09-20 DIAGNOSIS — M205X9 Other deformities of toe(s) (acquired), unspecified foot: Secondary | ICD-10-CM

## 2020-09-20 DIAGNOSIS — M2141 Flat foot [pes planus] (acquired), right foot: Secondary | ICD-10-CM

## 2020-09-20 DIAGNOSIS — E1165 Type 2 diabetes mellitus with hyperglycemia: Secondary | ICD-10-CM

## 2020-09-20 DIAGNOSIS — M2142 Flat foot [pes planus] (acquired), left foot: Secondary | ICD-10-CM

## 2020-09-20 DIAGNOSIS — E119 Type 2 diabetes mellitus without complications: Secondary | ICD-10-CM

## 2020-09-20 NOTE — Progress Notes (Signed)

## 2020-10-16 ENCOUNTER — Telehealth: Payer: Self-pay | Admitting: Sports Medicine

## 2020-10-16 NOTE — Telephone Encounter (Signed)
Pt called checking status of diabetic shoes.  Upon checking website. Notified pt we did get paperwork on 11.18 and the inserts are in production and they should be shipping shortly

## 2020-11-22 ENCOUNTER — Ambulatory Visit (INDEPENDENT_AMBULATORY_CARE_PROVIDER_SITE_OTHER): Payer: Medicare Other | Admitting: Orthotics

## 2020-11-22 ENCOUNTER — Other Ambulatory Visit: Payer: Self-pay

## 2020-11-22 DIAGNOSIS — M2042 Other hammer toe(s) (acquired), left foot: Secondary | ICD-10-CM | POA: Diagnosis not present

## 2020-11-22 DIAGNOSIS — E1142 Type 2 diabetes mellitus with diabetic polyneuropathy: Secondary | ICD-10-CM | POA: Diagnosis not present

## 2020-11-22 DIAGNOSIS — M2141 Flat foot [pes planus] (acquired), right foot: Secondary | ICD-10-CM

## 2020-11-22 DIAGNOSIS — M205X9 Other deformities of toe(s) (acquired), unspecified foot: Secondary | ICD-10-CM

## 2020-11-22 DIAGNOSIS — M2142 Flat foot [pes planus] (acquired), left foot: Secondary | ICD-10-CM

## 2020-11-22 NOTE — Progress Notes (Signed)

## 2021-08-30 ENCOUNTER — Emergency Department (HOSPITAL_COMMUNITY): Payer: Medicare Other

## 2021-08-30 ENCOUNTER — Encounter (HOSPITAL_COMMUNITY): Payer: Self-pay | Admitting: Emergency Medicine

## 2021-08-30 ENCOUNTER — Emergency Department (HOSPITAL_COMMUNITY)
Admission: EM | Admit: 2021-08-30 | Discharge: 2021-08-30 | Disposition: A | Discharge status: Home / Self Care | Payer: Medicare Other | Attending: Emergency Medicine | Admitting: Emergency Medicine

## 2021-08-30 DIAGNOSIS — E119 Type 2 diabetes mellitus without complications: Secondary | ICD-10-CM | POA: Insufficient documentation

## 2021-08-30 DIAGNOSIS — R1010 Upper abdominal pain, unspecified: Secondary | ICD-10-CM | POA: Insufficient documentation

## 2021-08-30 DIAGNOSIS — I10 Essential (primary) hypertension: Secondary | ICD-10-CM | POA: Insufficient documentation

## 2021-08-30 DIAGNOSIS — Z794 Long term (current) use of insulin: Secondary | ICD-10-CM | POA: Diagnosis not present

## 2021-08-30 DIAGNOSIS — Z79899 Other long term (current) drug therapy: Secondary | ICD-10-CM | POA: Diagnosis not present

## 2021-08-30 LAB — CBC
HCT: 34.9 % — ABNORMAL LOW (ref 39.0–52.0)
Hemoglobin: 11.9 g/dL — ABNORMAL LOW (ref 13.0–17.0)
MCH: 28.4 pg (ref 26.0–34.0)
MCHC: 34.1 g/dL (ref 30.0–36.0)
MCV: 83.3 fL (ref 80.0–100.0)
Platelets: 291 10*3/uL (ref 150–400)
RBC: 4.19 MIL/uL — ABNORMAL LOW (ref 4.22–5.81)
RDW: 12.7 % (ref 11.5–15.5)
WBC: 9.7 10*3/uL (ref 4.0–10.5)
nRBC: 0 % (ref 0.0–0.2)

## 2021-08-30 LAB — BASIC METABOLIC PANEL
Anion gap: 10 (ref 5–15)
BUN: 23 mg/dL — ABNORMAL HIGH (ref 6–20)
CO2: 25 mmol/L (ref 22–32)
Calcium: 9.3 mg/dL (ref 8.9–10.3)
Chloride: 99 mmol/L (ref 98–111)
Creatinine, Ser: 1.37 mg/dL — ABNORMAL HIGH (ref 0.61–1.24)
GFR, Estimated: >60 mL/min (ref >60)
Glucose, Bld: 349 mg/dL — ABNORMAL HIGH (ref 70–99)
Potassium: 4 mmol/L (ref 3.5–5.1)
Sodium: 134 mmol/L — ABNORMAL LOW (ref 135–145)

## 2021-08-30 LAB — RAPID URINE DRUG SCREEN, HOSP PERFORMED
Amphetamines: NOT DETECTED
Barbiturates: NOT DETECTED
Benzodiazepines: NOT DETECTED
Cocaine: POSITIVE — AB
Opiates: POSITIVE — AB
Tetrahydrocannabinol: NOT DETECTED

## 2021-08-30 LAB — TROPONIN I (HIGH SENSITIVITY)
Troponin I (High Sensitivity): 6 ng/L (ref <18)
Troponin I (High Sensitivity): 6 ng/L (ref <18)

## 2021-08-30 LAB — LIPASE, BLOOD: Lipase: 21 U/L (ref 11–51)

## 2021-08-30 MED ORDER — IOHEXOL 350 MG/ML SOLN
80.0000 mL | Freq: Once | INTRAVENOUS | Status: AC | PRN
  Administered 2021-08-30: 80 mL via INTRAVENOUS

## 2021-08-30 MED ORDER — MORPHINE SULFATE (PF) 4 MG/ML IV SOLN
4.0000 mg | Freq: Once | INTRAVENOUS | Status: AC
  Administered 2021-08-30: 4 mg via INTRAVENOUS
  Filled 2021-08-30: qty 1

## 2021-08-30 MED ORDER — SODIUM CHLORIDE 0.9 % IV BOLUS
1000.0000 mL | Freq: Once | INTRAVENOUS | Status: AC
  Administered 2021-08-30: 1000 mL via INTRAVENOUS

## 2021-08-30 MED ORDER — ONDANSETRON HCL 4 MG/2ML IJ SOLN
4.0000 mg | Freq: Once | INTRAMUSCULAR | Status: AC
  Administered 2021-08-30: 4 mg via INTRAVENOUS
  Filled 2021-08-30: qty 2

## 2021-08-30 NOTE — ED Triage Notes (Signed)
Patient arrives ambulatory via ems with midline abdominal pain x3 months that has worsened over the last 3 days. Patient states unable to keep down PO. Patient states pain is midline and constant. Patient waiting to see GI.    128/78 364 DM no meds 90 16 98

## 2021-08-30 NOTE — Discharge Instructions (Signed)
Your blood work and CT scan of your abdomen was reassuring.  You did have constipation noted on your CT scan of your abdomen.  I recommend taking MiraLAX as discussed along with your suppository that you already have.  If you develop fever, persistent nausea vomiting and are unable to keep any food or drink down please return to the emergency room.

## 2021-08-30 NOTE — ED Provider Notes (Signed)
[facility]-EMERGENCY DEPT Provider Note   CSN: [id number] Arrival date & time: 08/30/21  7106     History Chief Complaint  Patient presents with   Abdominal Pain    James Chang is a 55 y.o. male.  55 year old male presents to the ED via EMS for evaluation of 2 days of abdominal pain associated with nausea vomiting and unable to tolerate p.o. intake.  He reports his last intake was last night and took his normal insulin at that time as well.  He reports she has a history of mild abdominal pain for 3 months duration but it has acutely worsened.  He states he is somewhat constipated but is having small bowel movements daily.  Also endorses nonradiating aching left-sided  chest pain onset this morning. He denies fever, chills, lightheadedness, dyspnea, diaphoresis, or urinary complaints.  He does endorse recent cocaine use 3 days ago.  Denies other substance abuse or alcohol use.  He has not tried anything over-the-counter for this.  The history is provided by the patient. No language interpreter was used.      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   Substance induced mood disorder (HCC) 07/19/2020   Intractable vomiting with nausea 12/26/2019   Vitamin D deficiency 12/23/2019   Diabetic gastroparesis (HCC) 10/28/2019   Other constipation 10/28/2019   Proliferative diabetic retinopathy of left eye with macular edema associated with type 2 diabetes mellitus (HCC) 05/19/2019   Bipolar affective disorder in remission (HCC) 03/30/2019   Diabetic retinopathy of right eye with macular edema associated with type 2 diabetes mellitus (HCC) 03/30/2019   Fatigue 03/30/2019    Snoring 03/30/2019   Uncontrolled type 2 diabetes mellitus with hyperglycemia (HCC) 03/30/2019   Cocaine use disorder, severe, dependence (HCC) 01/14/2019   Bursitis of right foot 09/29/2018   Metatarsalgia of right foot 09/29/2018   Biceps tendonitis on right 12/11/2017   Tendinitis of right rotator cuff 11/26/2017   Noncompliance with diabetes treatment 12/26/2016   Bulging of lumbar intervertebral disc 12/06/2016   Hammer toe of left foot 03/12/2016   Current use of insulin (HCC) 01/01/2016   Osteoarthritis of spine with radiculopathy, lumbar region 11/15/2014   Chronic pain syndrome 08/18/2014   Diabetes, polyneuropathy (HCC) 08/18/2014   Esophageal reflux 12/16/2013   Schizoaffective disorder (HCC) 04/17/2013   Peripheral neuropathy 11/24/2012   Type 2 diabetes, uncontrolled, with retinopathy 09/30/2012   Hyperlipidemia 09/30/2012   Chronic low back pain 09/30/2012   Low testosterone 09/30/2012   Depression 09/30/2012   History of stroke 09/30/2012   HTN (hypertension) 09/30/2012    Past Surgical History:  Procedure Laterality Date   NO PAST SURGERIES         Family History  Problem Relation Age of Onset   Arthritis Mother    Stroke Father    Hypertension Father    Kidney disease Father    Heart attack Father    Cancer Neg Hx    Diabetes Neg Hx     Social History   Tobacco Use   Smoking status: Never   Smokeless tobacco: Never  Substance Use Topics   Alcohol use: Yes  Comment: seldom 1-2 times a month beer   Drug use: Yes    Types: Cocaine    Comment: "a couple days ago"    Home Medications Prior to Admission medications   Medication Sig Start Date End Date Taking? Authorizing Provider  gabapentin (NEURONTIN) 300 MG capsule Take 1 capsule (300 mg total) by mouth 3 (three) times daily. 08/24/20   Asencion Islam, DPM  hydrOXYzine (ATARAX/VISTARIL) 10 MG tablet Take by mouth.    [provider]  insulin glargine (LANTUS SOLOSTAR) 100  UNIT/ML Solostar Pen Inject into the skin. 04/24/18   [provider]  Lancets 30G MISC by Does not apply route.    [provider]  lisinopril (PRINIVIL,ZESTRIL) 10 MG tablet Take 10 mg by mouth daily.    [provider]  pantoprazole (PROTONIX) 40 MG tablet Take by mouth. 08/08/20   [provider]    Allergies    Metformin, Metformin and related, and Reglan [metoclopramide]  Review of Systems   Review of Systems  Constitutional:  Positive for appetite change. Negative for activity change, chills, diaphoresis and fever.  Respiratory:  Negative for cough and shortness of breath.   Cardiovascular:  Negative for chest pain and palpitations.  Gastrointestinal:  Positive for abdominal pain, constipation, nausea and vomiting. Negative for diarrhea.  Genitourinary:  Negative for difficulty urinating and dysuria.  Neurological:  Negative for light-headedness and headaches.  All other systems reviewed and are negative.  Physical Exam Updated Vital Signs BP (!) 161/93 (BP Location: Right Arm)   Pulse 90   Temp 98.2 F (36.8 C) (Oral)   Resp 18   Ht 5\' 6"  (1.676 m)   Wt 83.5 kg   SpO2 100%   BMI 29.70 kg/m   Physical Exam Vitals and nursing note reviewed.  Constitutional:      General: He is not in acute distress.    Appearance: Normal appearance. He is not ill-appearing.  HENT:     Head: Normocephalic and atraumatic.     Nose: Nose normal.  Eyes:     General: No scleral icterus.    Extraocular Movements: Extraocular movements intact.     Conjunctiva/sclera: Conjunctivae normal.  Cardiovascular:     Rate and Rhythm: Normal rate and regular rhythm.     Pulses: Normal pulses.     Heart sounds: Normal heart sounds.  Pulmonary:     Effort: Pulmonary effort is normal. No respiratory distress.     Breath sounds: Normal breath sounds. No wheezing or rales.  Abdominal:     General: There is no distension.     Tenderness: There is abdominal  tenderness in the right upper quadrant, epigastric area and left upper quadrant. There is no guarding.  Musculoskeletal:        General: Normal range of motion.     Cervical back: Normal range of motion.  Skin:    General: Skin is warm and dry.  Neurological:     General: No focal deficit present.     Mental Status: He is alert. Mental status is at baseline.    ED Results / Procedures / Treatments   Labs (all labs ordered are listed, but only abnormal results are displayed) Labs Reviewed  LIPASE, BLOOD  RAPID URINE DRUG SCREEN, HOSP PERFORMED  TROPONIN I (HIGH SENSITIVITY)    EKG None  Radiology No results found.  Procedures Procedures   Medications Ordered in ED Medications  ondansetron (ZOFRAN) injection 4 mg (has no administration in time range)  morphine 4 MG/ML injection 4 mg (has no administration in time range)  sodium chloride 0.9 % bolus 1,000 mL (has no administration in time range)    ED Course  I have reviewed the triage vital signs and the nursing notes.  Pertinent labs & imaging results that were available during my care of the patient were reviewed by me and considered in my medical decision making (see chart for details).    MDM Rules/Calculators/A&P                           55 year old male presents for worsening abdominal pain in the past 3 days along with chest pain onset this morning.  Work-up in the emergency room overall reassuring.  BMP with creatinine of 1.37 and BUN of 23 which is stable with his baseline could also indicate dehydration consistent with his history.  Glucose of 349.  CBC significant for hemoglobin of 11.9 also similar to his baseline.  Lipase 21.  Troponin negative x2.  EKG without acute ischemic changes.  CT abdomen pelvis negative for any acute intra-abdominal processes, but notes presence of retained stool throughout his large bowel.  His abdominal pain is likely from his constipation.  Discussed aggressive bowel regimen with  him.  Return precautions discussed with him as well.  Given the above work-up unlikely that this is ACS or other acute abdominal process.  He voices understanding and is in agreement with plan.  Discussed compliance with his insulin regimen.  Final Clinical Impression(s) / ED Diagnoses Final diagnoses:  None    Rx / DC Orders ED Discharge Orders     None        Marita Kansas, PA-C 08/30/21 1029    Tegeler, Canary Brim, MD 08/30/21 1115

## 2021-08-31 ENCOUNTER — Other Ambulatory Visit: Payer: Self-pay

## 2021-08-31 ENCOUNTER — Emergency Department (HOSPITAL_COMMUNITY): Payer: Medicare Other

## 2021-08-31 ENCOUNTER — Emergency Department (HOSPITAL_COMMUNITY)
Admission: EM | Admit: 2021-08-31 | Discharge: 2021-08-31 | Disposition: A | Discharge status: Home / Self Care | Payer: Medicare Other | Attending: Emergency Medicine | Admitting: Emergency Medicine

## 2021-08-31 ENCOUNTER — Encounter (HOSPITAL_COMMUNITY): Payer: Self-pay

## 2021-08-31 DIAGNOSIS — R739 Hyperglycemia, unspecified: Secondary | ICD-10-CM | POA: Insufficient documentation

## 2021-08-31 DIAGNOSIS — K3184 Gastroparesis: Secondary | ICD-10-CM | POA: Diagnosis not present

## 2021-08-31 DIAGNOSIS — R109 Unspecified abdominal pain: Secondary | ICD-10-CM | POA: Diagnosis present

## 2021-08-31 DIAGNOSIS — R112 Nausea with vomiting, unspecified: Secondary | ICD-10-CM

## 2021-08-31 LAB — CBC
HCT: 36.3 % — ABNORMAL LOW (ref 39.0–52.0)
Hemoglobin: 12.3 g/dL — ABNORMAL LOW (ref 13.0–17.0)
MCH: 28.3 pg (ref 26.0–34.0)
MCHC: 33.9 g/dL (ref 30.0–36.0)
MCV: 83.6 fL (ref 80.0–100.0)
Platelets: 326 10*3/uL (ref 150–400)
RBC: 4.34 MIL/uL (ref 4.22–5.81)
RDW: 12.5 % (ref 11.5–15.5)
WBC: 8.5 10*3/uL (ref 4.0–10.5)
nRBC: 0 % (ref 0.0–0.2)

## 2021-08-31 LAB — COMPREHENSIVE METABOLIC PANEL
ALT: 12 U/L (ref 0–44)
AST: 17 U/L (ref 15–41)
Albumin: 3.8 g/dL (ref 3.5–5.0)
Alkaline Phosphatase: 58 U/L (ref 38–126)
Anion gap: 10 (ref 5–15)
BUN: 21 mg/dL — ABNORMAL HIGH (ref 6–20)
CO2: 23 mmol/L (ref 22–32)
Calcium: 9.7 mg/dL (ref 8.9–10.3)
Chloride: 98 mmol/L (ref 98–111)
Creatinine, Ser: 1.49 mg/dL — ABNORMAL HIGH (ref 0.61–1.24)
GFR, Estimated: 55 mL/min — ABNORMAL LOW (ref >60)
Glucose, Bld: 330 mg/dL — ABNORMAL HIGH (ref 70–99)
Potassium: 5.1 mmol/L (ref 3.5–5.1)
Sodium: 131 mmol/L — ABNORMAL LOW (ref 135–145)
Total Bilirubin: 2 mg/dL — ABNORMAL HIGH (ref 0.3–1.2)
Total Protein: 6.9 g/dL (ref 6.5–8.1)

## 2021-08-31 LAB — CBG MONITORING, ED: Glucose-Capillary: 280 mg/dL — ABNORMAL HIGH (ref 70–99)

## 2021-08-31 LAB — LIPASE, BLOOD: Lipase: 21 U/L (ref 11–51)

## 2021-08-31 MED ORDER — DROPERIDOL 2.5 MG/ML IJ SOLN
1.2500 mg | Freq: Once | INTRAMUSCULAR | Status: AC
  Administered 2021-08-31: 1.25 mg via INTRAVENOUS
  Filled 2021-08-31: qty 2

## 2021-08-31 MED ORDER — LACTATED RINGERS IV BOLUS
1000.0000 mL | Freq: Once | INTRAVENOUS | Status: AC
  Administered 2021-08-31: 1000 mL via INTRAVENOUS

## 2021-08-31 MED ORDER — PANTOPRAZOLE SODIUM 40 MG IV SOLR
40.0000 mg | Freq: Once | INTRAVENOUS | Status: AC
  Administered 2021-08-31: 40 mg via INTRAVENOUS
  Filled 2021-08-31: qty 40

## 2021-08-31 MED ORDER — ONDANSETRON 8 MG PO TBDP: 8.0000 mg | ORAL_TABLET | Freq: Three times a day (TID) | ORAL | 0 refills | Status: DC | PRN

## 2021-08-31 MED ORDER — LACTATED RINGERS IV BOLUS
500.0000 mL | Freq: Once | INTRAVENOUS | Status: AC
  Administered 2021-08-31: 500 mL via INTRAVENOUS

## 2021-08-31 MED ORDER — PROMETHAZINE HCL 25 MG/ML IJ SOLN
25.0000 mg | Freq: Four times a day (QID) | INTRAMUSCULAR | Status: DC | PRN
Start: 2021-08-31 — End: 2021-08-31
  Administered 2021-08-31: 25 mg via INTRAVENOUS
  Filled 2021-08-31: qty 1

## 2021-08-31 NOTE — ED Notes (Signed)
Pt dry heaving. No vomit in bag.

## 2021-08-31 NOTE — ED Notes (Addendum)
Pt continues to bend arm. IV fluids going in very slow

## 2021-08-31 NOTE — ED Notes (Signed)
Pt ambulated to restroom with slow steady gait. One person stand by assist for safety. Pt returned, on monitors

## 2021-08-31 NOTE — ED Provider Notes (Signed)
[facility] EMERGENCY DEPARTMENT Provider Note   CSN: [id number] Arrival date & time: 08/31/21  0825     History Chief Complaint  Patient presents with   Abdominal Pain    James Chang is a 55 y.o. male.  HPI  55 yo male presents today complaining of abdominal pain, nausea, and vomiting.  He states that the abdominal pain began approximately 4 days ago.  He has had multiple episodes of nausea and vomiting with any p.o. intake.  He states this is where he vomits back up what he took in.  He denies any blood, bile, or dark coffee-ground material.  He states he has had episodes like this in the past but is not clear what they were.  He was seen at the [facility] long emergency department yesterday at that time had CT of the abdomen as well as labs, IV morphine, IV fluids, and Zofran.  He reports he has been taking his home medications.  He denies any abnormal bowel movements with the last bowel movement being normal yesterday.  He denies any fever, chills, cough, change in urinary habits.  He denies alcohol or tobacco use.  He endorses that he has used cocaine.  Review of records reveal last use 4 days ago.  Review of records also reveal history of alcohol abuse in the past with the last documented in July.  EMS reports that he is staying in a hotel.  Patient states he has a history of esophagitis.  Review of record from June 22, 2021 at [facility] showed that he came in with upper abdominal pain, nausea, and vomiting.  At that time they plan to admit him due to concern for GI bleeding.  He left the emergency department and was now admitted because he was hungry and was getting lifting information about whether or not he  There is no evidence going to be able to eat.  He presented again the next day.  At that time, he appears stable and they were able to discharge him back to home.  Review of records reveal a repeat admission in August 18 which time he was hyperglycemic and had  labs performed as well as medications and fluids receivedhyperglycemia and associated hyponatremia.  Of DKA.  He also had a psychiatric evaluation at that time and was eventually discharged to home.  At that time, patient was sitting in the [facility] he was awaiting a housing voucher.  On 911 he again presented with abdominal pain, nausea, vomiting, and chills.  At that time he was thought to have constipation and then began to have hyperglycemia due to noncompliance with his medications.  CT of the abdomen was obtained that showed constipation and no small bowel obstruction.  He was discharged and presented again the next day on 07/23/2021.  He again complained of epigastric abdominal pain ongoing reported at that time for 2 days.  Patient not taking the MiraLAX he had been discharged on reported that he had 2 bowel movements after leaving the ED.  Further he was out of his reflux medications.  He had GI follow-up at the end of September reported to be occurring at that time.  Blood sugar was elevated and corrected.  He was discharged home in improved condition.  On 914 he again presented with nausea and vomiting.  He again had labs obtained, CT that showed a large amount of solid stool in the colon, no other acute abnormalities.  At that time, he was  diagnosed with diabetic gastroparesis actable nausea and vomiting.  At that time, he reportedly episodes occurring about once every 4 months.  He was eventually admitted from 9/23 to 08/06/2021 with dehydration, suicidal ideation, cocaine abuse, hyperglycemia, and noncompliance with medications.  He then presented within the [facility] system for the first time yesterday.  Past Medical History:  Diagnosis Date   Depression    Diabetes mellitus without complication (HCC)    History of migraine    Hyperlipidemia    Hypertension    Personal history of drug abuse (HCC)    Stroke (HCC) 2008    Patient Active Problem List   Diagnosis Date Noted   Psychoactive  substance-induced psychosis (HCC) 07/19/2020   Substance induced mood disorder (HCC) 07/19/2020   Intractable vomiting with nausea 12/26/2019   Vitamin D deficiency 12/23/2019   Diabetic gastroparesis (HCC) 10/28/2019   Other constipation 10/28/2019   Proliferative diabetic retinopathy of left eye with macular edema associated with type 2 diabetes mellitus (HCC) 05/19/2019   Bipolar affective disorder in remission (HCC) 03/30/2019   Diabetic retinopathy of right eye with macular edema associated with type 2 diabetes mellitus (HCC) 03/30/2019   Fatigue 03/30/2019   Snoring 03/30/2019   Uncontrolled type 2 diabetes mellitus with hyperglycemia (HCC) 03/30/2019   Cocaine use disorder, severe, dependence (HCC) 01/14/2019   Bursitis of right foot 09/29/2018   Metatarsalgia of right foot 09/29/2018   Biceps tendonitis on right 12/11/2017   Tendinitis of right rotator cuff 11/26/2017   Noncompliance with diabetes treatment 12/26/2016   Bulging of lumbar intervertebral disc 12/06/2016   Hammer toe of left foot 03/12/2016   Current use of insulin (HCC) 01/01/2016   Osteoarthritis of spine with radiculopathy, lumbar region 11/15/2014   Chronic pain syndrome 08/18/2014   Diabetes, polyneuropathy (HCC) 08/18/2014   Esophageal reflux 12/16/2013   Schizoaffective disorder (HCC) 04/17/2013   Peripheral neuropathy 11/24/2012   Type 2 diabetes, uncontrolled, with retinopathy 09/30/2012   Hyperlipidemia 09/30/2012   Chronic low back pain 09/30/2012   Low testosterone 09/30/2012   Depression 09/30/2012   History of stroke 09/30/2012   HTN (hypertension) 09/30/2012    Past Surgical History:  Procedure Laterality Date   NO PAST SURGERIES         Family History  Problem Relation Age of Onset   Arthritis Mother    Stroke Father    Hypertension Father    Kidney disease Father    Heart attack Father    Cancer Neg Hx    Diabetes Neg Hx     Social History   Tobacco Use   Smoking status:  Never   Smokeless tobacco: Never  Substance Use Topics   Alcohol use: Yes    Comment: seldom 1-2 times a month beer   Drug use: Yes    Types: Cocaine    Comment: "a couple days ago"    Home Medications Prior to Admission medications   Medication Sig Start Date End Date Taking? Authorizing Provider  FLUoxetine (PROZAC) 10 MG capsule Take 10 mg by mouth daily. 06/29/21   [provider]  insulin glargine (LANTUS SOLOSTAR) 100 UNIT/ML Solostar Pen Inject 40 Units into the skin at bedtime. 04/24/18   [provider]  insulin lispro (HUMALOG) 100 UNIT/ML injection Inject 5 Units into the skin 3 (three) times daily with meals. 08/06/21   [provider]  lisinopril (PRINIVIL,ZESTRIL) 10 MG tablet Take 10 mg by mouth daily.    [provider]  losartan (COZAAR) 50  MG tablet Take 50 mg by mouth daily. 05/23/21   [provider]  pantoprazole (PROTONIX) 40 MG tablet Take 40 mg by mouth daily. 08/08/20   [provider]  QUEtiapine (SEROQUEL) 25 MG tablet Take 75 mg by mouth at bedtime as needed. 08/06/21   [provider]    Allergies    Metformin, Metformin and related, and Reglan [metoclopramide]  Review of Systems   Review of Systems  Physical Exam Updated Vital Signs There were no vitals taken for this visit.  Physical Exam Vitals and nursing note reviewed.  Constitutional:      Appearance: He is well-developed.  HENT:     Head: Normocephalic.     Mouth/Throat:     Mouth: Mucous membranes are moist.     Pharynx: Oropharynx is clear.  Eyes:     Extraocular Movements: Extraocular movements intact.  Cardiovascular:     Rate and Rhythm: Regular rhythm.  Pulmonary:     Effort: Pulmonary effort is normal.     Breath sounds: Normal breath sounds.  Abdominal:     General: Abdomen is flat. Bowel sounds are normal.     Palpations: Abdomen is soft.     Tenderness: There is abdominal tenderness in the epigastric area.   Skin:    General: Skin is warm and dry.     Capillary Refill: Capillary refill takes less than 2 seconds.  Neurological:     General: No focal deficit present.     Mental Status: He is alert.    ED Results / Procedures / Treatments   Labs (all labs ordered are listed, but only abnormal results are displayed) Labs Reviewed - No data to display  EKG None  Radiology CT ABDOMEN PELVIS W CONTRAST  Result Date: 08/30/2021 CLINICAL DATA:  55 year old male with midline abdominal pain, 3 months but progressed over the past 3 days. EXAM: CT ABDOMEN AND PELVIS WITH CONTRAST TECHNIQUE: Multidetector CT imaging of the abdomen and pelvis was performed using the standard protocol following bolus administration of intravenous contrast. CONTRAST:  59mL OMNIPAQUE IOHEXOL 350 MG/ML SOLN COMPARISON:  [facility] CT Abdomen and Pelvis 07/22/2021 and earlier. FINDINGS: Lower chest: Negative aside from mild left lung base atelectasis. Hepatobiliary: Negative liver and gallbladder. Pancreas: Negative. Spleen: Negative. Adrenals/Urinary Tract: Normal adrenal glands. Previously demonstrated punctate nephrolithiasis is less evident today with IV contrast. Kidneys enhance symmetrically and are nonobstructed. Tiny left renal midpole cyst now apparent. Symmetric contrast excretion on the delayed images. Normal ureters. Unremarkable bladder. Stomach/Bowel: Redundant large bowel with retained stool similar to exams earlier this year. Appendix remains normal on series 2, image 59. No large bowel inflammation. Negative terminal ileum. No dilated small bowel. Stomach and duodenum are within normal limits. No free air, free fluid or mesenteric inflammation. Vascular/Lymphatic: Major arterial structures in the abdomen and pelvis appear patent with minimal atherosclerosis. Portal venous system is patent. No lymphadenopathy. Reproductive: Negative. Other: No pelvic free fluid.  Musculoskeletal: Stable. No acute osseous abnormality identified. Lumbar facet degeneration. IMPRESSION: 1. No acute or inflammatory process identified in the abdomen or pelvis. 2. Redundant large bowel with retained stool similar prior CTs. 3. Mild bilateral nephrolithiasis, less apparent with IV contrast on board today. Electronically Signed   By: Odessa Fleming M.D.   On: 08/30/2021 09:02   DG Chest Portable 1 View  Result Date: 08/30/2021 CLINICAL DATA:  Pt c/o midline abd pain x 3 months, worsened x 3 days. EXAM: PORTABLE CHEST -  1 VIEW COMPARISON:  06/08/2021 FINDINGS: Lungs are clear. Heart size and mediastinal contours are within normal limits. No effusion. Visualized bones unremarkable. IMPRESSION: No acute cardiopulmonary disease. Electronically Signed   By: Corlis Leak M.D.   On: 08/30/2021 08:01    Procedures Procedures   Medications Ordered in ED Medications - No data to display  ED Course  I have reviewed the triage vital signs and the nursing notes.  Pertinent labs & imaging results that were available during my care of the patient were reviewed by me and considered in my medical decision making (see chart for details).    MDM Rules/Calculators/A&P                           55 year old male history of gastroparesis presents today with nausea and vomiting.  He has had dry heaving here but no actual vomiting.  He is received IV fluids and antiemetics.  Blood sugars initially elevated decreased from 3 30-80 with fluids only.  He is hypertensive but has not taken his blood pressure medications today.  He is reevaluated and appears improved.  He does not have any antiemetics at home.  Given prescription for Zofran.  He is advised to call of his home medications on discharge and return if he has having worsening symptoms.  He has outpatient follow-up set up prior to today and is advised to keep this appointment. Final Clinical Impression(s) / ED Diagnoses Final diagnoses:  Gastroparesis   Nausea and vomiting, unspecified vomiting type  Hyperglycemia    Rx / DC Orders ED Discharge Orders     None        Margarita Grizzle, MD 08/31/21 1434

## 2021-08-31 NOTE — Discharge Instructions (Addendum)
Blood pressure medicines. Pick up your nausea medicine and take as prescribed.

## 2021-08-31 NOTE — ED Triage Notes (Signed)
Pt to er room number 4 via ems, pt states that he is here for emesis and abd pain, states that he has vomited about 6 times in the past day, states that he has been feeling ill for the past 4 days.  Pt states that he was at [facility] yesterday.

## 2021-09-01 ENCOUNTER — Encounter (HOSPITAL_COMMUNITY): Payer: Self-pay

## 2021-09-01 ENCOUNTER — Other Ambulatory Visit: Payer: Self-pay

## 2021-09-01 ENCOUNTER — Emergency Department (HOSPITAL_COMMUNITY)
Admission: EM | Admit: 2021-09-01 | Discharge: 2021-09-01 | Disposition: A | Discharge status: Home / Self Care | Payer: Medicare Other | Attending: Emergency Medicine | Admitting: Emergency Medicine

## 2021-09-01 ENCOUNTER — Emergency Department (HOSPITAL_COMMUNITY): Payer: Medicare Other

## 2021-09-01 DIAGNOSIS — R112 Nausea with vomiting, unspecified: Secondary | ICD-10-CM | POA: Diagnosis not present

## 2021-09-01 DIAGNOSIS — E1143 Type 2 diabetes mellitus with diabetic autonomic (poly)neuropathy: Secondary | ICD-10-CM | POA: Insufficient documentation

## 2021-09-01 DIAGNOSIS — R109 Unspecified abdominal pain: Secondary | ICD-10-CM | POA: Insufficient documentation

## 2021-09-01 DIAGNOSIS — I10 Essential (primary) hypertension: Secondary | ICD-10-CM | POA: Diagnosis not present

## 2021-09-01 DIAGNOSIS — R739 Hyperglycemia, unspecified: Secondary | ICD-10-CM

## 2021-09-01 DIAGNOSIS — Z794 Long term (current) use of insulin: Secondary | ICD-10-CM | POA: Diagnosis not present

## 2021-09-01 DIAGNOSIS — R110 Nausea: Secondary | ICD-10-CM

## 2021-09-01 DIAGNOSIS — Z79899 Other long term (current) drug therapy: Secondary | ICD-10-CM | POA: Insufficient documentation

## 2021-09-01 DIAGNOSIS — F5089 Other specified eating disorder: Secondary | ICD-10-CM

## 2021-09-01 DIAGNOSIS — E11319 Type 2 diabetes mellitus with unspecified diabetic retinopathy without macular edema: Secondary | ICD-10-CM | POA: Diagnosis not present

## 2021-09-01 DIAGNOSIS — R569 Unspecified convulsions: Secondary | ICD-10-CM | POA: Insufficient documentation

## 2021-09-01 LAB — CBC
HCT: 35.3 % — ABNORMAL LOW (ref 39.0–52.0)
Hemoglobin: 12.3 g/dL — ABNORMAL LOW (ref 13.0–17.0)
MCH: 28.7 pg (ref 26.0–34.0)
MCHC: 34.8 g/dL (ref 30.0–36.0)
MCV: 82.5 fL (ref 80.0–100.0)
Platelets: 316 10*3/uL (ref 150–400)
RBC: 4.28 MIL/uL (ref 4.22–5.81)
RDW: 12.4 % (ref 11.5–15.5)
WBC: 11.3 10*3/uL — ABNORMAL HIGH (ref 4.0–10.5)
nRBC: 0 % (ref 0.0–0.2)

## 2021-09-01 LAB — COMPREHENSIVE METABOLIC PANEL
ALT: 12 U/L (ref 0–44)
AST: 12 U/L — ABNORMAL LOW (ref 15–41)
Albumin: 3.9 g/dL (ref 3.5–5.0)
Alkaline Phosphatase: 57 U/L (ref 38–126)
Anion gap: 13 (ref 5–15)
BUN: 29 mg/dL — ABNORMAL HIGH (ref 6–20)
CO2: 22 mmol/L (ref 22–32)
Calcium: 9.6 mg/dL (ref 8.9–10.3)
Chloride: 97 mmol/L — ABNORMAL LOW (ref 98–111)
Creatinine, Ser: 1.7 mg/dL — ABNORMAL HIGH (ref 0.61–1.24)
GFR, Estimated: 47 mL/min — ABNORMAL LOW (ref >60)
Glucose, Bld: 381 mg/dL — ABNORMAL HIGH (ref 70–99)
Potassium: 4 mmol/L (ref 3.5–5.1)
Sodium: 132 mmol/L — ABNORMAL LOW (ref 135–145)
Total Bilirubin: 1.6 mg/dL — ABNORMAL HIGH (ref 0.3–1.2)
Total Protein: 7 g/dL (ref 6.5–8.1)

## 2021-09-01 LAB — CBG MONITORING, ED
Glucose-Capillary: 357 mg/dL — ABNORMAL HIGH (ref 70–99)
Glucose-Capillary: 345 mg/dL — ABNORMAL HIGH (ref 70–99)

## 2021-09-01 LAB — ETHANOL: Alcohol, Ethyl (B): <10 mg/dL (ref <10)

## 2021-09-01 LAB — MAGNESIUM: Magnesium: 1.8 mg/dL (ref 1.7–2.4)

## 2021-09-01 MED ORDER — DROPERIDOL 2.5 MG/ML IJ SOLN
1.2500 mg | Freq: Once | INTRAMUSCULAR | Status: AC
  Administered 2021-09-01: 1.25 mg via INTRAVENOUS
  Filled 2021-09-01: qty 2

## 2021-09-01 MED ORDER — SODIUM CHLORIDE 0.9 % IV SOLN: INTRAVENOUS | Status: DC

## 2021-09-01 MED ORDER — ONDANSETRON HCL 4 MG/2ML IJ SOLN
4.0000 mg | Freq: Once | INTRAMUSCULAR | Status: AC
  Administered 2021-09-01: 4 mg via INTRAVENOUS
  Filled 2021-09-01: qty 2

## 2021-09-01 MED ORDER — HALOPERIDOL LACTATE 5 MG/ML IJ SOLN
2.5000 mg | Freq: Once | INTRAMUSCULAR | Status: DC | PRN
  Filled 2021-09-01: qty 1

## 2021-09-01 MED ORDER — SODIUM CHLORIDE 0.9 % IV BOLUS
1000.0000 mL | Freq: Once | INTRAVENOUS | Status: AC
Start: 2021-09-01 — End: 2021-09-01
  Administered 2021-09-01: 1000 mL via INTRAVENOUS

## 2021-09-01 NOTE — ED Notes (Signed)
Patient transported to CT 

## 2021-09-01 NOTE — ED Notes (Signed)
Checked patient cbg it was 345 notifred RN Paige of blood sugar patient is resting with call bell in reach

## 2021-09-01 NOTE — Discharge Instructions (Addendum)
STOP purposefully gagging yourself! This can WORSEN your need to vomit. Take antinausea medications and an acid reducer. TAKE your regular home meds for diabetes and high blood pressure. Avoid drugs and alcohol.  Continue frequent small sips (10-20 ml) of clear liquids every 5-10 minutes. Gatorade or powerade are good options. Avoid milk, orange juice, and grape juice for now. . Once you have not had further vomiting with the small sips for 4 hours, you may begin to drink larger volumes of fluids at a time and try a bland diet which may include saltine crackers, applesauce, breads, pastas, bananas, bland chicken. If you continues to vomit despite medication, return to the ED for repeat evaluation.    Department of Motor Vehicle [facility]) of [place] regulations for seizures - It is the patient's responsibility to report the incidence of the seizure in the state of [place]. [place] has no statutory provision requiring physicians to report patients diagnosed with epilepsy or seizures to a central state agency.  The recommended DMV regulation requirement for a driver in [place] for an individual with a seizure is that they be seizure-free for 6-12 months. However, the DMV may consider the following exceptions to this general rule where: (1) a physician-directed change in medication causes a seizure and the individual immediately resumes the previous therapy which controlled seizures; (2) there is a history of nocturnal seizures or seizures which do not involve loss of consciousness, loss of control of motor function, or loss of appropriate sensation and information process; and (3) an individual has a seizure disorder preceded by an aura (warning) lasting 2-3 minutes. While the [facility] may also give consideration to other unusual circumstances which may affect the general requirement that drivers be seizure-free for 6-12 months, interpretation of these circumstances and assignment of restrictions is at the discretion of the  Medical Advisor. The DMV also considers compliance with medical therapy essential for safe driving. [facility] [place] Physician's Guide to [employer] (June, 1995 ed.)] The Department learns of an individual's condition by inquiring on the application form or renewal form, a physician's report to the [facility], an accident report or from correspondence from the individual. The person may be required to submit a Medical Report Form either annually or semi-annually.  Do not drive, swim, take baths or do any other activities that would be dangerous if you had another seizure.   Contact a health care provider if: You have another seizure. You have seizures more often. Your seizure symptoms change. You continue to have seizures with treatment. You have symptoms of an infection or illness. They might increase your risk of having a seizure. Get help right away if: You have a seizure: That lasts longer than 5 minutes. That is different than previous seizures. That leaves you unable to speak or use a part of your body. That makes it harder to breathe. After a head injury. You have: Multiple seizures in a row. Confusion or a severe headache right after a seizure. You are having seizures more often. You do not wake up immediately after a seizure. You injure yourself during a seizure.

## 2021-09-01 NOTE — ED Provider Notes (Signed)
[name] EMERGENCY DEPARTMENT Provider Note   CSN: [id number] Arrival date & time: 09/01/21  0920     History Chief Complaint  Patient presents with   Hyperglycemia   Seizures    James Chang is a 55 y.o. male with a past medical history of diabetes with poor med compliance, polysubstance abuse, hyperlipidemia, hypertension, diabetic gastroparesis, schizoaffective disorder who presents to the emergency department with abdominal pain nausea and vomiting.  Patient reports 4 days of vomiting.  He states that his abdomen hurts due to multiple episodes of vomiting.  He has been unable to hold down any foods or fluids.  This is similar to previous episodes of gastroparesis.  Patient arrived via EMS.  EMS reports that during the ride he had a single witnessed seizure.  Patient reports he has never had a seizure before.  He denies any significant headache.  He denies diarrhea.  He received 5 of Versed prior to arrival.   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   Substance induced mood disorder (HCC) 07/19/2020   Intractable vomiting with nausea 12/26/2019   Vitamin D deficiency 12/23/2019   Diabetic gastroparesis (HCC) 10/28/2019   Other constipation 10/28/2019   Proliferative diabetic retinopathy of left eye with macular edema associated with type 2 diabetes mellitus (HCC) 05/19/2019   Bipolar affective disorder in remission (HCC) 03/30/2019   Diabetic retinopathy of right eye with macular edema associated with type 2 diabetes mellitus (HCC) 03/30/2019   Fatigue 03/30/2019   Snoring 03/30/2019   Uncontrolled type 2 diabetes mellitus with  hyperglycemia (HCC) 03/30/2019   Cocaine use disorder, severe, dependence (HCC) 01/14/2019   Bursitis of right foot 09/29/2018   Metatarsalgia of right foot 09/29/2018   Biceps tendonitis on right 12/11/2017   Tendinitis of right rotator cuff 11/26/2017   Noncompliance with diabetes treatment 12/26/2016   Bulging of lumbar intervertebral disc 12/06/2016   Hammer toe of left foot 03/12/2016   Current use of insulin (HCC) 01/01/2016   Osteoarthritis of spine with radiculopathy, lumbar region 11/15/2014   Chronic pain syndrome 08/18/2014   Diabetes, polyneuropathy (HCC) 08/18/2014   Esophageal reflux 12/16/2013   Schizoaffective disorder (HCC) 04/17/2013   Peripheral neuropathy 11/24/2012   Type 2 diabetes, uncontrolled, with retinopathy 09/30/2012   Hyperlipidemia 09/30/2012   Chronic low back pain 09/30/2012   Low testosterone 09/30/2012   Depression 09/30/2012   History of stroke 09/30/2012   HTN (hypertension) 09/30/2012    Past Surgical History:  Procedure Laterality Date   NO PAST SURGERIES         Family History  Problem Relation Age of Onset   Arthritis Mother    Stroke Father    Hypertension Father    Kidney disease Father    Heart attack Father    Cancer Neg Hx    Diabetes Neg Hx     Social History   Tobacco Use   Smoking status: Never   Smokeless tobacco: Never  Vaping Use   Vaping Use: Never used  Substance Use Topics   Alcohol use: Yes    Comment: seldom 1-2 times a month  beer   Drug use: Yes    Types: Cocaine    Comment: "a couple days ago"    Home Medications Prior to Admission medications   Medication Sig Start Date End Date Taking? Authorizing Provider  FLUoxetine (PROZAC) 10 MG capsule Take 10 mg by mouth daily. 06/29/21   [provider]  insulin glargine (LANTUS SOLOSTAR) 100 UNIT/ML Solostar Pen Inject 40 Units into the skin at bedtime. 04/24/18   [provider]  insulin lispro (HUMALOG) 100 UNIT/ML injection Inject 5  Units into the skin 3 (three) times daily with meals. 08/06/21   [provider]  lisinopril (PRINIVIL,ZESTRIL) 10 MG tablet Take 10 mg by mouth daily.    [provider]  losartan (COZAAR) 50 MG tablet Take 50 mg by mouth daily. 05/23/21   [provider]  ondansetron (ZOFRAN ODT) 8 MG disintegrating tablet Take 1 tablet (8 mg total) by mouth every 8 (eight) hours as needed for nausea or vomiting. 08/31/21   Margarita Grizzle, MD  pantoprazole (PROTONIX) 40 MG tablet Take 40 mg by mouth daily. 08/08/20   [provider]  QUEtiapine (SEROQUEL) 25 MG tablet Take 75 mg by mouth at bedtime as needed. 08/06/21   [provider]    Allergies    Metformin, Metformin and related, and Reglan [metoclopramide]  Review of Systems   Review of Systems  Neurological:  Positive for seizures.  Ten systems reviewed and are negative for acute change, except as noted in the HPI.   Physical Exam Updated Vital Signs BP (!) 153/90   Pulse 85   Temp 98 F (36.7 C) (Oral)   Resp 12   Ht 5\' 6"  (1.676 m)   Wt 78.9 kg   SpO2 99%   BMI 28.08 kg/m   Physical Exam Vitals and nursing note reviewed.  Constitutional:      General: He is not in acute distress.    Appearance: He is well-developed. He is not diaphoretic.  HENT:     Head: Normocephalic and atraumatic.  Eyes:     General: No scleral icterus.    Conjunctiva/sclera: Conjunctivae normal.  Cardiovascular:     Rate and Rhythm: Normal rate and regular rhythm.     Heart sounds: Normal heart sounds.  Pulmonary:     Effort: Pulmonary effort is normal. No respiratory distress.     Breath sounds: Normal breath sounds.  Abdominal:     General: There is no distension.     Palpations: Abdomen is soft.     Tenderness: There is no abdominal tenderness. There is no guarding.  Musculoskeletal:     Cervical back: Normal range of motion and neck supple.  Skin:    General: Skin is warm and dry.  Neurological:      General: No focal deficit present.     Mental Status: He is alert and oriented to person, place, and time.     Cranial Nerves: No cranial nerve deficit.     Sensory: No sensory deficit.     Motor: No weakness.  Psychiatric:        Behavior: Behavior normal.    ED Results / Procedures / Treatments   Labs (all labs ordered are listed, but only abnormal results are displayed) Labs Reviewed  CBC - Abnormal; Notable for the following components:      Result Value   WBC 11.3 (*)    Hemoglobin 12.3 (*)    HCT 35.3 (*)    All other components within  normal limits  CBG MONITORING, ED - Abnormal; Notable for the following components:   Glucose-Capillary 357 (*)    All other components within normal limits  URINALYSIS, ROUTINE W REFLEX MICROSCOPIC  COMPREHENSIVE METABOLIC PANEL  MAGNESIUM  ETHANOL  RAPID URINE DRUG SCREEN, HOSP PERFORMED  CBG MONITORING, ED  CBG MONITORING, ED    EKG None  Radiology DG Chest Port 1 View  Result Date: 08/31/2021 CLINICAL DATA:  54 year old male with abdominal pain and vomiting. EXAM: PORTABLE CHEST 1 VIEW COMPARISON:  CT Abdomen and Pelvis yesterday. FINDINGS: Portable AP semi upright view at 0842 hours. Low lung volumes. Normal cardiac size and mediastinal contours. Visualized tracheal air column is within normal limits. Allowing for portable technique the lungs are clear. No pneumothorax or pleural effusion. No acute osseous abnormality identified. IMPRESSION: Negative portable chest. Electronically Signed   By: Odessa Fleming M.D.   On: 08/31/2021 08:49    Procedures Procedures   Medications Ordered in ED Medications  sodium chloride 0.9 % bolus 1,000 mL (1,000 mLs Intravenous New Bag/Given (Non-Interop) 09/01/21 1025)    And  0.9 %  sodium chloride infusion ( Intravenous New Bag/Given (Non-Interop) 09/01/21 1031)  haloperidol lactate (HALDOL) injection 2.5 mg (has no administration in time range)  ondansetron (ZOFRAN) injection 4 mg (4 mg  Intravenous Given 09/01/21 1029)    ED Course  I have reviewed the triage vital signs and the nursing notes.  Pertinent labs & imaging results that were available during my care of the patient were reviewed by me and considered in my medical decision making (see chart for details).  Clinical Course as of 09/01/21 1518  Sat Sep 01, 2021  1357 Patient caught actively sticking his fingers down his throat to make himself vomit.  He states "it is the only way I can get relief."  He does not appear to be organically vomiting. [AH]    Clinical Course User Index [AH] Arthor Captain, PA-C   MDM Rules/Calculators/A&P                         Patient here with complaint of vomiting.  He has multiple ED visits at various locations for the same. The emergent differential diagnosis for vomiting includes, but is not limited to ACS/MI, Boerhaave's, DKA, Intracranial Hemorrhage, Ischemic bowel, Meningitis, Sepsis, , Acute gastric dilation, Acetaminophen toxicity, Adrenal insufficiency, Appendicitis, Aspirin toxicity, Bowel obstruction/ileus, Carbon monoxide poisoning, Cholecystitis, CNS tumor. Digoxin toxicity, Electrolyte abnormalities, Elevated ICP, Gastric outlet obstruction, Hyperemesis gravidarum, Pancreatitis, Peritonitis, Ruptured viscus, , Biliary colic, Cannabinoid hyperemesis syndrome, Chemotherapy, Disulfiram effect, Erythromycin, ETOH, Gastritis, Gastroenteritis, Gastroparesis, Hepatitis, Ibuprofen, Labyrinthitis, Migraine, Motion sickness, Narcotic withdrawal, Thyroid, Peptic ulcer disease, Renal colic, and UTI Patient seen actively gagging himself and retching however he has not been actively vomiting without intentionally making himself throw up.  He appears to have some nausea which has been treated with droperidol.  Patient also here with seizure earlier today.  He has no evidence of withdrawal  Work-up includes CBC which shows no significant abnormality just mildly elevated white blood cell  count, CMP with blood sugar of 381, mildly elevated creatinine just above baseline's.  Patient given fluids.  He has some improvement in his blood sugars.  CT head is without significant abnormality, EKG shows no QT prolongation.  Patient patient has not been taking his antihypertensive medications.  His blood pressure is much somewhat elevated.  At discharge the patient was actively hyperventilating according to the nurse this is  likely the reason that his respiratory rate was elevated.  Either way I feel that he is safe for discharge at this time given the frequency of use of the ED.  He appears to be not actively vomiting and less he is inducing it himself.   Final Clinical Impression(s) / ED Diagnoses Final diagnoses:  None    Rx / DC Orders ED Discharge Orders     None        Arthor Captain, PA-C 09/01/21 1526    Margarita Grizzle, MD 09/02/21 1540

## 2021-09-01 NOTE — ED Notes (Signed)
This RN at bedside to administer medications per order. Pt actively intentionally hyperventilating, this RN educated pt on breathing at a regular rate and rhythm.

## 2021-09-01 NOTE — ED Triage Notes (Signed)
Pt BIB [facility] from hotel room d/t pt calling for abd pain. EMS reports pt A.Ox4 upon their arrival & while en route to ED his CBG was noted to be 452 & 98% on RA & he had a witnessed seizure. 5mg  Ativan given. Pulse was then 80 bpm O2 went down to 90% so 3L O2 via n/c applied, 180/100 & 240 Ns bolus given. CBG was re-checked to be 370 upon arrival to ED.

## 2021-09-05 ENCOUNTER — Encounter (HOSPITAL_COMMUNITY): Payer: Self-pay | Admitting: Emergency Medicine

## 2021-09-05 ENCOUNTER — Inpatient Hospital Stay (HOSPITAL_COMMUNITY): Payer: Medicare Other

## 2021-09-05 ENCOUNTER — Inpatient Hospital Stay (HOSPITAL_COMMUNITY)
Admission: EM | Admit: 2021-09-05 | Discharge: 2021-09-09 | DRG: 683 | Disposition: A | Discharge status: Home / Self Care | Payer: Medicare Other | Attending: Internal Medicine | Admitting: Internal Medicine

## 2021-09-05 DIAGNOSIS — Z888 Allergy status to other drugs, medicaments and biological substances status: Secondary | ICD-10-CM | POA: Diagnosis not present

## 2021-09-05 DIAGNOSIS — Z8673 Personal history of transient ischemic attack (TIA), and cerebral infarction without residual deficits: Secondary | ICD-10-CM | POA: Diagnosis not present

## 2021-09-05 DIAGNOSIS — Z23 Encounter for immunization: Secondary | ICD-10-CM

## 2021-09-05 DIAGNOSIS — E113512 Type 2 diabetes mellitus with proliferative diabetic retinopathy with macular edema, left eye: Secondary | ICD-10-CM | POA: Diagnosis present

## 2021-09-05 DIAGNOSIS — Z8249 Family history of ischemic heart disease and other diseases of the circulatory system: Secondary | ICD-10-CM

## 2021-09-05 DIAGNOSIS — E1142 Type 2 diabetes mellitus with diabetic polyneuropathy: Secondary | ICD-10-CM | POA: Diagnosis present

## 2021-09-05 DIAGNOSIS — Z823 Family history of stroke: Secondary | ICD-10-CM

## 2021-09-05 DIAGNOSIS — E785 Hyperlipidemia, unspecified: Secondary | ICD-10-CM | POA: Diagnosis present

## 2021-09-05 DIAGNOSIS — Z841 Family history of disorders of kidney and ureter: Secondary | ICD-10-CM | POA: Diagnosis not present

## 2021-09-05 DIAGNOSIS — F32A Depression, unspecified: Secondary | ICD-10-CM | POA: Diagnosis present

## 2021-09-05 DIAGNOSIS — E1122 Type 2 diabetes mellitus with diabetic chronic kidney disease: Secondary | ICD-10-CM | POA: Diagnosis present

## 2021-09-05 DIAGNOSIS — Z20822 Contact with and (suspected) exposure to covid-19: Secondary | ICD-10-CM | POA: Diagnosis present

## 2021-09-05 DIAGNOSIS — I10 Essential (primary) hypertension: Secondary | ICD-10-CM | POA: Diagnosis present

## 2021-09-05 DIAGNOSIS — Z79899 Other long term (current) drug therapy: Secondary | ICD-10-CM

## 2021-09-05 DIAGNOSIS — I129 Hypertensive chronic kidney disease with stage 1 through stage 4 chronic kidney disease, or unspecified chronic kidney disease: Secondary | ICD-10-CM | POA: Diagnosis present

## 2021-09-05 DIAGNOSIS — Z66 Do not resuscitate: Secondary | ICD-10-CM | POA: Diagnosis present

## 2021-09-05 DIAGNOSIS — F1410 Cocaine abuse, uncomplicated: Secondary | ICD-10-CM | POA: Diagnosis present

## 2021-09-05 DIAGNOSIS — D72829 Elevated white blood cell count, unspecified: Secondary | ICD-10-CM | POA: Diagnosis present

## 2021-09-05 DIAGNOSIS — E871 Hypo-osmolality and hyponatremia: Secondary | ICD-10-CM | POA: Diagnosis present

## 2021-09-05 DIAGNOSIS — Z8261 Family history of arthritis: Secondary | ICD-10-CM | POA: Diagnosis not present

## 2021-09-05 DIAGNOSIS — R55 Syncope and collapse: Secondary | ICD-10-CM | POA: Diagnosis present

## 2021-09-05 DIAGNOSIS — N1832 Chronic kidney disease, stage 3b: Secondary | ICD-10-CM | POA: Diagnosis present

## 2021-09-05 DIAGNOSIS — I959 Hypotension, unspecified: Secondary | ICD-10-CM | POA: Diagnosis present

## 2021-09-05 DIAGNOSIS — Z794 Long term (current) use of insulin: Secondary | ICD-10-CM

## 2021-09-05 DIAGNOSIS — E860 Dehydration: Secondary | ICD-10-CM | POA: Diagnosis present

## 2021-09-05 DIAGNOSIS — G4700 Insomnia, unspecified: Secondary | ICD-10-CM | POA: Diagnosis present

## 2021-09-05 DIAGNOSIS — N179 Acute kidney failure, unspecified: Secondary | ICD-10-CM | POA: Diagnosis not present

## 2021-09-05 DIAGNOSIS — R112 Nausea with vomiting, unspecified: Secondary | ICD-10-CM | POA: Diagnosis present

## 2021-09-05 DIAGNOSIS — G629 Polyneuropathy, unspecified: Secondary | ICD-10-CM

## 2021-09-05 DIAGNOSIS — Z5900 Homelessness unspecified: Secondary | ICD-10-CM

## 2021-09-05 DIAGNOSIS — E1165 Type 2 diabetes mellitus with hyperglycemia: Secondary | ICD-10-CM

## 2021-09-05 DIAGNOSIS — E1169 Type 2 diabetes mellitus with other specified complication: Secondary | ICD-10-CM

## 2021-09-05 DIAGNOSIS — E11311 Type 2 diabetes mellitus with unspecified diabetic retinopathy with macular edema: Secondary | ICD-10-CM | POA: Diagnosis present

## 2021-09-05 LAB — COMPREHENSIVE METABOLIC PANEL
ALT: 12 U/L (ref 0–44)
AST: 10 U/L — ABNORMAL LOW (ref 15–41)
Albumin: 3.8 g/dL (ref 3.5–5.0)
Alkaline Phosphatase: 63 U/L (ref 38–126)
Anion gap: 10 (ref 5–15)
BUN: 50 mg/dL — ABNORMAL HIGH (ref 6–20)
CO2: 25 mmol/L (ref 22–32)
Calcium: 9.3 mg/dL (ref 8.9–10.3)
Chloride: 93 mmol/L — ABNORMAL LOW (ref 98–111)
Creatinine, Ser: 3.08 mg/dL — ABNORMAL HIGH (ref 0.61–1.24)
GFR, Estimated: 23 mL/min — ABNORMAL LOW (ref >60)
Glucose, Bld: 356 mg/dL — ABNORMAL HIGH (ref 70–99)
Potassium: 4.3 mmol/L (ref 3.5–5.1)
Sodium: 128 mmol/L — ABNORMAL LOW (ref 135–145)
Total Bilirubin: 1.1 mg/dL (ref 0.3–1.2)
Total Protein: 7.3 g/dL (ref 6.5–8.1)

## 2021-09-05 LAB — CBC WITH DIFFERENTIAL/PLATELET
Abs Immature Granulocytes: 0.07 10*3/uL (ref 0.00–0.07)
Basophils Absolute: 0 10*3/uL (ref 0.0–0.1)
Basophils Relative: 0 %
Eosinophils Absolute: 0.1 10*3/uL (ref 0.0–0.5)
Eosinophils Relative: 1 %
HCT: 36.7 % — ABNORMAL LOW (ref 39.0–52.0)
Hemoglobin: 12.8 g/dL — ABNORMAL LOW (ref 13.0–17.0)
Immature Granulocytes: 1 %
Lymphocytes Relative: 17 %
Lymphs Abs: 2 10*3/uL (ref 0.7–4.0)
MCH: 28.3 pg (ref 26.0–34.0)
MCHC: 34.9 g/dL (ref 30.0–36.0)
MCV: 81.2 fL (ref 80.0–100.0)
Monocytes Absolute: 0.7 10*3/uL (ref 0.1–1.0)
Monocytes Relative: 6 %
Neutro Abs: 8.7 10*3/uL — ABNORMAL HIGH (ref 1.7–7.7)
Neutrophils Relative %: 75 %
Platelets: 325 10*3/uL (ref 150–400)
RBC: 4.52 MIL/uL (ref 4.22–5.81)
RDW: 12.3 % (ref 11.5–15.5)
WBC: 11.6 10*3/uL — ABNORMAL HIGH (ref 4.0–10.5)
nRBC: 0 % (ref 0.0–0.2)

## 2021-09-05 LAB — RESP PANEL BY RT-PCR (FLU A&B, COVID) ARPGX2
Influenza A by PCR: NEGATIVE
Influenza B by PCR: NEGATIVE
SARS Coronavirus 2 by RT PCR: NEGATIVE

## 2021-09-05 LAB — PHOSPHORUS: Phosphorus: 4.1 mg/dL (ref 2.5–4.6)

## 2021-09-05 LAB — CBG MONITORING, ED: Glucose-Capillary: 352 mg/dL — ABNORMAL HIGH (ref 70–99)

## 2021-09-05 LAB — LIPASE, BLOOD: Lipase: 26 U/L (ref 11–51)

## 2021-09-05 MED ORDER — ACETAMINOPHEN 650 MG RE SUPP: 650.0000 mg | Freq: Four times a day (QID) | RECTAL | Status: DC | PRN

## 2021-09-05 MED ORDER — PANTOPRAZOLE SODIUM 40 MG IV SOLR
40.0000 mg | INTRAVENOUS | Status: DC
  Administered 2021-09-05: 40 mg via INTRAVENOUS
  Filled 2021-09-05: qty 40

## 2021-09-05 MED ORDER — ACETAMINOPHEN 325 MG PO TABS
650.0000 mg | ORAL_TABLET | Freq: Four times a day (QID) | ORAL | Status: DC | PRN
  Administered 2021-09-06 – 2021-09-08 (×3): 650 mg via ORAL
  Filled 2021-09-05 (×3): qty 2

## 2021-09-05 MED ORDER — QUETIAPINE FUMARATE 25 MG PO TABS: 75.0000 mg | ORAL_TABLET | Freq: Every evening | ORAL | Status: DC | PRN

## 2021-09-05 MED ORDER — SODIUM CHLORIDE 0.9% FLUSH
3.0000 mL | Freq: Two times a day (BID) | INTRAVENOUS | Status: DC
  Administered 2021-09-05 – 2021-09-08 (×6): 3 mL via INTRAVENOUS

## 2021-09-05 MED ORDER — SODIUM CHLORIDE 0.9 % IV SOLN: INTRAVENOUS | Status: DC

## 2021-09-05 MED ORDER — INFLUENZA VAC SPLIT QUAD 0.5 ML IM SUSY
0.5000 mL | PREFILLED_SYRINGE | INTRAMUSCULAR | Status: AC
  Administered 2021-09-06: 0.5 mL via INTRAMUSCULAR
  Filled 2021-09-05: qty 0.5

## 2021-09-05 MED ORDER — INSULIN GLARGINE 100 UNIT/ML SOLOSTAR PEN: 30.0000 [IU] | PEN_INJECTOR | Freq: Every day | SUBCUTANEOUS | Status: DC

## 2021-09-05 MED ORDER — ONDANSETRON HCL 4 MG PO TABS
4.0000 mg | ORAL_TABLET | Freq: Four times a day (QID) | ORAL | Status: DC | PRN
  Administered 2021-09-08: 4 mg via ORAL
  Filled 2021-09-05: qty 1

## 2021-09-05 MED ORDER — HEPARIN SODIUM (PORCINE) 5000 UNIT/ML IJ SOLN
5000.0000 [IU] | Freq: Three times a day (TID) | INTRAMUSCULAR | Status: DC
  Administered 2021-09-05 – 2021-09-09 (×10): 5000 [IU] via SUBCUTANEOUS
  Filled 2021-09-05 (×10): qty 1

## 2021-09-05 MED ORDER — INSULIN GLARGINE-YFGN 100 UNIT/ML ~~LOC~~ SOLN
30.0000 [IU] | Freq: Every day | SUBCUTANEOUS | Status: DC
  Administered 2021-09-05: 30 [IU] via SUBCUTANEOUS
  Filled 2021-09-05 (×4): qty 0.3

## 2021-09-05 MED ORDER — LACTATED RINGERS IV BOLUS
1000.0000 mL | Freq: Once | INTRAVENOUS | Status: AC
  Administered 2021-09-05: 1000 mL via INTRAVENOUS

## 2021-09-05 MED ORDER — FLUOXETINE HCL 10 MG PO CAPS
10.0000 mg | ORAL_CAPSULE | Freq: Every day | ORAL | Status: DC
  Administered 2021-09-06 – 2021-09-09 (×4): 10 mg via ORAL
  Filled 2021-09-05 (×4): qty 1

## 2021-09-05 MED ORDER — ONDANSETRON HCL 4 MG/2ML IJ SOLN
4.0000 mg | Freq: Four times a day (QID) | INTRAMUSCULAR | Status: DC | PRN
  Administered 2021-09-07: 4 mg via INTRAVENOUS
  Filled 2021-09-05: qty 2

## 2021-09-05 NOTE — ED Provider Notes (Signed)
[facility]-EMERGENCY DEPT Provider Note   CSN: [id number] Arrival date & time: 09/05/21  1351     History Chief Complaint  Patient presents with   Hypotension   Loss of Consciousness    James Chang is a 55 y.o. male who presents to the emergency department for evaluation of hypotension and syncope.  Patient states that he went to an urgent care today and the next thing he remembers he "woke up on the floor.  Patient had a witnessed episode of syncope today and EMS found the patient to be hypotensive in the 80s.  Then transferred to the emergency department for further evaluation.  Patient has been seen in the emergency department multiple times over the last week for gastroparesis and what appears to be self-induced vomiting.  He does endorse that he has not had a meal for over a week.  He endorses a history of crack cocaine use but has not used it in over a week and a half.  On my evaluation, he states that he is only having mild epigastric abdominal pain, denies chest pain, shortness of breath, diarrhea, cough, fever or other systemic symptoms.   Loss of Consciousness Associated symptoms: nausea and vomiting   Associated symptoms: no chest pain, no fever, no palpitations, no seizures and no shortness of breath       Past Medical History:  Diagnosis Date   Depression    Diabetes mellitus without complication (HCC)    History of migraine    Hyperlipidemia    Hypertension    Personal history of drug abuse (HCC)    Stroke (HCC) 2008    Patient Active Problem List   Diagnosis Date Noted   AKI (acute kidney injury) (HCC) 09/05/2021   Psychoactive substance-induced psychosis (HCC) 07/19/2020   Substance induced mood disorder (HCC) 07/19/2020   Intractable vomiting with nausea 12/26/2019   Vitamin D deficiency 12/23/2019   Diabetic gastroparesis (HCC) 10/28/2019   Other constipation 10/28/2019   Proliferative diabetic retinopathy of left eye with  macular edema associated with type 2 diabetes mellitus (HCC) 05/19/2019   Bipolar affective disorder in remission (HCC) 03/30/2019   Diabetic retinopathy of right eye with macular edema associated with type 2 diabetes mellitus (HCC) 03/30/2019   Fatigue 03/30/2019   Snoring 03/30/2019   Uncontrolled type 2 diabetes mellitus with hyperglycemia (HCC) 03/30/2019   Cocaine use disorder, severe, dependence (HCC) 01/14/2019   Bursitis of right foot 09/29/2018   Metatarsalgia of right foot 09/29/2018   Biceps tendonitis on right 12/11/2017   Tendinitis of right rotator cuff 11/26/2017   Noncompliance with diabetes treatment 12/26/2016   Bulging of lumbar intervertebral disc 12/06/2016   Hammer toe of left foot 03/12/2016   Current use of insulin (HCC) 01/01/2016   Osteoarthritis of spine with radiculopathy, lumbar region 11/15/2014   Chronic pain syndrome 08/18/2014   Diabetes, polyneuropathy (HCC) 08/18/2014   Esophageal reflux 12/16/2013   Schizoaffective disorder (HCC) 04/17/2013   Peripheral neuropathy 11/24/2012   Type 2 diabetes, uncontrolled, with retinopathy 09/30/2012   Hyperlipidemia 09/30/2012   Chronic low back pain 09/30/2012   Low testosterone 09/30/2012   Depression 09/30/2012   History of stroke 09/30/2012   HTN (hypertension) 09/30/2012    Past Surgical History:  Procedure Laterality Date   NO PAST SURGERIES         Family History  Problem Relation Age of Onset   Arthritis Mother    Stroke Father    Hypertension Father  Kidney disease Father    Heart attack Father    Cancer Neg Hx    Diabetes Neg Hx     Social History   Tobacco Use   Smoking status: Never   Smokeless tobacco: Never  Vaping Use   Vaping Use: Never used  Substance Use Topics   Alcohol use: Yes    Comment: seldom 1-2 times a month beer   Drug use: Yes    Types: Cocaine    Comment: "a couple days ago"    Home Medications Prior to Admission medications   Medication Sig Start  Date End Date Taking? Authorizing Provider  atorvastatin (LIPITOR) 40 MG tablet Take 40 mg by mouth daily.   Yes [provider]  FLUoxetine (PROZAC) 10 MG capsule Take 10 mg by mouth daily. 06/29/21  Yes [provider]  insulin glargine (LANTUS SOLOSTAR) 100 UNIT/ML Solostar Pen Inject 40 Units into the skin at bedtime. 04/24/18  Yes [provider]  insulin lispro (HUMALOG) 100 UNIT/ML injection Inject 5 Units into the skin 3 (three) times daily with meals. 08/06/21  Yes [provider]  lisinopril (PRINIVIL,ZESTRIL) 10 MG tablet Take 10 mg by mouth daily.   Yes [provider]  losartan (COZAAR) 50 MG tablet Take 50 mg by mouth daily. 05/23/21  Yes [provider]  ondansetron (ZOFRAN ODT) 8 MG disintegrating tablet Take 1 tablet (8 mg total) by mouth every 8 (eight) hours as needed for nausea or vomiting. 08/31/21  Yes Margarita Grizzle, MD  pantoprazole (PROTONIX) 40 MG tablet Take 40 mg by mouth daily. 08/08/20  Yes [provider]  QUEtiapine (SEROQUEL) 25 MG tablet Take 75 mg by mouth at bedtime as needed (sleep). 08/06/21  Yes [provider]    Allergies    Metformin, Metformin and related, and Reglan [metoclopramide]  Review of Systems   Review of Systems  Constitutional:  Negative for chills and fever.  HENT:  Negative for ear pain and sore throat.   Eyes:  Negative for pain and visual disturbance.  Respiratory:  Negative for cough and shortness of breath.   Cardiovascular:  Positive for syncope. Negative for chest pain and palpitations.  Gastrointestinal:  Positive for nausea and vomiting. Negative for abdominal pain.  Genitourinary:  Negative for dysuria and hematuria.  Musculoskeletal:  Negative for arthralgias and back pain.  Skin:  Negative for color change and rash.  Neurological:  Positive for syncope. Negative for seizures.  All other systems reviewed and are negative.  Physical Exam Updated Vital  Signs BP (!) 122/92   Pulse 92   Temp 99 F (37.2 C) (Oral)   Resp (!) 21   SpO2 100%   Physical Exam Vitals and nursing note reviewed.  Constitutional:      Appearance: He is well-developed.  HENT:     Head: Normocephalic and atraumatic.  Eyes:     Conjunctiva/sclera: Conjunctivae normal.  Cardiovascular:     Rate and Rhythm: Normal rate and regular rhythm.     Heart sounds: No murmur heard. Pulmonary:     Effort: Pulmonary effort is normal. No respiratory distress.     Breath sounds: Normal breath sounds.  Abdominal:     Palpations: Abdomen is soft.     Tenderness: There is abdominal tenderness (epigastric).  Musculoskeletal:     Cervical back: Neck supple.  Skin:    General: Skin is warm and dry.  Neurological:     Mental Status: He is alert.    ED  Results / Procedures / Treatments   Labs (all labs ordered are listed, but only abnormal results are displayed) Labs Reviewed  CBC WITH DIFFERENTIAL/PLATELET - Abnormal; Notable for the following components:      Result Value   WBC 11.6 (*)    Hemoglobin 12.8 (*)    HCT 36.7 (*)    Neutro Abs 8.7 (*)    All other components within normal limits  COMPREHENSIVE METABOLIC PANEL - Abnormal; Notable for the following components:   Sodium 128 (*)    Chloride 93 (*)    Glucose, Bld 356 (*)    BUN 50 (*)    Creatinine, Ser 3.08 (*)    AST 10 (*)    GFR, Estimated 23 (*)    All other components within normal limits  CBG MONITORING, ED - Abnormal; Notable for the following components:   Glucose-Capillary 352 (*)    All other components within normal limits  RESP PANEL BY RT-PCR (FLU A&B, COVID) ARPGX2  LIPASE, BLOOD  PHOSPHORUS  URINALYSIS, ROUTINE W REFLEX MICROSCOPIC  HIV ANTIBODY (ROUTINE TESTING W REFLEX)  BASIC METABOLIC PANEL  CBC  CBC  CREATININE, SERUM    EKG None  Radiology No results found.  Procedures Procedures   Medications Ordered in ED Medications  influenza vac split quadrivalent PF  (FLUARIX) injection 0.5 mL (has no administration in time range)  heparin injection 5,000 Units (has no administration in time range)  sodium chloride flush (NS) 0.9 % injection 3 mL (has no administration in time range)  acetaminophen (TYLENOL) tablet 650 mg (has no administration in time range)    Or  acetaminophen (TYLENOL) suppository 650 mg (has no administration in time range)  0.9 %  sodium chloride infusion (has no administration in time range)  ondansetron (ZOFRAN) tablet 4 mg (has no administration in time range)    Or  ondansetron (ZOFRAN) injection 4 mg (has no administration in time range)  lactated ringers bolus 1,000 mL (1,000 mLs Intravenous New Bag/Given 09/05/21 1620)    ED Course  I have reviewed the triage vital signs and the nursing notes.  Pertinent labs & imaging results that were available during my care of the patient were reviewed by me and considered in my medical decision making (see chart for details).    MDM Rules/Calculators/A&P                           Patient seen the emergency department for evaluation of syncope, hypotension and nausea and vomiting.  Physical exam unremarkable outside of mild epigastric abdominal tenderness.  Laboratory evaluation with a leukocytosis to 11.6, hyponatremia 128, significant creatinine elevation to 3.08 and a BUN of 50.  Patient also has a glucose of 356 but a normal bicarb and I do not with the patient is in DKA.  Patient started on fluid resuscitation and will require admission for what is likely a prerenal AKI. Final Clinical Impression(s) / ED Diagnoses Final diagnoses:  AKI (acute kidney injury) [facility])    Rx / DC Orders ED Discharge Orders     None        Aylana Hirschfeld, Wyn Forster, MD 09/05/21 1720

## 2021-09-05 NOTE — H&P (Signed)
History and Physical    James Chang  MWU:[id number]  DOB: October 11, 1966  DOA: 09/05/2021 PCP: Pcp, No   Patient coming from: Motel  Chief Complaint: Vomiting and weakness  HPI: James Chang is a 55 y.o. male with medical history of diabetes mellitus on insulin, hypertension, hyperlipidemia, cocaine abuse, CVA, depression and anxiety.  The patient has been frequenting the ED for this past week with complaints of nausea vomiting and abdominal pain.  Today he was going to an appointment at his PCPs office and became unconscious.  He was noted to be hypotensive with a blood pressure of 82/64 when the EMS arrived and a fluid bolus was given.  His blood glucose was noted to be 466. In the ED, he continued to complain of feeling lightheaded nauseated and generally unwell.    ED Course: Blood work revealed a creatinine of dyscrasias which has increased from his baseline of 1.3 Sodium noted to be 128, chloride 93, glucose 356.  Review of Systems:  All other systems reviewed and apart from HPI, are negative.  Past Medical History:  Diagnosis Date   Depression    Diabetes mellitus without complication (HCC)    History of migraine    Hyperlipidemia    Hypertension    Personal history of drug abuse (HCC)    Stroke (HCC) 2008    Past Surgical History:  Procedure Laterality Date   NO PAST SURGERIES      Social History:   reports that he has never smoked. He has never used smokeless tobacco.  He does not admit to any alcohol use.  He reports current drug use. Drug: Cocaine.  He states he last used cocaine about a week and a half ago and was using it almost daily prior to stopping.  He does not admit to any other drug use.    Allergies  Allergen Reactions   Metformin Diarrhea   Metformin And Related    Reglan [Metoclopramide]     Family History  Problem Relation Age of Onset   Arthritis Mother    Stroke Father    Hypertension Father    Kidney disease Father    Heart attack  Father    Cancer Neg Hx    Diabetes Neg Hx      Prior to Admission medications   Medication Sig Start Date End Date Taking? Authorizing Provider  atorvastatin (LIPITOR) 40 MG tablet Take 40 mg by mouth daily.   Yes [provider]  FLUoxetine (PROZAC) 10 MG capsule Take 10 mg by mouth daily. 06/29/21  Yes [provider]  insulin glargine (LANTUS SOLOSTAR) 100 UNIT/ML Solostar Pen Inject 40 Units into the skin at bedtime. 04/24/18  Yes [provider]  insulin lispro (HUMALOG) 100 UNIT/ML injection Inject 5 Units into the skin 3 (three) times daily with meals. 08/06/21  Yes [provider]  lisinopril (PRINIVIL,ZESTRIL) 10 MG tablet Take 10 mg by mouth daily.   Yes [provider]  losartan (COZAAR) 50 MG tablet Take 50 mg by mouth daily. 05/23/21  Yes [provider]  ondansetron (ZOFRAN ODT) 8 MG disintegrating tablet Take 1 tablet (8 mg total) by mouth every 8 (eight) hours as needed for nausea or vomiting. 08/31/21  Yes Margarita Grizzle, MD  pantoprazole (PROTONIX) 40 MG tablet Take 40 mg by mouth daily. 08/08/20  Yes [provider]  QUEtiapine (SEROQUEL) 25 MG tablet Take 75 mg by mouth at bedtime as needed (sleep). 08/06/21  Yes [provider]  Physical Exam: Wt Readings from Last 3 Encounters:  09/01/21 78.9 kg  08/31/21 86.2 kg  08/30/21 83.5 kg   Vitals:   09/05/21 1403 09/05/21 1559 09/05/21 1630 09/05/21 1700  BP: (!) 96/58 110/75 120/72 (!) 122/92  Pulse: 91 88 81 92  Resp: 16 17 18  (!) 21  Temp: 99 F (37.2 C)     TempSrc: Oral     SpO2: 99% 96% 100% 100%      Constitutional:  Calm & comfortable Eyes: PERRLA, lids and conjunctivae normal ENT:  Mucous membranes are moist.  Pharynx clear of exudate   Normal dentition.  Neck: Supple, no masses  Respiratory:  Clear to auscultation bilaterally  Normal respiratory effort.  Cardiovascular:  S1 & S2 heard, regular rate and rhythm No  Murmurs Abdomen:  Non distended Tenderness present in the epigastrium and periumbilical area No masses Bowel sounds normal Extremities:  No clubbing / cyanosis No pedal edema No joint deformity    Skin:  No rashes, lesions or ulcers Neurologic:  AAO x 3 CN 2-12 grossly intact Sensation intact Strength 5/5 in all 4 extremities Psychiatric:  Normal Mood and affect    Labs on Admission: I have personally reviewed following labs and imaging studies  CBC: Recent Labs  Lab 08/30/21 0726 08/31/21 0850 09/01/21 0948 09/05/21 1434  WBC 9.7 8.5 11.3* 11.6*  NEUTROABS  --   --   --  8.7*  HGB 11.9* 12.3* 12.3* 12.8*  HCT 34.9* 36.3* 35.3* 36.7*  MCV 83.3 83.6 82.5 81.2  PLT 291 326 316 325   Basic Metabolic Panel: Recent Labs  Lab 08/30/21 0726 08/31/21 0850 09/01/21 1000 09/05/21 1434  NA 134* 131* 132* 128*  K 4.0 5.1 4.0 4.3  CL 99 98 97* 93*  CO2 25 23 22 25   GLUCOSE 349* 330* 381* 356*  BUN 23* 21* 29* 50*  CREATININE 1.37* 1.49* 1.70* 3.08*  CALCIUM 9.3 9.7 9.6 9.3  MG  --   --  1.8  --   PHOS  --   --   --  4.1   GFR: Estimated Creatinine Clearance: 26.4 mL/min (A) (by C-G formula based on SCr of 3.08 mg/dL (H)). Liver Function Tests: Recent Labs  Lab 08/31/21 0850 09/01/21 1000 09/05/21 1434  AST 17 12* 10*  ALT 12 12 12   ALKPHOS 58 57 63  BILITOT 2.0* 1.6* 1.1  PROT 6.9 7.0 7.3  ALBUMIN 3.8 3.9 3.8   Recent Labs  Lab 08/30/21 0726 08/31/21 0850 09/05/21 1434  LIPASE 21 21 26    No results for input(s): AMMONIA in the last 168 hours. Coagulation Profile: No results for input(s): INR, PROTIME in the last 168 hours. Cardiac Enzymes: No results for input(s): CKTOTAL, CKMB, CKMBINDEX, TROPONINI in the last 168 hours. BNP (last 3 results) No results for input(s): PROBNP in the last 8760 hours. HbA1C: No results for input(s): HGBA1C in the last 72 hours. CBG: Recent Labs  Lab 08/31/21 1400 09/01/21 0946 09/01/21 1208 09/05/21 1403   GLUCAP 280* 357* 345* 352*   Lipid Profile: No results for input(s): CHOL, HDL, LDLCALC, TRIG, CHOLHDL, LDLDIRECT in the last 72 hours. Thyroid Function Tests: No results for input(s): TSH, T4TOTAL, FREET4, T3FREE, THYROIDAB in the last 72 hours. Anemia Panel: No results for input(s): VITAMINB12, FOLATE, FERRITIN, TIBC, IRON, RETICCTPCT in the last 72 hours. Urine analysis:    Component Value Date/Time   COLORURINE AMBER (A) 03/19/2020 0200   APPEARANCEUR HAZY (A) 03/19/2020 0200   LABSPEC  1.017 03/19/2020 0200   PHURINE 5.0 03/19/2020 0200   GLUCOSEU >=500 (A) 03/19/2020 0200   HGBUR LARGE (A) 03/19/2020 0200   BILIRUBINUR NEGATIVE 03/19/2020 0200   KETONESUR 5 (A) 03/19/2020 0200   PROTEINUR 100 (A) 03/19/2020 0200   NITRITE POSITIVE (A) 03/19/2020 0200   LEUKOCYTESUR MODERATE (A) 03/19/2020 0200   Sepsis Labs: @LABRCNTIP (procalcitonin:4,lacticidven:4) ) Recent Results (from the past 240 hour(s))  Resp Panel by RT-PCR (Flu A&B, Covid) Nasopharyngeal Swab     Status: None   Collection Time: 09/05/21  5:31 PM   Specimen: Nasopharyngeal Swab; Nasopharyngeal(NP) swabs in vial transport medium  Result Value Ref Range Status   SARS Coronavirus 2 by RT PCR NEGATIVE NEGATIVE Final    Comment: (NOTE) SARS-CoV-2 target nucleic acids are NOT DETECTED.  The SARS-CoV-2 RNA is generally detectable in upper respiratory specimens during the acute phase of infection. The lowest concentration of SARS-CoV-2 viral copies this assay can detect is 138 copies/mL. A negative result does not preclude SARS-Cov-2 infection and should not be used as the sole basis for treatment or other patient management decisions. A negative result may occur with  improper specimen collection/handling, submission of specimen other than nasopharyngeal swab, presence of viral mutation(s) within the areas targeted by this assay, and inadequate number of viral copies(<138 copies/mL). A negative result must be  combined with clinical observations, patient history, and epidemiological information. The expected result is Negative.  Fact Sheet for Patients:  09/07/21  Fact Sheet for Healthcare Providers:  BloggerCourse.com  This test is no t yet approved or cleared by the [employer]ious[employer].[place] FDA and  has been authorized for detection and/or diagnosis of SARS-CoV-2 by FDA under an Emergency Use Authorization (EUA). This EUA will remain  in effect (meaning this test can be used) for the duration of the COVID-19 declaration under Section 564(b)(1) of the Act, 21 U.S.C.section 360bbb-3(b)(1), unless the authorization is terminated  or revoked sooner.       Influenza A by PCR NEGATIVE NEGATIVE Final   Influenza B by PCR NEGATIVE NEGATIVE Final    Comment: (NOTE) The Xpert Xpress SARS-CoV-2/FLU/RSV plus assay is intended as an aid in the diagnosis of influenza from Nasopharyngeal swab specimens and should not be used as a sole basis for treatment. Nasal washings and aspirates are unacceptable for Xpert Xpress SARS-CoV-2/FLU/RSV testing.  Fact Sheet for Patients: [place]  Fact Sheet for Healthcare Providers: BloggerCourse.com  This test is not yet approved or cleared by the SeriousBroker.it FDA and has been authorized for detection and/or diagnosis of SARS-CoV-2 by FDA under an Emergency Use Authorization (EUA). This EUA will remain in effect (meaning this test can be used) for the duration of the COVID-19 declaration under Section 564(b)(1) of the Act, 21 U.S.C. section 360bbb-3(b)(1), unless the authorization is terminated or revoked.  Performed at [facility], [address] [place], [place]      Radiological Exams on Admission: DG Abd 2 Views  Result Date: 09/05/2021 CLINICAL DATA:  Abdomen pain vomiting and nausea EXAM: ABDOMEN - 2 VIEW  COMPARISON:  03/22/2020, CT 08/30/2021 FINDINGS: The bowel gas pattern is normal. There is no evidence of free air. No radio-opaque calculi or other significant radiographic abnormality is seen. Moderate to large quantity of stool in the colon. IMPRESSION: Negative. Electronically Signed   By: 09/01/2021 M.D.   On: 09/05/2021 18:18    EKG: Independently reviewed.  Normal sinus rhythm  Assessment/Plan Active Problems: Acute renal failure-chronic kidney disease stage  IIIb Hyponatremia -Creatinine has more than doubled over the past week - ikely prerenal due to vomiting in the setting of chronic ARB use - Hold losartan - Continue IV fluids until he is able to increase his oral intake -Follow I's and O's  Abdominal pain nausea and vomiting - A CT scan of the abdomen and pelvis with was performed on 10/20 and was unrevealing - 2 view abdominal x-ray today is also unrevealing -He is not having any diarrhea - He has not had any fevers or chills-there is no hematemesis - At this time the etiology for his vomiting is undetermined-questionable if it was viral related initially - He will be on clear liquids for now-as needed antiemetics have been ordered   Hypotension with a history of essential hypertension - He has both lisinopril and losartan listed on his med rec but in his bag, he only has a bottle of lisinopril-we will be holding this - Follow BP-he currently is  Diabetes mellitus-type II, insulin requiring - His blood sugars are elevated in the 2-300 range-I suspect his high sugars are also contributing to his dehydration - He states that he has been taking his Lantus but has not been taking his NovoLog specifically because he has not eaten in over a week - I will resume his Lantus at 30 units rather than 40 and place him on a low-dose sliding scale for now and we will follow his sugars  Cocaine abuse - Have counseled the patient to avoid cocaine in the future - The patient states  that it has been about a week and a half since he last used it  Insomnia - Continue Seroquel  Depression - Continue fluoxetine  Dyslipidemia - Hold Lipitor for now until GI symptoms improve  Homeless - He is currently staying in a motel which is being paid by [facility]   DVT prophylaxis: heparin injection 5,000 Units Start: 09/05/21 2200  Code Status: DO NOT RESUSCITATE Family Communication:   Disposition Plan:  Remains inpatient appropriate because: IV fluids needed for AKI, antiemetics needed for vomiting Consults called: None Admission status: Inpatient-I believe this patient will require greater than 2 midnight stay for the treatment of AKI and vomiting Level of care: Telemetry  Calvert Cantor MD [facility] Pager: [email or web address] Password [email or web address] 7PM-7AM, please contact night-coverage   09/05/2021, 6:38 PM

## 2021-09-05 NOTE — ED Triage Notes (Signed)
Pt presents via EMS after having syncopal episode and hypotension at urgent care. 82/64 after 400cc IV fluids. 18g LAC. CBG 466.

## 2021-09-05 NOTE — TOC Initial Note (Signed)
Transition of Care [facility]) - Initial/Assessment Note    Patient Details  Name: James Chang MRN: [id number] Date of Birth: [birth year]  [facility]) CM/SW Contact:    James Chars, LCSW Phone Number: 09/05/2021, 7:06 PM  Clinical Narrative:   CSW met with pt to complete initial assessment.  Pt reports he is homeless but is working with [facility] towards permanent housing.  Case manager is James Chang, [phone].  Pt has been staying in [employer], [address], [place] and need transportation back at discharge.  Permission also given to speak with brother James Chang. In addition to housing services, pt reports James Chang is attempting to get him connected with a mental health ACT team for additional community support, but this is not in place yet.  Pt does have PCP: [facility].  Current DME: none.  Pt reports He is vaccinated for covid with 2 boosters.                  Expected Discharge Plan:  (TBD) Barriers to Discharge: Continued Medical Work up   Patient Goals and CMS Choice Patient states their goals for this hospitalization and ongoing recovery are:: get back to hotel at [place]      Expected Discharge Plan and Services Expected Discharge Plan:  (TBD) In-house Referral: Clinical Social Work     Living arrangements for the past 2 months: Hotel/Motel                                      Prior Living Arrangements/Services Living arrangements for the past 2 months: Hotel/Motel Lives with:: Self Patient language and need for interpreter reviewed:: Yes Do you feel safe going back to the place where you live?: Yes      Need for Family Participation in Patient Care: No (Comment) Care giver support system in place?: Yes (comment) Current home services: Other (comment) (none) Criminal Activity/Legal Involvement Pertinent to Current Situation/Hospitalization: No - Comment as needed  Activities of Daily Living Home Assistive  Devices/Equipment: Eyeglasses, CBG Meter (waiting on a walker and cane from md offiice per pt) ADL Screening (condition at time of admission) Patient's cognitive ability adequate to safely complete daily activities?: Yes Is the patient deaf or have difficulty hearing?: No Does the patient have difficulty seeing, even when wearing glasses/contacts?: Yes Does the patient have difficulty concentrating, remembering, or making decisions?: No Patient able to express need for assistance with ADLs?: Yes Does the patient have difficulty dressing or bathing?: No Independently performs ADLs?: Yes (appropriate for developmental age) Does the patient have difficulty walking or climbing stairs?: Yes Weakness of Legs: Both Weakness of Arms/Hands: Both  Permission Sought/Granted Permission sought to share information with : Family Supports Permission granted to share information with : Yes, Verbal Permission Granted  Share Information with NAME: brother James Chang 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   Substance induced mood disorder ([place]) 07/19/2020  Intractable vomiting with nausea 12/26/2019   Vitamin D deficiency 12/23/2019   Diabetic gastroparesis ([place]) 10/28/2019   Other constipation 10/28/2019   Proliferative diabetic retinopathy of left eye with macular edema associated with type 2 diabetes mellitus ([place]) 05/19/2019   Bipolar affective disorder in remission  ([place]) 03/30/2019   Diabetic retinopathy of right eye with macular edema associated with type 2 diabetes mellitus ([place]) 03/30/2019   Fatigue 03/30/2019   Snoring 03/30/2019   Uncontrolled type 2 diabetes mellitus with hyperglycemia ([place]) 03/30/2019   Cocaine use disorder, severe, dependence ([place]) 01/14/2019   Bursitis of right foot 09/29/2018   Metatarsalgia of right foot 09/29/2018   Biceps tendonitis on right 12/11/2017   Tendinitis of right rotator cuff 11/26/2017   Noncompliance with diabetes treatment 12/26/2016   Bulging of lumbar intervertebral disc 12/06/2016   Hammer toe of left foot 03/12/2016   Current use of insulin ([place]) 01/01/2016   Osteoarthritis of spine with radiculopathy, lumbar region 11/15/2014   Chronic pain syndrome 08/18/2014   Diabetes, polyneuropathy ([place]) 08/18/2014   Esophageal reflux 12/16/2013   Schizoaffective disorder ([place]) 04/17/2013   Peripheral neuropathy 11/24/2012   Type 2 diabetes, uncontrolled, with retinopathy 09/30/2012   Hyperlipidemia 09/30/2012   Chronic low back pain 09/30/2012   Low testosterone 09/30/2012   Depression 09/30/2012   History of stroke 09/30/2012   HTN (hypertension) 09/30/2012   PCP:  James Chang No Pharmacy:   [facility], [place] [address] Phone: [phone] Fax: [phone]     Social Determinants of Health (SDOH) Interventions    Readmission Risk Interventions No flowsheet data found.

## 2021-09-05 NOTE — ED Notes (Signed)
Pt care taken, pt has no complaints at this time, alert and oriented attempting to get some rest

## 2021-09-05 NOTE — Progress Notes (Signed)
Pt had a blood product refusal on his chart. He states he will receive blood if he needs to get it. Blood product refusal removed from pt's chart. Briscoe Burns BSN, RN-BC Admissions RN

## 2021-09-05 NOTE — ED Provider Notes (Signed)
Emergency Medicine Provider Triage Evaluation Note  James Chang , a 55 y.o. male  was evaluated in triage.  Pt complains of lightheadedness and hypotension.  Patient was at the his doctor's office today for scheduled visit when he had a presyncopal event.  Chronically hypotensive urgent care with blood pressure in 70s to 80s.  He has had feelings of presyncope for the past week.  Also having lots of issues with nausea, vomiting, abdominal pain.  Review of Systems  Positive: N/v, abd pain, presyncope Negative: cp  Physical Exam  BP (!) 96/58 (BP Location: Left Arm)   Pulse 91   Temp 99 F (37.2 C) (Oral)   Resp 16   SpO2 99%  Gen:   Awake, no distress   Resp:  Normal effort  MSK:   Moves extremities without difficulty  Other:  Diffuse ttp of abd  Medical Decision Making  Medically screening exam initiated at 2:05 PM.  Appropriate orders placed.  Tyrell Antonio was informed that the remainder of the evaluation will be completed by another provider, this initial triage assessment does not replace that evaluation, and the importance of remaining in the ED until their evaluation is complete.  Labs, ekg   Alveria Apley, PA-C 09/05/21 1409    Glendora Score, MD 09/05/21 [phone]

## 2021-09-06 DIAGNOSIS — R112 Nausea with vomiting, unspecified: Secondary | ICD-10-CM | POA: Diagnosis not present

## 2021-09-06 DIAGNOSIS — N179 Acute kidney failure, unspecified: Secondary | ICD-10-CM | POA: Diagnosis not present

## 2021-09-06 LAB — CBC
HCT: 29.5 % — ABNORMAL LOW (ref 39.0–52.0)
Hemoglobin: 10.5 g/dL — ABNORMAL LOW (ref 13.0–17.0)
MCH: 28.8 pg (ref 26.0–34.0)
MCHC: 35.6 g/dL (ref 30.0–36.0)
MCV: 80.8 fL (ref 80.0–100.0)
Platelets: 264 10*3/uL (ref 150–400)
RBC: 3.65 MIL/uL — ABNORMAL LOW (ref 4.22–5.81)
RDW: 12.3 % (ref 11.5–15.5)
WBC: 8 10*3/uL (ref 4.0–10.5)
nRBC: 0 % (ref 0.0–0.2)

## 2021-09-06 LAB — URINALYSIS, ROUTINE W REFLEX MICROSCOPIC
Bilirubin Urine: NEGATIVE
Glucose, UA: >=500 mg/dL — AB
Hgb urine dipstick: NEGATIVE
Ketones, ur: NEGATIVE mg/dL
Leukocytes,Ua: NEGATIVE
Nitrite: NEGATIVE
Protein, ur: NEGATIVE mg/dL
Specific Gravity, Urine: 1.013 (ref 1.005–1.030)
pH: 5 (ref 5.0–8.0)

## 2021-09-06 LAB — BASIC METABOLIC PANEL
Anion gap: 8 (ref 5–15)
BUN: 45 mg/dL — ABNORMAL HIGH (ref 6–20)
CO2: 22 mmol/L (ref 22–32)
Calcium: 7.6 mg/dL — ABNORMAL LOW (ref 8.9–10.3)
Chloride: 99 mmol/L (ref 98–111)
Creatinine, Ser: 2 mg/dL — ABNORMAL HIGH (ref 0.61–1.24)
GFR, Estimated: 38 mL/min — ABNORMAL LOW (ref >60)
Glucose, Bld: 369 mg/dL — ABNORMAL HIGH (ref 70–99)
Potassium: 3.8 mmol/L (ref 3.5–5.1)
Sodium: 129 mmol/L — ABNORMAL LOW (ref 135–145)

## 2021-09-06 LAB — CBG MONITORING, ED
Glucose-Capillary: 274 mg/dL — ABNORMAL HIGH (ref 70–99)
Glucose-Capillary: 273 mg/dL — ABNORMAL HIGH (ref 70–99)
Glucose-Capillary: 111 mg/dL — ABNORMAL HIGH (ref 70–99)

## 2021-09-06 LAB — HEMOGLOBIN A1C
Hgb A1c MFr Bld: 11.7 % — ABNORMAL HIGH (ref 4.8–5.6)
Mean Plasma Glucose: 289.09 mg/dL

## 2021-09-06 LAB — HIV ANTIBODY (ROUTINE TESTING W REFLEX): HIV Screen 4th Generation wRfx: NONREACTIVE

## 2021-09-06 MED ORDER — INSULIN GLARGINE-YFGN 100 UNIT/ML ~~LOC~~ SOLN
38.0000 [IU] | Freq: Every day | SUBCUTANEOUS | Status: DC
  Filled 2021-09-06: qty 0.38

## 2021-09-06 MED ORDER — INSULIN ASPART 100 UNIT/ML IJ SOLN
0.0000 [IU] | Freq: Three times a day (TID) | INTRAMUSCULAR | Status: DC
  Administered 2021-09-06 (×2): 8 [IU] via SUBCUTANEOUS
  Administered 2021-09-07: 3 [IU] via SUBCUTANEOUS
  Administered 2021-09-07: 11 [IU] via SUBCUTANEOUS
  Administered 2021-09-08: 3 [IU] via SUBCUTANEOUS
  Administered 2021-09-08: 8 [IU] via SUBCUTANEOUS
  Administered 2021-09-08: 11 [IU] via SUBCUTANEOUS
  Filled 2021-09-06: qty 0.15

## 2021-09-06 NOTE — Progress Notes (Signed)
PROGRESS NOTE    James Chang   WYO:[id number]  DOB: 1966/08/28  DOA: 09/05/2021 PCP: Oneita Hurt, No   Brief Narrative:  James Chang is a 55 y.o. male with medical history of diabetes mellitus on insulin, hypertension, hyperlipidemia, cocaine abuse, CVA, depression and anxiety.  The patient has been frequenting the ED for this past week with complaints of nausea vomiting and abdominal pain.  Today he was going to an appointment at his PCPs office and became unconscious.  He was noted to be hypotensive with a blood pressure of 82/64 when the EMS arrived and a fluid bolus was given.  His blood glucose was noted to be 466. In the ED, he continued to complain of feeling lightheaded nauseated and generally unwell.   Subjective: He states he has nausea but no vomiting today.     Assessment & Plan:   Active Problems: Acute renal failure-chronic kidney disease stage IIIb Hyponatremia -Creatinine has more than doubled over the past week - ikely prerenal due to vomiting in the setting of chronic ARB use - Hold losartan - Continue IV fluids until he is able to increase his oral intake- his Cr is slightly improved today -Follow I's and O's   Abdominal pain nausea and vomiting/ leukocytosis - A CT scan of the abdomen and pelvis with was performed on 10/20 and was unrevealing - 2 view abdominal x-ray today is also unrevealing -He is not having any diarrhea - He has not had any fevers or chills-there is no hematemesis - At this time the etiology for his vomiting is undetermined-questionable if it was viral related initially - advance diet as tolerated     Hypotension with a history of essential hypertension - He has both lisinopril and losartan listed on his med rec but in his bag, he only has a bottle of lisinopril-we will be holding this - Follow BP-he currently is  Diabetes mellitus-type II, insulin requiring - His blood sugars are elevated in the 2-300 range-I  suspect his high sugars are also contributing to his dehydration - A1c is 11.7 - He states that he has been taking his Lantus but has not been taking his NovoLog specifically because he has not eaten in over a week - cont Lantus and SSI and increase lantus today   Cocaine abuse - Have counseled the patient to avoid cocaine in the future - The patient states that it has been about a week and a half since he last used it   Insomnia - Continue Seroquel   Depression - Continue fluoxetine   Dyslipidemia - Hold Lipitor for now until GI symptoms improve   Homeless - He is currently staying in a motel which is being paid by [facility]    Time spent in minutes: 35 DVT prophylaxis: heparin injection 5,000 Units Start: 09/05/21 2200  Code Status: DNR Family Communication:  Level of Care: Level of care: Telemetry Disposition Plan:  Status is: Inpatient  Remains inpatient appropriate because: continue to advance diet and cont iVF      Consultants:  none Procedures:  none Antimicrobials:  Anti-infectives (From admission, onward)    None        Objective: Vitals:   09/06/21 0607 09/06/21 0900 09/06/21 1200 09/06/21 1523  BP: 124/73 (!) 92/57 123/69 107/70  Pulse: 78 79 90 88  Resp: 19 15 (!) 26 18  Temp: 98.6 F (37 C)     TempSrc:      SpO2: 100% 100% 100% 99%  No intake or output data in the 24 hours ending 09/06/21 1822 There were no vitals filed for this visit.  Examination: General exam: Appears comfortable  HEENT: PERRLA, oral mucosa moist, no sclera icterus or thrush Respiratory system: Clear to auscultation. Respiratory effort normal. Cardiovascular system: S1 & S2 heard, RRR.   Gastrointestinal system: Abdomen soft, non-tender, nondistended. Normal bowel sounds. Central nervous system: Alert and oriented. No focal neurological deficits. Extremities: No cyanosis, clubbing or edema Skin: No rashes or ulcers Psychiatry:  Mood & affect appropriate.      Data Reviewed: I have personally reviewed following labs and imaging studies  CBC: Recent Labs  Lab 08/31/21 0850 09/01/21 0948 09/05/21 1434 09/06/21 0254  WBC 8.5 11.3* 11.6* 8.0  NEUTROABS  --   --  8.7*  --   HGB 12.3* 12.3* 12.8* 10.5*  HCT 36.3* 35.3* 36.7* 29.5*  MCV 83.6 82.5 81.2 80.8  PLT 326 316 325 264   Basic Metabolic Panel: Recent Labs  Lab 08/31/21 0850 09/01/21 1000 09/05/21 1434 09/06/21 0254  NA 131* 132* 128* 129*  K 5.1 4.0 4.3 3.8  CL 98 97* 93* 99  CO2 23 22 25 22   GLUCOSE 330* 381* 356* 369*  BUN 21* 29* 50* 45*  CREATININE 1.49* 1.70* 3.08* 2.00*  CALCIUM 9.7 9.6 9.3 7.6*  MG  --  1.8  --   --   PHOS  --   --  4.1  --    GFR: Estimated Creatinine Clearance: 40.7 mL/min (A) (by C-G formula based on SCr of 2 mg/dL (H)). Liver Function Tests: Recent Labs  Lab 08/31/21 0850 09/01/21 1000 09/05/21 1434  AST 17 12* 10*  ALT 12 12 12   ALKPHOS 58 57 63  BILITOT 2.0* 1.6* 1.1  PROT 6.9 7.0 7.3  ALBUMIN 3.8 3.9 3.8   Recent Labs  Lab 08/31/21 0850 09/05/21 1434  LIPASE 21 26   No results for input(s): AMMONIA in the last 168 hours. Coagulation Profile: No results for input(s): INR, PROTIME in the last 168 hours. Cardiac Enzymes: No results for input(s): CKTOTAL, CKMB, CKMBINDEX, TROPONINI in the last 168 hours. BNP (last 3 results) No results for input(s): PROBNP in the last 8760 hours. HbA1C: Recent Labs    09/06/21 0931  HGBA1C 11.7*   CBG: Recent Labs  Lab 09/01/21 1208 09/05/21 1403 09/06/21 0831 09/06/21 1234 09/06/21 1612  GLUCAP 345* 352* 274* 273* 111*   Lipid Profile: No results for input(s): CHOL, HDL, LDLCALC, TRIG, CHOLHDL, LDLDIRECT in the last 72 hours. Thyroid Function Tests: No results for input(s): TSH, T4TOTAL, FREET4, T3FREE, THYROIDAB in the last 72 hours. Anemia Panel: No results for input(s): VITAMINB12, FOLATE, FERRITIN, TIBC, IRON, RETICCTPCT in the last 72 hours. Urine analysis:     Component Value Date/Time   COLORURINE YELLOW 09/06/2021 0542   APPEARANCEUR CLEAR 09/06/2021 0542   LABSPEC 1.013 09/06/2021 0542   PHURINE 5.0 09/06/2021 0542   GLUCOSEU >=500 (A) 09/06/2021 0542   HGBUR NEGATIVE 09/06/2021 0542   BILIRUBINUR NEGATIVE 09/06/2021 0542   KETONESUR NEGATIVE 09/06/2021 0542   PROTEINUR NEGATIVE 09/06/2021 0542   NITRITE NEGATIVE 09/06/2021 0542   LEUKOCYTESUR NEGATIVE 09/06/2021 0542   Sepsis Labs: @LABRCNTIP (procalcitonin:4,lacticidven:4) ) Recent Results (from the past 240 hour(s))  Resp Panel by RT-PCR (Flu A&B, Covid) Nasopharyngeal Swab     Status: None   Collection Time: 09/05/21  5:31 PM   Specimen: Nasopharyngeal Swab; Nasopharyngeal(NP) swabs in vial transport medium  Result Value Ref Range Status  SARS Coronavirus 2 by RT PCR NEGATIVE NEGATIVE Final    Comment: (NOTE) SARS-CoV-2 target nucleic acids are NOT DETECTED.  The SARS-CoV-2 RNA is generally detectable in upper respiratory specimens during the acute phase of infection. The lowest concentration of SARS-CoV-2 viral copies this assay can detect is 138 copies/mL. A negative result does not preclude SARS-Cov-2 infection and should not be used as the sole basis for treatment or other patient management decisions. A negative result may occur with  improper specimen collection/handling, submission of specimen other than nasopharyngeal swab, presence of viral mutation(s) within the areas targeted by this assay, and inadequate number of viral copies(<138 copies/mL). A negative result must be combined with clinical observations, patient history, and epidemiological information. The expected result is Negative.  Fact Sheet for Patients:  BloggerCourse.com  Fact Sheet for Healthcare Providers:  SeriousBroker.it  This test is no t yet approved or cleared by the [place] FDA and  has been authorized for detection and/or diagnosis  of SARS-CoV-2 by FDA under an Emergency Use Authorization (EUA). This EUA will remain  in effect (meaning this test can be used) for the duration of the COVID-19 declaration under Section 564(b)(1) of the Act, 21 U.S.C.section 360bbb-3(b)(1), unless the authorization is terminated  or revoked sooner.       Influenza A by PCR NEGATIVE NEGATIVE Final   Influenza B by PCR NEGATIVE NEGATIVE Final    Comment: (NOTE) The Xpert Xpress SARS-CoV-2/FLU/RSV plus assay is intended as an aid in the diagnosis of influenza from Nasopharyngeal swab specimens and should not be used as a sole basis for treatment. Nasal washings and aspirates are unacceptable for Xpert Xpress SARS-CoV-2/FLU/RSV testing.  Fact Sheet for Patients: BloggerCourse.com  Fact Sheet for Healthcare Providers: SeriousBroker.it  This test is not yet approved or cleared by the [place] FDA and has been authorized for detection and/or diagnosis of SARS-CoV-2 by FDA under an Emergency Use Authorization (EUA). This EUA will remain in effect (meaning this test can be used) for the duration of the COVID-19 declaration under Section 564(b)(1) of the Act, 21 U.S.C. section 360bbb-3(b)(1), unless the authorization is terminated or revoked.  Performed at [facility], [address] [place], [place] [address]          Radiology Studies: DG Abd 2 Views  Result Date: 09/05/2021 CLINICAL DATA:  Abdomen pain vomiting and nausea EXAM: ABDOMEN - 2 VIEW COMPARISON:  03/22/2020, CT 08/30/2021 FINDINGS: The bowel gas pattern is normal. There is no evidence of free air. No radio-opaque calculi or other significant radiographic abnormality is seen. Moderate to large quantity of stool in the colon. IMPRESSION: Negative. Electronically Signed   By: Jasmine Pang M.D.   On: 09/05/2021 18:18      Scheduled Meds:  FLUoxetine  10 mg Oral Daily   heparin  5,000 Units  Subcutaneous Q8H   insulin aspart  0-15 Units Subcutaneous TID WC   insulin glargine-yfgn  30 Units Subcutaneous QHS   pantoprazole (PROTONIX) IV  40 mg Intravenous Q24H   sodium chloride flush  3 mL Intravenous Q12H   Continuous Infusions:  sodium chloride 100 mL/hr at 09/06/21 0926     LOS: 1 day      Calvert Cantor, MD [facility] Pager: [email or web address] 09/06/2021, 6:22 PM

## 2021-09-06 NOTE — Progress Notes (Signed)
Inpatient Diabetes Program Recommendations  AACE/ADA: New Consensus Statement on Inpatient Glycemic Control (2015)  Target Ranges:  Prepandial:   less than 140 mg/dL      Peak postprandial:   less than 180 mg/dL (1-2 hours)      Critically ill patients:  140 - 180 mg/dL   Lab Results  Component Value Date   GLUCAP 273 (H) 09/06/2021   HGBA1C 11.7 (H) 09/06/2021    Review of Glycemic Control Results for RHEA, THRUN (MRN [id number]) as of 09/06/2021 13:29  Ref. Range 09/05/2021 14:03 09/06/2021 08:31 09/06/2021 12:34  Glucose-Capillary Latest Ref Range: 70 - 99 mg/dL 664 (H) 403 (H) 474 (H)   Diabetes history: DM 2 Outpatient Diabetes medications: Lantus 40 units, Humalog 5 units tid Current orders for Inpatient glycemic control: Semglee 30 units Novolog 0-15 units tid  Inpatient Diabetes Program Recommendations:    - consider increasing Semglee to 35 units -  Add Novolog 4 units tid meal coverage if eating >50% of meals  Thanks,  Christena Deem RN, MSN, BC-ADM Inpatient Diabetes Coordinator Team Pager [phone] (8a-5p)

## 2021-09-07 DIAGNOSIS — R112 Nausea with vomiting, unspecified: Secondary | ICD-10-CM | POA: Diagnosis not present

## 2021-09-07 DIAGNOSIS — N179 Acute kidney failure, unspecified: Secondary | ICD-10-CM | POA: Diagnosis not present

## 2021-09-07 LAB — BASIC METABOLIC PANEL
Anion gap: 6 (ref 5–15)
BUN: 29 mg/dL — ABNORMAL HIGH (ref 6–20)
CO2: 24 mmol/L (ref 22–32)
Calcium: 8.6 mg/dL — ABNORMAL LOW (ref 8.9–10.3)
Chloride: 101 mmol/L (ref 98–111)
Creatinine, Ser: 1.57 mg/dL — ABNORMAL HIGH (ref 0.61–1.24)
GFR, Estimated: 51 mL/min — ABNORMAL LOW (ref >60)
Glucose, Bld: 290 mg/dL — ABNORMAL HIGH (ref 70–99)
Potassium: 4.7 mmol/L (ref 3.5–5.1)
Sodium: 131 mmol/L — ABNORMAL LOW (ref 135–145)

## 2021-09-07 LAB — GLUCOSE, CAPILLARY
Glucose-Capillary: 313 mg/dL — ABNORMAL HIGH (ref 70–99)
Glucose-Capillary: 323 mg/dL — ABNORMAL HIGH (ref 70–99)
Glucose-Capillary: 174 mg/dL — ABNORMAL HIGH (ref 70–99)
Glucose-Capillary: 104 mg/dL — ABNORMAL HIGH (ref 70–99)
Glucose-Capillary: 187 mg/dL — ABNORMAL HIGH (ref 70–99)

## 2021-09-07 MED ORDER — LIP MEDEX EX OINT
TOPICAL_OINTMENT | CUTANEOUS | Status: AC
  Filled 2021-09-07: qty 7

## 2021-09-07 MED ORDER — INSULIN GLARGINE-YFGN 100 UNIT/ML ~~LOC~~ SOLN
30.0000 [IU] | Freq: Every day | SUBCUTANEOUS | Status: DC
  Administered 2021-09-08: 30 [IU] via SUBCUTANEOUS
  Filled 2021-09-07: qty 0.3

## 2021-09-07 MED ORDER — PANTOPRAZOLE SODIUM 40 MG PO TBEC
40.0000 mg | DELAYED_RELEASE_TABLET | Freq: Every day | ORAL | Status: DC
  Administered 2021-09-07 – 2021-09-08 (×2): 40 mg via ORAL
  Filled 2021-09-07 (×2): qty 1

## 2021-09-07 MED ORDER — GABAPENTIN 100 MG PO CAPS
100.0000 mg | ORAL_CAPSULE | Freq: Three times a day (TID) | ORAL | Status: DC
  Administered 2021-09-07 – 2021-09-09 (×7): 100 mg via ORAL
  Filled 2021-09-07 (×7): qty 1

## 2021-09-07 MED ORDER — INSULIN ASPART 100 UNIT/ML IJ SOLN
5.0000 [IU] | Freq: Three times a day (TID) | INTRAMUSCULAR | Status: DC
  Administered 2021-09-07: 5 [IU] via SUBCUTANEOUS

## 2021-09-07 MED ORDER — INSULIN GLARGINE-YFGN 100 UNIT/ML ~~LOC~~ SOLN: 45.0000 [IU] | Freq: Every day | SUBCUTANEOUS | Status: DC

## 2021-09-07 MED ORDER — INSULIN GLARGINE-YFGN 100 UNIT/ML ~~LOC~~ SOLN
45.0000 [IU] | Freq: Every day | SUBCUTANEOUS | Status: DC
  Administered 2021-09-07: 45 [IU] via SUBCUTANEOUS
  Filled 2021-09-07: qty 0.45

## 2021-09-07 MED ORDER — INSULIN ASPART 100 UNIT/ML IJ SOLN
5.0000 [IU] | Freq: Once | INTRAMUSCULAR | Status: AC
  Administered 2021-09-07: 5 [IU] via SUBCUTANEOUS

## 2021-09-07 NOTE — Progress Notes (Signed)
Inpatient Diabetes Program Recommendations  AACE/ADA: New Consensus Statement on Inpatient Glycemic Control (2015)  Target Ranges:  Prepandial:   less than 140 mg/dL      Peak postprandial:   less than 180 mg/dL (1-2 hours)      Critically ill patients:  140 - 180 mg/dL  Results for JAKIM, DRAPEAU (MRN [id number]) as of 09/07/2021 07:36  Ref. Range 09/06/2021 08:31 09/06/2021 12:34 09/06/2021 16:12  Glucose-Capillary Latest Ref Range: 70 - 99 mg/dL  30 units Semglee _0  274 (H)  8 units Novolog  273 (H)  8 units Novolog  111 (H)  Results for EON, ZUNKER (MRN [id number]) as of 09/07/2021 13:43  Ref. Range 09/07/2021 08:05 09/07/2021 11:28  Glucose-Capillary Latest Ref Range: 70 - 99 mg/dL 323 (H) 174 (H)  Results for KENDRIC, SINDELAR (MRN [id number]) as of 09/07/2021 07:36  Ref. Range 09/06/2021 09:31  Hemoglobin A1C Latest Ref Range: 4.8 - 5.6 % 11.7 (H)  (289 mg/dl)   Admit with:  Acute renal failure-chronic kidney disease stage IIIb Hyponatremia Abdominal pain nausea and vomiting/ leukocytosis Hypotension with a history of essential hypertension  History: DM2, Cocaine Abuse, CVA  Home DM Meds: Lantus 40 units QHS       Humalog 5 units TID with meals  Current Orders: Semglee 38 units QHS      Novolog Moderate Correction Scale/ SSI (0-15 units) TID AC    Note that Semglee 38 units was NOT given last PM (10/27).  Contacted Dr. Wynelle Cleveland to really this info--Semglee timing adjusted to Daytime dosing as result and RN made aware.  Met w/ pt at bedside this AM.   Pt told me he was taking his Lantus but didn't take his Humalog for about 1 week b/c he couldn't eat.  Has only been checking CBGs once per day in the AM and stated sometimes they were in the 150 range and then they jumped up to >400 the last few days prior to admission.  Told me he was in the process of completing his 1st visit with new MD at [facility] on [address]. But passed out and was sent to the  ED before seeing his new MD.  Has both Insulins at home and gets for free with his Medicaid.  Has CBG meter.    We discussed his elevated A1c of 11.7%--Pt told me that 11.7% is actually an improvement b/c his last A1c about 2-3 months ago was 15.2%.  Knows his A1c needs to be closer to 7% for better health.  Asked me about an insulin pump.  We discussed that pt will need to get established with his PCP 1st and then should ask his PCP for referral to an ENDO--Attached list of local [place] Endo's in pt's AVS.  I discussed with pt what an insulin pump is and how it works.  I also discussed with pt that he will need to show improvement with his CBGs and willingness to make efforts for better control before a pump will be considered as insulin pumps require extensive training.  I encouraged pt to check his CBGs at least TID before meals at home for the next month.  We talked about the possibility of him needing a Humalog SSI regimen at home as well to help correct high CBGs.  Will send request to Hospital MD asking for Humalog SSI for pt to use at home in addition to his meal Coverage.    --Will follow patient during hospitalization--  Kaleeyah Cuffie  Lowella Dell RN, MSN, CDE Diabetes Coordinator Inpatient Glycemic Control Team Team Pager: [phone] (8a-5p)

## 2021-09-07 NOTE — Progress Notes (Signed)
Pt arrived to unit via wheelchair. Alert and oriented x4. Steady gait. Oriented to room and callbell with no complications. Video initiated. Initial assessment and 2 RN assessment completed. Will continue to monitor

## 2021-09-07 NOTE — Care Management Important Message (Signed)
Medicare IM printed for [facility] Social Work to give to the patient. 

## 2021-09-07 NOTE — Progress Notes (Addendum)
PROGRESS NOTE    James Chang   YHC:[id number]  DOB: April 14, 1966  DOA: 09/05/2021 PCP: Oneita Hurt, No   Brief Narrative:  James Chang is a 55 y.o. male with medical history of diabetes mellitus on insulin, hypertension, hyperlipidemia, cocaine abuse, CVA, depression and anxiety.  The patient has been frequenting the ED for this past week with complaints of nausea vomiting and abdominal pain.  Today he was going to an appointment at his PCPs office and became unconscious.  He was noted to be hypotensive with a blood pressure of 82/64 when the EMS arrived and a fluid bolus was given.  His blood glucose was noted to be 466. In the ED, he continued to complain of feeling lightheaded nauseated and generally unwell.   Subjective: He states he has nausea but no vomiting today.     Assessment & Plan:   Active Problems: Acute renal failure-chronic kidney disease stage IIIb Hyponatremia -Creatinine has more than doubled over the past week - likely prerenal due to vomiting in the setting of chronic ARB use - Hold losartan - Continue IV fluids  today   Abdominal pain nausea and vomiting/ leukocytosis - A CT scan of the abdomen and pelvis with was performed on 10/20 and was unrevealing - 2 view abdominal x-ray today is also unrevealing -He is not having any diarrhea - He has not had any fevers or chills-there is no hematemesis - At this time the etiology for his vomiting is undetermined-questionable if it was viral related initially - advance diet as tolerated- now tolerating solid food    Hypotension with a history of essential hypertension - He has both lisinopril and losartan listed on his med rec but in his bag, he only has a bottle of lisinopril - holding both - BP 126/82 today- follow  Diabetes mellitus-type II, insulin requiring - His blood sugars are elevated in the 2-300 range-I suspect his high sugars are also contributing to his dehydration - A1c is 11.7 -  He states that he has been taking his Lantus but has not been taking his NovoLog specifically because he has not eaten in over a week - cont Lantus and SSI a-    - dietician consult requested for education  Diabetic neuropathy - start Gabapentin 100 mg TID  Diabetic retinopathy - needs better control of sugars- discussed with patient  Small skin tear on right 1st toe - no bleeding or infection noted- follow   Cocaine abuse - Have counseled the patient to avoid cocaine in the future   Depression - Continue fluoxetine   Dyslipidemia - Hold Lipitor for now until GI symptoms improve   Homeless - He is currently staying in a motel which is being paid by [facility] - he is on disability   Time spent in minutes: 35 DVT prophylaxis: heparin injection 5,000 Units Start: 09/05/21 2200  Code Status: DNR Family Communication:  Level of Care: Level of care: Telemetry Disposition Plan:  Status is: Inpatient  Remains inpatient appropriate because: continue to advance diet and cont iVF  Consultants:  none Procedures:  none Antimicrobials:  Anti-infectives (From admission, onward)    None        Objective: Vitals:   09/07/21 0327 09/07/21 0725 09/07/21 1125 09/07/21 1525  BP: (!) 170/88 (!) 162/85 126/82 132/72  Pulse: 86 82 81 83  Resp: 20 20 19 20   Temp: 98.7 F (37.1 C) 98.6 F (37 C) 98.5 F (36.9 C) 98.3 F (36.8 C)  TempSrc: Oral Oral Oral Oral  SpO2: 100% 100% 100% 100%    Intake/Output Summary (Last 24 hours) at 09/07/2021 1756 Last data filed at 09/07/2021 1400 Gross per 24 hour  Intake 1590 ml  Output 1950 ml  Net -360 ml   There were no vitals filed for this visit.  Examination: General exam: Appears comfortable  HEENT: PERRLA, oral mucosa moist, no sclera icterus or thrush Respiratory system: Clear to auscultation. Respiratory effort normal. Cardiovascular system: S1 & S2 heard, RRR.   Gastrointestinal system: Abdomen soft, non-tender,  nondistended. Normal bowel sounds. Central nervous system: Alert and oriented. No focal neurological deficits. Extremities: No cyanosis, clubbing or edema Skin: No rashes or ulcers Psychiatry:  Mood & affect appropriate.     Data Reviewed: I have personally reviewed following labs and imaging studies  CBC: Recent Labs  Lab 09/01/21 0948 09/05/21 1434 09/06/21 0254  WBC 11.3* 11.6* 8.0  NEUTROABS  --  8.7*  --   HGB 12.3* 12.8* 10.5*  HCT 35.3* 36.7* 29.5*  MCV 82.5 81.2 80.8  PLT 316 325 264   Basic Metabolic Panel: Recent Labs  Lab 09/01/21 1000 09/05/21 1434 09/06/21 0254 09/07/21 0440  NA 132* 128* 129* 131*  K 4.0 4.3 3.8 4.7  CL 97* 93* 99 101  CO2 22 25 22 24   GLUCOSE 381* 356* 369* 290*  BUN 29* 50* 45* 29*  CREATININE 1.70* 3.08* 2.00* 1.57*  CALCIUM 9.6 9.3 7.6* 8.6*  MG 1.8  --   --   --   PHOS  --  4.1  --   --    GFR: Estimated Creatinine Clearance: 51.9 mL/min (A) (by C-G formula based on SCr of 1.57 mg/dL (H)). Liver Function Tests: Recent Labs  Lab 09/01/21 1000 09/05/21 1434  AST 12* 10*  ALT 12 12  ALKPHOS 57 63  BILITOT 1.6* 1.1  PROT 7.0 7.3  ALBUMIN 3.9 3.8   Recent Labs  Lab 09/05/21 1434  LIPASE 26   No results for input(s): AMMONIA in the last 168 hours. Coagulation Profile: No results for input(s): INR, PROTIME in the last 168 hours. Cardiac Enzymes: No results for input(s): CKTOTAL, CKMB, CKMBINDEX, TROPONINI in the last 168 hours. BNP (last 3 results) No results for input(s): PROBNP in the last 8760 hours. HbA1C: Recent Labs    09/06/21 0931  HGBA1C 11.7*   CBG: Recent Labs  Lab 09/06/21 1612 09/07/21 0331 09/07/21 0805 09/07/21 1128 09/07/21 1658  GLUCAP 111* 313* 323* 174* 104*   Lipid Profile: No results for input(s): CHOL, HDL, LDLCALC, TRIG, CHOLHDL, LDLDIRECT in the last 72 hours. Thyroid Function Tests: No results for input(s): TSH, T4TOTAL, FREET4, T3FREE, THYROIDAB in the last 72 hours. Anemia  Panel: No results for input(s): VITAMINB12, FOLATE, FERRITIN, TIBC, IRON, RETICCTPCT in the last 72 hours. Urine analysis:    Component Value Date/Time   COLORURINE YELLOW 09/06/2021 0542   APPEARANCEUR CLEAR 09/06/2021 0542   LABSPEC 1.013 09/06/2021 0542   PHURINE 5.0 09/06/2021 0542   GLUCOSEU >=500 (A) 09/06/2021 0542   HGBUR NEGATIVE 09/06/2021 0542   BILIRUBINUR NEGATIVE 09/06/2021 0542   KETONESUR NEGATIVE 09/06/2021 0542   PROTEINUR NEGATIVE 09/06/2021 0542   NITRITE NEGATIVE 09/06/2021 0542   LEUKOCYTESUR NEGATIVE 09/06/2021 0542   Sepsis Labs: @LABRCNTIP (procalcitonin:4,lacticidven:4) ) Recent Results (from the past 240 hour(s))  Resp Panel by RT-PCR (Flu A&B, Covid) Nasopharyngeal Swab     Status: None   Collection Time: 09/05/21  5:31 PM   Specimen: Nasopharyngeal Swab;  Nasopharyngeal(NP) swabs in vial transport medium  Result Value Ref Range Status   SARS Coronavirus 2 by RT PCR NEGATIVE NEGATIVE Final    Comment: (NOTE) SARS-CoV-2 target nucleic acids are NOT DETECTED.  The SARS-CoV-2 RNA is generally detectable in upper respiratory specimens during the acute phase of infection. The lowest concentration of SARS-CoV-2 viral copies this assay can detect is 138 copies/mL. A negative result does not preclude SARS-Cov-2 infection and should not be used as the sole basis for treatment or other patient management decisions. A negative result may occur with  improper specimen collection/handling, submission of specimen other than nasopharyngeal swab, presence of viral mutation(s) within the areas targeted by this assay, and inadequate number of viral copies(<138 copies/mL). A negative result must be combined with clinical observations, patient history, and epidemiological information. The expected result is Negative.  Fact Sheet for Patients:  BloggerCourse.com  Fact Sheet for Healthcare Providers:   SeriousBroker.it  This test is no t yet approved or cleared by the [place] FDA and  has been authorized for detection and/or diagnosis of SARS-CoV-2 by FDA under an Emergency Use Authorization (EUA). This EUA will remain  in effect (meaning this test can be used) for the duration of the COVID-19 declaration under Section 564(b)(1) of the Act, 21 U.S.C.section 360bbb-3(b)(1), unless the authorization is terminated  or revoked sooner.       Influenza A by PCR NEGATIVE NEGATIVE Final   Influenza B by PCR NEGATIVE NEGATIVE Final    Comment: (NOTE) The Xpert Xpress SARS-CoV-2/FLU/RSV plus assay is intended as an aid in the diagnosis of influenza from Nasopharyngeal swab specimens and should not be used as a sole basis for treatment. Nasal washings and aspirates are unacceptable for Xpert Xpress SARS-CoV-2/FLU/RSV testing.  Fact Sheet for Patients: BloggerCourse.com  Fact Sheet for Healthcare Providers: SeriousBroker.it  This test is not yet approved or cleared by the [place] FDA and has been authorized for detection and/or diagnosis of SARS-CoV-2 by FDA under an Emergency Use Authorization (EUA). This EUA will remain in effect (meaning this test can be used) for the duration of the COVID-19 declaration under Section 564(b)(1) of the Act, 21 U.S.C. section 360bbb-3(b)(1), unless the authorization is terminated or revoked.  Performed at [facility], [address], [place], [place] [address]          Radiology Studies: No results found.    Scheduled Meds:  FLUoxetine  10 mg Oral Daily   gabapentin  100 mg Oral TID   heparin  5,000 Units Subcutaneous Q8H   insulin aspart  0-15 Units Subcutaneous TID WC   [START ON 09/08/2021] insulin glargine-yfgn  30 Units Subcutaneous Daily   pantoprazole  40 mg Oral Daily   sodium chloride flush  3 mL Intravenous Q12H    Continuous Infusions:  sodium chloride 75 mL/hr at 09/07/21 0836     LOS: 2 days      Calvert Cantor, MD [facility] Pager: [email or web address] 09/07/2021, 5:56 PM

## 2021-09-07 NOTE — Progress Notes (Signed)
PHARMACIST - PHYSICIAN COMMUNICATION  DR: Butler Denmark  CONCERNING: IV to Oral Route Change Policy  RECOMMENDATION: This patient is receiving pantoprazole by the intravenous route.  Based on criteria approved by the Pharmacy and Therapeutics Committee, the intravenous medication(s) is/are being converted to the equivalent oral dose form(s).   DESCRIPTION: These criteria include: The patient is eating (either orally or via tube) and/or has been taking other orally administered medications for a least 24 hours The patient has no evidence of active gastrointestinal bleeding or impaired GI absorption (gastrectomy, short bowel, patient on TNA or NPO).  If you have questions about this conversion, please contact the Pharmacy Department  []   [phone] )  ( [phone] []   [phone] )  [facility] []   [phone] )  [facility] []   [phone] )  [facility] [x]   [phone] )  [facility]   ( [phone], PharmD 09/07/2021 9:26 AM

## 2021-09-07 NOTE — TOC Progression Note (Signed)
Transition of Care [facility]) - Progression Note    Patient Details  Name: James Chang MRN: [id number] Date of Birth: [birth year]  Transition of Care [facility]) CM/SW Contact  Ida Rogue, [place] Phone Number: 09/07/2021, 4:11 PM  Clinical Narrative:   Spoke with patient who says he has been somewhat unsteady on his feet, needs a rollator.  His PCP is [facility] [address], [place], [place] [address] Phone: [phone] I asked MD for order, as well as PT eval to see if he needs [facility] services.  Spoke with Barbette Or with [facility] to order rollator.  Messaged MD to ask her for DME order as well as PT evaluation.  Patient will need a ride home on [facility]-not uber but one of the vans to make sure he gets back into the [place] hotel [place]. TOC will continue to follow during the course of hospitalization.     Expected Discharge Plan:  (TBD) Barriers to Discharge: Continued Medical Work up  Expected Discharge Plan and Services Expected Discharge Plan:  (TBD) In-house Referral: Clinical Social Work     Living arrangements for the past 2 months: Hotel/Motel                                       Social Determinants of Health (SDOH) Interventions    Readmission Risk Interventions No flowsheet data found.

## 2021-09-07 NOTE — ED Notes (Signed)
ED TO INPATIENT HANDOFF REPORT  Name/Age/Gender James Chang 55 y.o. male  Code Status    Code Status Orders  (From admission, onward)           Start     Ordered   09/05/21 1706  Do not attempt resuscitation (DNR)  Continuous       Question Answer Comment  In the event of cardiac or respiratory ARREST Do not call a "code blue"   In the event of cardiac or respiratory ARREST Do not perform Intubation, CPR, defibrillation or ACLS   In the event of cardiac or respiratory ARREST Use medication by any route, position, wound care, and other measures to relive pain and suffering. May use oxygen, suction and manual treatment of airway obstruction as needed for comfort.      09/05/21 1708           Code Status History     This patient has a current code status but no historical code status.       Home/SNF/Other Home  Chief Complaint AKI (acute kidney injury) (HCC) [N17.9]  Level of Care/Admitting Diagnosis ED Disposition     ED Disposition  Admit   Condition  --   Comment  Hospital Area: [facility] [[id number]]  Level of Care: Telemetry [5]  Admit to tele based on following criteria: Eval of Syncope  May admit patient to [facility] or [facility] if equivalent level of care is available:: Yes  Covid Evaluation: Confirmed COVID Negative  Date Laboratory Confirmed COVID Negative: 09/05/2021  Diagnosis: AKI (acute kidney injury) [facility]) [[id number]]  Admitting Physician: Calvert Cantor [[id number]]  Attending Physician: Calvert Cantor [[id number]]  Estimated length of stay: past midnight tomorrow  Certification:: I certify this patient will need inpatient services for at least 2 midnights          Medical History Past Medical History:  Diagnosis Date   Depression    Diabetes mellitus without complication (HCC)    History of migraine    Hyperlipidemia    Hypertension    Personal history of drug abuse (HCC)    Stroke (HCC) 2008     Allergies Allergies  Allergen Reactions   Metformin Diarrhea   Metformin And Related    Reglan [Metoclopramide]     IV Location/Drains/Wounds Patient Lines/Drains/Airways Status     Active Line/Drains/Airways     Name Placement date Placement time Site Days   Peripheral IV Left Antecubital --  --  Antecubital  --            Labs/Imaging Results for orders placed or performed during the hospital encounter of 09/05/21 (from the past 48 hour(s))  CBG monitoring, ED     Status: Abnormal   Collection Time: 09/05/21  2:03 PM  Result Value Ref Range   Glucose-Capillary 352 (H) 70 - 99 mg/dL    Comment: Glucose reference range applies only to samples taken after fasting for at least 8 hours.  CBC with Differential     Status: Abnormal   Collection Time: 09/05/21  2:34 PM  Result Value Ref Range   WBC 11.6 (H) 4.0 - 10.5 K/uL   RBC 4.52 4.22 - 5.81 MIL/uL   Hemoglobin 12.8 (L) 13.0 - 17.0 g/dL   HCT 37.1 (L) 06.2 - 69.4 %   MCV 81.2 80.0 - 100.0 fL   MCH 28.3 26.0 - 34.0 pg   MCHC 34.9 30.0 - 36.0 g/dL   RDW 85.4 62.7 - 03.5 %  Platelets 325 150 - 400 K/uL   nRBC 0.0 0.0 - 0.2 %   Neutrophils Relative % 75 %   Neutro Abs 8.7 (H) 1.7 - 7.7 K/uL   Lymphocytes Relative 17 %   Lymphs Abs 2.0 0.7 - 4.0 K/uL   Monocytes Relative 6 %   Monocytes Absolute 0.7 0.1 - 1.0 K/uL   Eosinophils Relative 1 %   Eosinophils Absolute 0.1 0.0 - 0.5 K/uL   Basophils Relative 0 %   Basophils Absolute 0.0 0.0 - 0.1 K/uL   Immature Granulocytes 1 %   Abs Immature Granulocytes 0.07 0.00 - 0.07 K/uL    Comment: Performed at [facility], [address], [place], [place] [address]  Comprehensive metabolic panel     Status: Abnormal   Collection Time: 09/05/21  2:34 PM  Result Value Ref Range   Sodium 128 (L) 135 - 145 mmol/L   Potassium 4.3 3.5 - 5.1 mmol/L   Chloride 93 (L) 98 - 111 mmol/L   CO2 25 22 - 32 mmol/L   Glucose, Bld 356 (H) 70 - 99 mg/dL    Comment:  Glucose reference range applies only to samples taken after fasting for at least 8 hours.   BUN 50 (H) 6 - 20 mg/dL   Creatinine, Ser 0.17 (H) 0.61 - 1.24 mg/dL   Calcium 9.3 8.9 - 79.3 mg/dL   Total Protein 7.3 6.5 - 8.1 g/dL   Albumin 3.8 3.5 - 5.0 g/dL   AST 10 (L) 15 - 41 U/L   ALT 12 0 - 44 U/L   Alkaline Phosphatase 63 38 - 126 U/L   Total Bilirubin 1.1 0.3 - 1.2 mg/dL   GFR, Estimated 23 (L) >60 mL/min    Comment: (NOTE) Calculated using the CKD-EPI Creatinine Equation (2021)    Anion gap 10 5 - 15    Comment: Performed at [facility], [address], [place], [place] [address]  Lipase, blood     Status: None   Collection Time: 09/05/21  2:34 PM  Result Value Ref Range   Lipase 26 11 - 51 U/L    Comment: Performed at [facility], [address], [place], [place] [address]  Phosphorus     Status: None   Collection Time: 09/05/21  2:34 PM  Result Value Ref Range   Phosphorus 4.1 2.5 - 4.6 mg/dL    Comment: Performed at [facility], [address], [place], [place] [address]  HIV Antibody (routine testing w rflx)     Status: None   Collection Time: 09/05/21  5:06 PM  Result Value Ref Range   HIV Screen 4th Generation wRfx Non Reactive Non Reactive    Comment: Performed at [facility], [address], [place], [place] [address]  Resp Panel by RT-PCR (Flu A&B, Covid) Nasopharyngeal Swab     Status: None   Collection Time: 09/05/21  5:31 PM   Specimen: Nasopharyngeal Swab; Nasopharyngeal(NP) swabs in vial transport medium  Result Value Ref Range   SARS Coronavirus 2 by RT PCR NEGATIVE NEGATIVE    Comment: (NOTE) SARS-CoV-2 target nucleic acids are NOT DETECTED.  The SARS-CoV-2 RNA is generally detectable in upper respiratory specimens during the acute phase of infection. The lowest concentration of SARS-CoV-2 viral copies this assay can detect is 138 copies/mL. A negative result does not preclude  SARS-Cov-2 infection and should not be used as the sole basis for treatment or other patient management  decisions. A negative result may occur with  improper specimen collection/handling, submission of specimen other than nasopharyngeal swab, presence of viral mutation(s) within the areas targeted by this assay, and inadequate number of viral copies(<138 copies/mL). A negative result must be combined with clinical observations, patient history, and epidemiological information. The expected result is Negative.  Fact Sheet for Patients:  BloggerCourse.com  Fact Sheet for Healthcare Providers:  SeriousBroker.it  This test is no t yet approved or cleared by the [place] FDA and  has been authorized for detection and/or diagnosis of SARS-CoV-2 by FDA under an Emergency Use Authorization (EUA). This EUA will remain  in effect (meaning this test can be used) for the duration of the COVID-19 declaration under Section 564(b)(1) of the Act, 21 U.S.C.section 360bbb-3(b)(1), unless the authorization is terminated  or revoked sooner.       Influenza A by PCR NEGATIVE NEGATIVE   Influenza B by PCR NEGATIVE NEGATIVE    Comment: (NOTE) The Xpert Xpress SARS-CoV-2/FLU/RSV plus assay is intended as an aid in the diagnosis of influenza from Nasopharyngeal swab specimens and should not be used as a sole basis for treatment. Nasal washings and aspirates are unacceptable for Xpert Xpress SARS-CoV-2/FLU/RSV testing.  Fact Sheet for Patients: BloggerCourse.com  Fact Sheet for Healthcare Providers: SeriousBroker.it  This test is not yet approved or cleared by the [place] FDA and has been authorized for detection and/or diagnosis of SARS-CoV-2 by FDA under an Emergency Use Authorization (EUA). This EUA will remain in effect (meaning this test can be used) for the duration of the COVID-19  declaration under Section 564(b)(1) of the Act, 21 U.S.C. section 360bbb-3(b)(1), unless the authorization is terminated or revoked.  Performed at [facility] [place], [address], [place], [place] [address]   Basic metabolic panel     Status: Abnormal   Collection Time: 09/06/21  2:54 AM  Result Value Ref Range   Sodium 129 (L) 135 - 145 mmol/L   Potassium 3.8 3.5 - 5.1 mmol/L   Chloride 99 98 - 111 mmol/L   CO2 22 22 - 32 mmol/L   Glucose, Bld 369 (H) 70 - 99 mg/dL    Comment: Glucose reference range applies only to samples taken after fasting for at least 8 hours.   BUN 45 (H) 6 - 20 mg/dL   Creatinine, Ser 7.12 (H) 0.61 - 1.24 mg/dL    Comment: DELTA CHECK NOTED   Calcium 7.6 (L) 8.9 - 10.3 mg/dL   GFR, Estimated 38 (L) >60 mL/min    Comment: (NOTE) Calculated using the CKD-EPI Creatinine Equation (2021)    Anion gap 8 5 - 15    Comment: Performed at [facility], [address], [place], [place] [address]  CBC     Status: Abnormal   Collection Time: 09/06/21  2:54 AM  Result Value Ref Range   WBC 8.0 4.0 - 10.5 K/uL   RBC 3.65 (L) 4.22 - 5.81 MIL/uL   Hemoglobin 10.5 (L) 13.0 - 17.0 g/dL   HCT 98.3 (L) 38.2 - 50.5 %   MCV 80.8 80.0 - 100.0 fL   MCH 28.8 26.0 - 34.0 pg   MCHC 35.6 30.0 - 36.0 g/dL   RDW 39.7 67.3 - 41.9 %   Platelets 264 150 - 400 K/uL   nRBC 0.0 0.0 - 0.2 %    Comment: Performed at [facility], [address] [place], [place] [address]  Urinalysis, Routine w reflex microscopic Urine,  Clean Catch     Status: Abnormal   Collection Time: 09/06/21  5:42 AM  Result Value Ref Range   Color, Urine YELLOW YELLOW   APPearance CLEAR CLEAR   Specific Gravity, Urine 1.013 1.005 - 1.030   pH 5.0 5.0 - 8.0   Glucose, UA >=500 (A) NEGATIVE mg/dL   Hgb urine dipstick NEGATIVE NEGATIVE   Bilirubin Urine NEGATIVE NEGATIVE   Ketones, ur NEGATIVE NEGATIVE mg/dL   Protein, ur NEGATIVE NEGATIVE mg/dL   Nitrite  NEGATIVE NEGATIVE   Leukocytes,Ua NEGATIVE NEGATIVE   WBC, UA 0-5 0 - 5 WBC/hpf   Bacteria, UA RARE (A) NONE SEEN   Mucus PRESENT     Comment: Performed at [facility], [address] [place], [place] [address]  CBG monitoring, ED     Status: Abnormal   Collection Time: 09/06/21  8:31 AM  Result Value Ref Range   Glucose-Capillary 274 (H) 70 - 99 mg/dL    Comment: Glucose reference range applies only to samples taken after fasting for at least 8 hours.  Hemoglobin A1c     Status: Abnormal   Collection Time: 09/06/21  9:31 AM  Result Value Ref Range   Hgb A1c MFr Bld 11.7 (H) 4.8 - 5.6 %    Comment: (NOTE) Pre diabetes:          5.7%-6.4%  Diabetes:              >6.4%  Glycemic control for   <7.0% adults with diabetes    Mean Plasma Glucose 289.09 mg/dL    Comment: Performed at [facility], [address], [place], [place] [address]  CBG monitoring, ED     Status: Abnormal   Collection Time: 09/06/21 12:34 PM  Result Value Ref Range   Glucose-Capillary 273 (H) 70 - 99 mg/dL    Comment: Glucose reference range applies only to samples taken after fasting for at least 8 hours.  CBG monitoring, ED     Status: Abnormal   Collection Time: 09/06/21  4:12 PM  Result Value Ref Range   Glucose-Capillary 111 (H) 70 - 99 mg/dL    Comment: Glucose reference range applies only to samples taken after fasting for at least 8 hours.   DG Abd 2 Views  Result Date: 09/05/2021 CLINICAL DATA:  Abdomen pain vomiting and nausea EXAM: ABDOMEN - 2 VIEW COMPARISON:  03/22/2020, CT 08/30/2021 FINDINGS: The bowel gas pattern is normal. There is no evidence of free air. No radio-opaque calculi or other significant radiographic abnormality is seen. Moderate to large quantity of stool in the colon. IMPRESSION: Negative. Electronically Signed   By: Jasmine Pang M.D.   On: 09/05/2021 18:18    Pending Labs Unresulted Labs (From admission, onward)     Start     Ordered   09/07/21  0500  Basic metabolic panel  Tomorrow morning,   R        09/06/21 1826   09/05/21 1713  CBC  (heparin)  Add-on,   AD       Comments: Baseline for heparin therapy IF NOT ALREADY DRAWN.  Notify MD if PLT < 100 K.    09/05/21 1712   09/05/21 1713  Creatinine, serum  (heparin)  Add-on,   AD       Comments: Baseline for heparin therapy IF NOT ALREADY DRAWN.    09/05/21 1712            Vitals/Pain Today's Vitals   09/06/21  2015 09/06/21 2200 09/07/21 0105 09/07/21 0200  BP: (!) 122/96 99/62 105/60 99/66  Pulse: 85 83 91 80  Resp: Temp:      TempSrc:      SpO2: 100% 99% 96% 99%  PainSc:        Isolation Precautions No active isolations  Medications Medications  heparin injection 5,000 Units (5,000 Units Subcutaneous Not Given 09/06/21 2230)  sodium chloride flush (NS) 0.9 % injection 3 mL (3 mLs Intravenous Not Given 09/06/21 1139)  acetaminophen (TYLENOL) tablet 650 mg (650 mg Oral Given 09/06/21 1138)    Or  acetaminophen (TYLENOL) suppository 650 mg ( Rectal See Alternative 09/06/21 1138)  0.9 %  sodium chloride infusion ( Intravenous New Bag/Given 09/06/21 0926)  ondansetron (ZOFRAN) tablet 4 mg (has no administration in time range)    Or  ondansetron (ZOFRAN) injection 4 mg (has no administration in time range)  QUEtiapine (SEROQUEL) tablet 75 mg (has no administration in time range)  pantoprazole (PROTONIX) injection 40 mg (40 mg Intravenous Not Given 09/06/21 2230)  FLUoxetine (PROZAC) capsule 10 mg (10 mg Oral Given 09/06/21 0923)  insulin aspart (novoLOG) injection 0-15 Units (0 Units Subcutaneous Not Given 09/06/21 1637)  insulin glargine-yfgn (SEMGLEE) injection 38 Units (has no administration in time range)  lactated ringers bolus 1,000 mL (0 mLs Intravenous Stopped 09/05/21 2315)  influenza vac split quadrivalent PF (FLUARIX) injection 0.5 mL (0.5 mLs Intramuscular Given 09/06/21 1409)    Mobility walks

## 2021-09-07 NOTE — Discharge Instructions (Signed)
Local Endocrinologists [facility] [phone]) Dr. Carlus Pavlov Dr. Reather Littler [facility] [phone]) Dr. Talmage Coin [facility] [phone]) Dr. Dorisann Frames Dr. Marlene Lard

## 2021-09-08 ENCOUNTER — Other Ambulatory Visit: Payer: Self-pay

## 2021-09-08 DIAGNOSIS — N179 Acute kidney failure, unspecified: Secondary | ICD-10-CM | POA: Diagnosis not present

## 2021-09-08 DIAGNOSIS — R112 Nausea with vomiting, unspecified: Secondary | ICD-10-CM | POA: Diagnosis not present

## 2021-09-08 LAB — GLUCOSE, CAPILLARY
Glucose-Capillary: 285 mg/dL — ABNORMAL HIGH (ref 70–99)
Glucose-Capillary: 173 mg/dL — ABNORMAL HIGH (ref 70–99)
Glucose-Capillary: 304 mg/dL — ABNORMAL HIGH (ref 70–99)
Glucose-Capillary: 277 mg/dL — ABNORMAL HIGH (ref 70–99)

## 2021-09-08 LAB — BASIC METABOLIC PANEL
Anion gap: 8 (ref 5–15)
BUN: 27 mg/dL — ABNORMAL HIGH (ref 6–20)
CO2: 21 mmol/L — ABNORMAL LOW (ref 22–32)
Calcium: 8.9 mg/dL (ref 8.9–10.3)
Chloride: 102 mmol/L (ref 98–111)
Creatinine, Ser: 1.34 mg/dL — ABNORMAL HIGH (ref 0.61–1.24)
GFR, Estimated: >60 mL/min (ref >60)
Glucose, Bld: 243 mg/dL — ABNORMAL HIGH (ref 70–99)
Potassium: 4.8 mmol/L (ref 3.5–5.1)
Sodium: 131 mmol/L — ABNORMAL LOW (ref 135–145)

## 2021-09-08 MED ORDER — INSULIN GLARGINE-YFGN 100 UNIT/ML ~~LOC~~ SOLN
34.0000 [IU] | Freq: Every day | SUBCUTANEOUS | Status: DC
  Filled 2021-09-08: qty 0.34

## 2021-09-08 MED ORDER — POLYETHYLENE GLYCOL 3350 17 G PO PACK
17.0000 g | PACK | Freq: Every day | ORAL | Status: DC | PRN
  Administered 2021-09-08 – 2021-09-09 (×2): 17 g via ORAL
  Filled 2021-09-08 (×2): qty 1

## 2021-09-08 NOTE — Progress Notes (Signed)
PROGRESS NOTE    James Chang   HYI:[id number]  DOB: 1966-04-03  DOA: 09/05/2021 PCP: Oneita Hurt, No   Brief Narrative:  James Chang is a 55 y.o. male with medical history of diabetes mellitus on insulin, hypertension, hyperlipidemia, cocaine abuse, CVA, depression and anxiety.  The patient has been frequenting the ED for this past week with complaints of nausea vomiting and abdominal pain.  Today he was going to an appointment at his PCPs office and became unconscious.  He was noted to be hypotensive with a blood pressure of 82/64 when the EMS arrived and a fluid bolus was given.  His blood glucose was noted to be 466. In the ED, he continued to complain of feeling lightheaded nauseated and generally unwell.   Subjective: He has no complaints.   Assessment & Plan:   Active Problems: Acute renal failure-chronic kidney disease stage IIIb Hyponatremia -Creatinine has more than doubled over the past week - likely prerenal due to vomiting in the setting of chronic ARB use - Hold losartan- cont slow IVF- continue to control sugars   Abdominal pain nausea and vomiting/ leukocytosis - A CT scan of the abdomen and pelvis with was performed on 10/20 and was unrevealing - 2 view abdominal x-ray today is also unrevealing -He is not having any diarrhea - He has not had any fevers or chills-there is no hematemesis - At this time the etiology for his vomiting is undetermined-questionable if it was viral related initially    Hypotension with a history of essential hypertension - He has both lisinopril and losartan listed on his med rec but in his bag, he only has a bottle of lisinopril - holding both - BP 126/82 today- follow  Diabetes mellitus-type II, insulin requiring - His blood sugars are elevated in the 2-300 range-I suspect his high sugars are also contributing to his dehydration - A1c is 11.7 - He states that he has been taking his Lantus but has not been taking  his NovoLog specifically because he has not eaten in over a week - cont Lantus and SSI   - will increase Lantus today - dietician consult requested for education  Diabetic neuropathy - started Gabapentin 100 mg TID  Diabetic retinopathy - needs better control of sugars- discussed with patient  Small skin tear on right 1st toe - no bleeding or infection noted- follow   Cocaine abuse - Have counseled the patient to avoid cocaine in the future   Depression - Continue fluoxetine   Dyslipidemia - Hold Lipitor for now until GI symptoms improve   Homeless - He is currently staying in a motel which is being paid by [facility] - he is on disability   Time spent in minutes: 35 DVT prophylaxis: heparin injection 5,000 Units Start: 09/05/21 2200  Code Status: DNR Family Communication:  Level of Care: Level of care: Telemetry Disposition Plan:  Status is: Inpatient  Remains inpatient appropriate because: continue to advance diet and cont iVF  Consultants:  none Procedures:  none Antimicrobials:  Anti-infectives (From admission, onward)    None        Objective: Vitals:   09/07/21 2056 09/08/21 0443 09/08/21 0448 09/08/21 0734  BP: 116/70 123/77    Pulse: 88 67 75   Resp: 18 18    Temp: 98.6 F (37 C) 98.6 F (37 C)    TempSrc: Oral Oral    SpO2: 100%  100%   Weight:    78.9 kg  Height:  5\' 6"  (1.676 m)    Intake/Output Summary (Last 24 hours) at 09/08/2021 1146 Last data filed at 09/08/2021 0900 Gross per 24 hour  Intake 630 ml  Output 2450 ml  Net -1820 ml    Filed Weights   09/08/21 0734  Weight: 78.9 kg    Examination: General exam: Appears comfortable  HEENT: PERRLA, oral mucosa moist, no sclera icterus or thrush Respiratory system: Clear to auscultation. Respiratory effort normal. Cardiovascular system: S1 & S2 heard, RRR.   Gastrointestinal system: Abdomen soft, non-tender, nondistended. Normal bowel sounds. Central nervous system: Alert  and oriented. No focal neurological deficits. Extremities: No cyanosis, clubbing or edema Skin: No rashes or ulcers Psychiatry:  Mood & affect appropriate.     Data Reviewed: I have personally reviewed following labs and imaging studies  CBC: Recent Labs  Lab 09/05/21 1434 09/06/21 0254  WBC 11.6* 8.0  NEUTROABS 8.7*  --   HGB 12.8* 10.5*  HCT 36.7* 29.5*  MCV 81.2 80.8  PLT 325 264    Basic Metabolic Panel: Recent Labs  Lab 09/05/21 1434 09/06/21 0254 09/07/21 0440 09/08/21 0506  NA 128* 129* 131* 131*  K 4.3 3.8 4.7 4.8  CL 93* 99 101 102  CO2 25 22 24  21*  GLUCOSE 356* 369* 290* 243*  BUN 50* 45* 29* 27*  CREATININE 3.08* 2.00* 1.57* 1.34*  CALCIUM 9.3 7.6* 8.6* 8.9  PHOS 4.1  --   --   --     GFR: Estimated Creatinine Clearance: 60.8 mL/min (A) (by C-G formula based on SCr of 1.34 mg/dL (H)). Liver Function Tests: Recent Labs  Lab 09/05/21 1434  AST 10*  ALT 12  ALKPHOS 63  BILITOT 1.1  PROT 7.3  ALBUMIN 3.8    Recent Labs  Lab 09/05/21 1434  LIPASE 26    No results for input(s): AMMONIA in the last 168 hours. Coagulation Profile: No results for input(s): INR, PROTIME in the last 168 hours. Cardiac Enzymes: No results for input(s): CKTOTAL, CKMB, CKMBINDEX, TROPONINI in the last 168 hours. BNP (last 3 results) No results for input(s): PROBNP in the last 8760 hours. HbA1C: Recent Labs    09/06/21 0931  HGBA1C 11.7*    CBG: Recent Labs  Lab 09/07/21 1128 09/07/21 1658 09/07/21 2058 09/08/21 0734 09/08/21 1129  GLUCAP 174* 104* 187* 285* 173*    Lipid Profile: No results for input(s): CHOL, HDL, LDLCALC, TRIG, CHOLHDL, LDLDIRECT in the last 72 hours. Thyroid Function Tests: No results for input(s): TSH, T4TOTAL, FREET4, T3FREE, THYROIDAB in the last 72 hours. Anemia Panel: No results for input(s): VITAMINB12, FOLATE, FERRITIN, TIBC, IRON, RETICCTPCT in the last 72 hours. Urine analysis:    Component Value Date/Time    COLORURINE YELLOW 09/06/2021 0542   APPEARANCEUR CLEAR 09/06/2021 0542   LABSPEC 1.013 09/06/2021 0542   PHURINE 5.0 09/06/2021 0542   GLUCOSEU >=500 (A) 09/06/2021 0542   HGBUR NEGATIVE 09/06/2021 0542   BILIRUBINUR NEGATIVE 09/06/2021 0542   KETONESUR NEGATIVE 09/06/2021 0542   PROTEINUR NEGATIVE 09/06/2021 0542   NITRITE NEGATIVE 09/06/2021 0542   LEUKOCYTESUR NEGATIVE 09/06/2021 0542   Sepsis Labs: @LABRCNTIP (procalcitonin:4,lacticidven:4) ) Recent Results (from the past 240 hour(s))  Resp Panel by RT-PCR (Flu A&B, Covid) Nasopharyngeal Swab     Status: None   Collection Time: 09/05/21  5:31 PM   Specimen: Nasopharyngeal Swab; Nasopharyngeal(NP) swabs in vial transport medium  Result Value Ref Range Status   SARS Coronavirus 2 by RT PCR NEGATIVE NEGATIVE Final  Comment: (NOTE) SARS-CoV-2 target nucleic acids are NOT DETECTED.  The SARS-CoV-2 RNA is generally detectable in upper respiratory specimens during the acute phase of infection. The lowest concentration of SARS-CoV-2 viral copies this assay can detect is 138 copies/mL. A negative result does not preclude SARS-Cov-2 infection and should not be used as the sole basis for treatment or other patient management decisions. A negative result may occur with  improper specimen collection/handling, submission of specimen other than nasopharyngeal swab, presence of viral mutation(s) within the areas targeted by this assay, and inadequate number of viral copies(<138 copies/mL). A negative result must be combined with clinical observations, patient history, and epidemiological information. The expected result is Negative.  Fact Sheet for Patients:  BloggerCourse.com  Fact Sheet for Healthcare Providers:  SeriousBroker.it  This test is no t yet approved or cleared by the [place] FDA and  has been authorized for detection and/or diagnosis of SARS-CoV-2 by FDA under an  Emergency Use Authorization (EUA). This EUA will remain  in effect (meaning this test can be used) for the duration of the COVID-19 declaration under Section 564(b)(1) of the Act, 21 U.S.C.section 360bbb-3(b)(1), unless the authorization is terminated  or revoked sooner.       Influenza A by PCR NEGATIVE NEGATIVE Final   Influenza B by PCR NEGATIVE NEGATIVE Final    Comment: (NOTE) The Xpert Xpress SARS-CoV-2/FLU/RSV plus assay is intended as an aid in the diagnosis of influenza from Nasopharyngeal swab specimens and should not be used as a sole basis for treatment. Nasal washings and aspirates are unacceptable for Xpert Xpress SARS-CoV-2/FLU/RSV testing.  Fact Sheet for Patients: BloggerCourse.com  Fact Sheet for Healthcare Providers: SeriousBroker.it  This test is not yet approved or cleared by the [place] FDA and has been authorized for detection and/or diagnosis of SARS-CoV-2 by FDA under an Emergency Use Authorization (EUA). This EUA will remain in effect (meaning this test can be used) for the duration of the COVID-19 declaration under Section 564(b)(1) of the Act, 21 U.S.C. section 360bbb-3(b)(1), unless the authorization is terminated or revoked.  Performed at [facility], [address], [place], [place] [address]          Radiology Studies: No results found.    Scheduled Meds:  FLUoxetine  10 mg Oral Daily   gabapentin  100 mg Oral TID   heparin  5,000 Units Subcutaneous Q8H   insulin aspart  0-15 Units Subcutaneous TID WC   insulin glargine-yfgn  30 Units Subcutaneous Daily   pantoprazole  40 mg Oral Daily   sodium chloride flush  3 mL Intravenous Q12H   Continuous Infusions:  sodium chloride 75 mL/hr at 09/07/21 0836     LOS: 3 days      Calvert Cantor, MD [facility] Pager: [email or web address] 09/08/2021, 11:46 AM

## 2021-09-08 NOTE — Progress Notes (Signed)
The patient is injury-free, afebrile, alert, and oriented X 4. Vital signs were within the baseline during this shift. He com[plained of nausea, Zofran was given. He denies chest pain, SOB, vomiting, dizziness, signs or symptoms of bleeding, or acute changes during this shift. We will continue to monitor and work toward achieving the care plan goals

## 2021-09-08 NOTE — TOC Progression Note (Signed)
Transition of Care [facility]) - Progression Note   Patient Details  Name: James Chang MRN: [id number] Date of Birth: [birth year]  [facility]) CM/SW Contact  Ewing Schlein, LCSW Phone Number: 09/08/2021, 3:26 PM  Clinical Narrative: Hospitalist asked about transportation assistance for patient to go grocery shopping. Contact information added to AVS for [employer] [place] transport. Patient will have to apply for the service, but has Medicaid and can use this for medical appointments.  Expected Discharge Plan:  (TBD) Barriers to Discharge: Continued Medical Work up  Expected Discharge Plan and Services Expected Discharge Plan:  (TBD) In-house Referral: Clinical Social Work Living arrangements for the past 2 months: Hotel/Motel  Readmission Risk Interventions No flowsheet data found.

## 2021-09-08 NOTE — Plan of Care (Signed)
  Problem: Education: Goal: Knowledge of General Education information will improve Description: Including pain rating scale, medication(s)/side effects and non-pharmacologic comfort measures Outcome: Progressing   Problem: Health Behavior/Discharge Planning: Goal: Ability to manage health-related needs will improve Outcome: Not Progressing   Problem: Clinical Measurements: Goal: Ability to maintain clinical measurements within normal limits will improve Outcome: Progressing Goal: Will remain free from infection Outcome: Progressing Goal: Diagnostic test results will improve Outcome: Progressing Goal: Respiratory complications will improve Outcome: Progressing Goal: Cardiovascular complication will be avoided Outcome: Progressing   Problem: Activity: Goal: Risk for activity intolerance will decrease Outcome: Not Progressing   Problem: Nutrition: Goal: Adequate nutrition will be maintained Outcome: Progressing   Problem: Coping: Goal: Level of anxiety will decrease Outcome: Progressing

## 2021-09-09 DIAGNOSIS — E871 Hypo-osmolality and hyponatremia: Secondary | ICD-10-CM

## 2021-09-09 DIAGNOSIS — E1169 Type 2 diabetes mellitus with other specified complication: Secondary | ICD-10-CM

## 2021-09-09 DIAGNOSIS — N179 Acute kidney failure, unspecified: Secondary | ICD-10-CM | POA: Diagnosis not present

## 2021-09-09 LAB — BASIC METABOLIC PANEL
Anion gap: 6 (ref 5–15)
BUN: 29 mg/dL — ABNORMAL HIGH (ref 6–20)
CO2: 26 mmol/L (ref 22–32)
Calcium: 9 mg/dL (ref 8.9–10.3)
Chloride: 100 mmol/L (ref 98–111)
Creatinine, Ser: 1.37 mg/dL — ABNORMAL HIGH (ref 0.61–1.24)
GFR, Estimated: >60 mL/min (ref >60)
Glucose, Bld: 403 mg/dL — ABNORMAL HIGH (ref 70–99)
Potassium: 4.4 mmol/L (ref 3.5–5.1)
Sodium: 132 mmol/L — ABNORMAL LOW (ref 135–145)

## 2021-09-09 LAB — GLUCOSE, CAPILLARY
Glucose-Capillary: 317 mg/dL — ABNORMAL HIGH (ref 70–99)
Glucose-Capillary: 321 mg/dL — ABNORMAL HIGH (ref 70–99)

## 2021-09-09 MED ORDER — INSULIN GLARGINE-YFGN 100 UNIT/ML ~~LOC~~ SOLN
45.0000 [IU] | Freq: Every day | SUBCUTANEOUS | Status: DC
  Administered 2021-09-09: 45 [IU] via SUBCUTANEOUS
  Filled 2021-09-09: qty 0.45

## 2021-09-09 MED ORDER — INSULIN ASPART 100 UNIT/ML IJ SOLN
0.0000 [IU] | Freq: Three times a day (TID) | INTRAMUSCULAR | Status: DC
  Administered 2021-09-09: 15 [IU] via SUBCUTANEOUS

## 2021-09-09 MED ORDER — INSULIN ASPART 100 UNIT/ML IJ SOLN
0.0000 [IU] | Freq: Every day | INTRAMUSCULAR | Status: DC
Start: 2021-09-09 — End: 2021-09-09
  Administered 2021-09-09: 4 [IU] via SUBCUTANEOUS

## 2021-09-09 MED ORDER — GABAPENTIN 100 MG PO CAPS: 100.0000 mg | ORAL_CAPSULE | Freq: Three times a day (TID) | ORAL | 0 refills | Status: DC

## 2021-09-09 NOTE — Progress Notes (Signed)
Pt complains of feeling hot and dry, thinks his blood glucose is low. Blood glucose was 321. Advised pt to limit sugary snacks. Pt had ice cream and popsicle one hour prior. Provider notified, night time coverage ordered and given, will continue to monitor.

## 2021-09-09 NOTE — Plan of Care (Signed)

## 2021-09-09 NOTE — TOC Transition Note (Signed)
Transition of Care [facility]) - CM/SW Discharge Note   Patient Details  Name: James Chang MRN: [id number] Date of Birth: [birth year]  Transition of Care [facility]) CM/SW Contact:  Darleene Cleaver, LCSW Phone Number: 09/09/2021, 11:21 AM   Clinical Narrative:     CSW was informed that patient needed a 4 wheel walker to discharge on, however patient left before rolling walker could be delivered.  Patient informed bedside nurse he was waiting on his ride.  CSW signing off, please reconsult if social work needs arise.   Final next level of care: Home/Self Care Barriers to Discharge: Barriers Resolved   Patient Goals and CMS Choice Patient states their goals for this hospitalization and ongoing recovery are:: To return back home. CMS Medicare.gov Compare Post Acute Care list provided to:: Patient Choice offered to / list presented to : Patient  Discharge Placement                       Discharge Plan and Services In-house Referral: Clinical Social Work              DME Arranged: Dan Humphreys rolling with seat DME Agency: [employer]                  Social Determinants of Health (SDOH) Interventions     Readmission Risk Interventions No flowsheet data found.

## 2021-09-09 NOTE — Progress Notes (Signed)
Discharge instructions given, was waiting on patient ride. Instructed by Dr. Butler Denmark to administer COVID vaccine, have case management find a front wheel walker, and have dietician speak with patient prior to discharge. Patient left for discharge while nursing staff were in another patients room.

## 2021-09-09 NOTE — Progress Notes (Addendum)
Pt new IV was beeping , went in room catheter was bent and out of arm. Pt states that he does not want IV replaced because he is going home today. Provider notified. Pt has no additional concerns at this time , call bell within reach.

## 2021-09-09 NOTE — Discharge Summary (Signed)
Physician Discharge Summary  James Chang YBO:[id number] DOB: 11-Sep-1966 DOA: 09/05/2021  PCP: Oneita Hurt, No  Admit date: 09/05/2021 Discharge date: 09/09/2021  Admitted From: home  Disposition:  home   Recommendations for Outpatient Follow-up:  F/u on diabetes control  Home Health:  none   Discharge Condition:  stable   CODE STATUS:  Full code   Diet recommendation:  carb modified, heart healthy Consultations: none  Procedures/Studies: none   Discharge Diagnoses:  Principal Problem:   AKI (acute kidney injury) (HCC) Active Problems:  Intractable vomiting with nausea  Hyponatremia   Depression   HTN (hypertension)   Peripheral neuropathy   Diabetic retinopathy of right eye with macular edema associated with type 2 diabetes mellitus (HCC)   Proliferative diabetic retinopathy of left eye with macular edema associated with type 2 diabetes mellitus (HCC)   DM, type 2, not at goal [facility])   Brief Summary: James Chang is a 55 y.o. male with medical history of diabetes mellitus on insulin, hypertension, hyperlipidemia, cocaine abuse, CVA, depression and anxiety.  The patient has been frequenting the ED for this past week with complaints of nausea vomiting and abdominal pain.  Today he was going to an appointment at his PCPs office and became unconscious.  He was noted to be hypotensive with a blood pressure of 82/64 when the EMS arrived and a fluid bolus was given.  His blood glucose was noted to be 466. In the ED, he continued to complain of feeling lightheaded nauseated and generally unwell.    Hospital Course:  Active Problems: Acute renal failure-chronic kidney disease stage IIIb Hyponatremia -Creatinine has more than doubled over the past week - likely prerenal due to vomiting in the setting of chronic ARB use - have been holding losartan- treated with slow IVF   Abdominal pain nausea and vomiting/ leukocytosis - A CT scan of the abdomen and pelvis with was performed on  10/20 and was unrevealing - 2 view abdominal x-ray today is also unrevealing -He is not having any diarrhea - He has not had any fevers or chills-there is no hematemesis - At this time the etiology for his vomiting is undetermined-questionable if it was viral related initially - has resolved completely and he has been tolerating a solid diet    Hypotension with a history of essential hypertension - He has both lisinopril and losartan listed on his med rec but in his bag, he only has a bottle of lisinopril - have been holding both  Diabetes mellitus-type II, insulin requiring - His blood sugars are elevated in the 2-300 range-I suspect his high sugars are also contributing to his dehydration - A1c is 11.7 - He states that he has been taking his Lantus but has not been taking his NovoLog specifically because he has not eaten in over a week - cont Lantus and SSI     - dietician consult requested for education   Diabetic neuropathy - started Gabapentin 100 mg TID   Diabetic retinopathy - needs better control of sugars- discussed with patient   Small skin tear on right 1st toe - no bleeding or infection noted- follow   Cocaine abuse - Have counseled the patient to avoid cocaine in the future   Depression - Continue fluoxetine   Dyslipidemia - Hold Lipitor for now until GI symptoms improve   Homeless - He is currently staying in a motel which is being paid by [facility] - he is on disability   Discharge Exam: Vitals:  09/08/21 2007 09/09/21 0434  BP: 110/61 125/78  Pulse: 86 75  Resp: 17 18  Temp: 99.6 F (37.6 C) 98.2 F (36.8 C)  SpO2: 100% 99%   Vitals:   09/08/21 0734 09/08/21 1250 09/08/21 2007 09/09/21 0434  BP:  127/76 110/61 125/78  Pulse:  80 86 75  Resp:  Temp:  99.2 F (37.3 C) 99.6 F (37.6 C) 98.2 F (36.8 C)  TempSrc:  Oral Oral Oral  SpO2:  100% 100% 99%  Weight: 78.9 kg     Height:  (1.676 m)       General: Pt is alert, awake,  not in acute distress Cardiovascular: RRR, S1/S2 +, no rubs, no gallops Respiratory: CTA bilaterally, no wheezing, no rhonchi Abdominal: Soft, NT, ND, bowel sounds + Extremities: no edema, no cyanosis   Discharge Instructions  Discharge Instructions     Diet - low sodium heart healthy   Complete by: As directed    Diet Carb Modified   Complete by: As directed    Increase activity slowly   Complete by: As directed       Allergies as of 09/09/2021       Reactions   Metformin Diarrhea   Metformin And Related    Reglan [metoclopramide]         Medication List     STOP taking these medications    lisinopril 10 MG tablet Commonly known as: ZESTRIL   QUEtiapine 25 MG tablet Commonly known as: SEROQUEL       TAKE these medications    atorvastatin 40 MG tablet Commonly known as: LIPITOR Take 40 mg by mouth daily.   FLUoxetine 10 MG capsule Commonly known as: PROZAC Take 10 mg by mouth daily.   gabapentin 100 MG capsule Commonly known as: NEURONTIN Take 1 capsule (100 mg total) by mouth 3 (three) times daily.   insulin lispro 100 UNIT/ML injection Commonly known as: HUMALOG Inject 5 Units into the skin 3 (three) times daily with meals.   Lantus SoloStar 100 UNIT/ML Solostar Pen Generic drug: insulin glargine Inject 40 Units into the skin at bedtime.   losartan 50 MG tablet Commonly known as: COZAAR Take 50 mg by mouth daily.   ondansetron 8 MG disintegrating tablet Commonly known as: Zofran ODT Take 1 tablet (8 mg total) by mouth every 8 (eight) hours as needed for nausea or vomiting.   pantoprazole 40 MG tablet Commonly known as: PROTONIX Take 40 mg by mouth daily.        Follow-up Information     [facility] Follow up.   Why: Follow up as soon as you are able Contact information: [address] [place], [place] [address]  Phone: [phone]        [facility] Follow up.   Why: Call [phone] to do application for [facility]. They can assist with transportation for grocery shopping, medical appointments, etc. It costs $2.00 per trip. Contact information: Call [phone]               Allergies  Allergen Reactions   Metformin Diarrhea   Metformin And Related    Reglan [Metoclopramide]       CT Head Wo Contrast  Result Date: 09/01/2021 CLINICAL DATA:  Seizure, abdominal pain. EXAM: CT HEAD WITHOUT CONTRAST TECHNIQUE: Contiguous axial images were obtained from the base of the skull through the vertex without intravenous contrast. COMPARISON:  CT head dated 03/30/2021 FINDINGS: Brain: No evidence of  acute infarction, hemorrhage, hydrocephalus, extra-axial collection or mass lesion/mass effect. Vascular: There are vascular calcifications in the carotid siphons. Skull: Normal. Negative for fracture or focal lesion. Sinuses/Orbits: Right sphenoid sinus disease is noted. Other: None. IMPRESSION: No acute intracranial process. Electronically Signed   By: Romona Curls M.D.   On: 09/01/2021 13:58   CT ABDOMEN PELVIS W CONTRAST  Result Date: 08/30/2021 CLINICAL DATA:  55 year old male with midline abdominal pain, 3 months but progressed over the past 3 days. EXAM: CT ABDOMEN AND PELVIS WITH CONTRAST TECHNIQUE: Multidetector CT imaging of the abdomen and pelvis was performed using the standard protocol following bolus administration of intravenous contrast. CONTRAST:  8mL OMNIPAQUE IOHEXOL 350 MG/ML SOLN COMPARISON:  [facility] CT Abdomen and Pelvis 07/22/2021 and earlier. FINDINGS: Lower chest: Negative aside from mild left lung base atelectasis. Hepatobiliary: Negative liver and gallbladder. Pancreas: Negative. Spleen: Negative. Adrenals/Urinary Tract: Normal adrenal glands. Previously demonstrated punctate nephrolithiasis is less evident today with IV contrast. Kidneys enhance symmetrically and are nonobstructed. Tiny left renal midpole cyst now apparent. Symmetric  contrast excretion on the delayed images. Normal ureters. Unremarkable bladder. Stomach/Bowel: Redundant large bowel with retained stool similar to exams earlier this year. Appendix remains normal on series 2, image 59. No large bowel inflammation. Negative terminal ileum. No dilated small bowel. Stomach and duodenum are within normal limits. No free air, free fluid or mesenteric inflammation. Vascular/Lymphatic: Major arterial structures in the abdomen and pelvis appear patent with minimal atherosclerosis. Portal venous system is patent. No lymphadenopathy. Reproductive: Negative. Other: No pelvic free fluid. Musculoskeletal: Stable. No acute osseous abnormality identified. Lumbar facet degeneration. IMPRESSION: 1. No acute or inflammatory process identified in the abdomen or pelvis. 2. Redundant large bowel with retained stool similar prior CTs. 3. Mild bilateral nephrolithiasis, less apparent with IV contrast on board today. Electronically Signed   By: Odessa Fleming M.D.   On: 08/30/2021 09:02   DG Chest Port 1 View  Result Date: 08/31/2021 CLINICAL DATA:  55 year old male with abdominal pain and vomiting. EXAM: PORTABLE CHEST 1 VIEW COMPARISON:  CT Abdomen and Pelvis yesterday. FINDINGS: Portable AP semi upright view at 0842 hours. Low lung volumes. Normal cardiac size and mediastinal contours. Visualized tracheal air column is within normal limits. Allowing for portable technique the lungs are clear. No pneumothorax or pleural effusion. No acute osseous abnormality identified. IMPRESSION: Negative portable chest. Electronically Signed   By: Odessa Fleming M.D.   On: 08/31/2021 08:49   DG Chest Portable 1 View  Result Date: 08/30/2021 CLINICAL DATA:  Pt c/o midline abd pain x 3 months, worsened x 3 days. EXAM: PORTABLE CHEST - 1 VIEW COMPARISON:  06/08/2021 FINDINGS: Lungs are clear. Heart size and mediastinal contours are within normal limits. No effusion. Visualized bones unremarkable. IMPRESSION: No acute  cardiopulmonary disease. Electronically Signed   By: Corlis Leak M.D.   On: 08/30/2021 08:01   DG Abd 2 Views  Result Date: 09/05/2021 CLINICAL DATA:  Abdomen pain vomiting and nausea EXAM: ABDOMEN - 2 VIEW COMPARISON:  03/22/2020, CT 08/30/2021 FINDINGS: The bowel gas pattern is normal. There is no evidence of free air. No radio-opaque calculi or other significant radiographic abnormality is seen. Moderate to large quantity of stool in the colon. IMPRESSION: Negative. Electronically Signed   By: Jasmine Pang M.D.   On: 09/05/2021 18:18     The results of significant diagnostics from this hospitalization (including imaging, microbiology, ancillary and laboratory) are listed below for reference.  Microbiology: Recent Results (from the past 240 hour(s))  Resp Panel by RT-PCR (Flu A&B, Covid) Nasopharyngeal Swab     Status: None   Collection Time: 09/05/21  5:31 PM   Specimen: Nasopharyngeal Swab; Nasopharyngeal(NP) swabs in vial transport medium  Result Value Ref Range Status   SARS Coronavirus 2 by RT PCR NEGATIVE NEGATIVE Final    Comment: (NOTE) SARS-CoV-2 target nucleic acids are NOT DETECTED.  The SARS-CoV-2 RNA is generally detectable in upper respiratory specimens during the acute phase of infection. The lowest concentration of SARS-CoV-2 viral copies this assay can detect is 138 copies/mL. A negative result does not preclude SARS-Cov-2 infection and should not be used as the sole basis for treatment or other patient management decisions. A negative result may occur with  improper specimen collection/handling, submission of specimen other than nasopharyngeal swab, presence of viral mutation(s) within the areas targeted by this assay, and inadequate number of viral copies(<138 copies/mL). A negative result must be combined with clinical observations, patient history, and epidemiological information. The expected result is Negative.  Fact Sheet for Patients:   BloggerCourse.com  Fact Sheet for Healthcare Providers:  SeriousBroker.it  This test is no t yet approved or cleared by the [place] FDA and  has been authorized for detection and/or diagnosis of SARS-CoV-2 by FDA under an Emergency Use Authorization (EUA). This EUA will remain  in effect (meaning this test can be used) for the duration of the COVID-19 declaration under Section 564(b)(1) of the Act, 21 U.S.C.section 360bbb-3(b)(1), unless the authorization is terminated  or revoked sooner.       Influenza A by PCR NEGATIVE NEGATIVE Final   Influenza B by PCR NEGATIVE NEGATIVE Final    Comment: (NOTE) The Xpert Xpress SARS-CoV-2/FLU/RSV plus assay is intended as an aid in the diagnosis of influenza from Nasopharyngeal swab specimens and should not be used as a sole basis for treatment. Nasal washings and aspirates are unacceptable for Xpert Xpress SARS-CoV-2/FLU/RSV testing.  Fact Sheet for Patients: BloggerCourse.com  Fact Sheet for Healthcare Providers: SeriousBroker.it  This test is not yet approved or cleared by the [place] FDA and has been authorized for detection and/or diagnosis of SARS-CoV-2 by FDA under an Emergency Use Authorization (EUA). This EUA will remain in effect (meaning this test can be used) for the duration of the COVID-19 declaration under Section 564(b)(1) of the Act, 21 U.S.C. section 360bbb-3(b)(1), unless the authorization is terminated or revoked.  Performed at [facility], [address] [place], [place] [address]      Labs: BNP (last 3 results) No results for input(s): BNP in the last 8760 hours. Basic Metabolic Panel: Recent Labs  Lab 09/05/21 1434 09/06/21 0254 09/07/21 0440 09/08/21 0506 09/09/21 0804  NA 128* 129* 131* 131* 132*  K 4.3 3.8 4.7 4.8 4.4  CL 93* 99 101 102 100  CO2 25 22 24  21* 26   GLUCOSE 356* 369* 290* 243* 403*  BUN 50* 45* 29* 27* 29*  CREATININE 3.08* 2.00* 1.57* 1.34* 1.37*  CALCIUM 9.3 7.6* 8.6* 8.9 9.0  PHOS 4.1  --   --   --   --    Liver Function Tests: Recent Labs  Lab 09/05/21 1434  AST 10*  ALT 12  ALKPHOS 63  BILITOT 1.1  PROT 7.3  ALBUMIN 3.8   Recent Labs  Lab 09/05/21 1434  LIPASE 26   No results for input(s): AMMONIA in the last 168 hours. CBC: Recent Labs  Lab 09/05/21  1434 09/06/21 0254  WBC 11.6* 8.0  NEUTROABS 8.7*  --   HGB 12.8* 10.5*  HCT 36.7* 29.5*  MCV 81.2 80.8  PLT 325 264   Cardiac Enzymes: No results for input(s): CKTOTAL, CKMB, CKMBINDEX, TROPONINI in the last 168 hours. BNP: Invalid input(s): POCBNP CBG: Recent Labs  Lab 09/08/21 1129 09/08/21 1716 09/08/21 2158 09/09/21 0027 09/09/21 0720  GLUCAP 173* 304* 277* 321* 317*   D-Dimer No results for input(s): DDIMER in the last 72 hours. Hgb A1c No results for input(s): HGBA1C in the last 72 hours. Lipid Profile No results for input(s): CHOL, HDL, LDLCALC, TRIG, CHOLHDL, LDLDIRECT in the last 72 hours. Thyroid function studies No results for input(s): TSH, T4TOTAL, T3FREE, THYROIDAB in the last 72 hours.  Invalid input(s): FREET3 Anemia work up No results for input(s): VITAMINB12, FOLATE, FERRITIN, TIBC, IRON, RETICCTPCT in the last 72 hours. Urinalysis    Component Value Date/Time   COLORURINE YELLOW 09/06/2021 0542   APPEARANCEUR CLEAR 09/06/2021 0542   LABSPEC 1.013 09/06/2021 0542   PHURINE 5.0 09/06/2021 0542   GLUCOSEU >=500 (A) 09/06/2021 0542   HGBUR NEGATIVE 09/06/2021 0542   BILIRUBINUR NEGATIVE 09/06/2021 0542   KETONESUR NEGATIVE 09/06/2021 0542   PROTEINUR NEGATIVE 09/06/2021 0542   NITRITE NEGATIVE 09/06/2021 0542   LEUKOCYTESUR NEGATIVE 09/06/2021 0542   Sepsis Labs Invalid input(s): PROCALCITONIN,  WBC,  LACTICIDVEN Microbiology Recent Results (from the past 240 hour(s))  Resp Panel by RT-PCR (Flu A&B, Covid)  Nasopharyngeal Swab     Status: None   Collection Time: 09/05/21  5:31 PM   Specimen: Nasopharyngeal Swab; Nasopharyngeal(NP) swabs in vial transport medium  Result Value Ref Range Status   SARS Coronavirus 2 by RT PCR NEGATIVE NEGATIVE Final    Comment: (NOTE) SARS-CoV-2 target nucleic acids are NOT DETECTED.  The SARS-CoV-2 RNA is generally detectable in upper respiratory specimens during the acute phase of infection. The lowest concentration of SARS-CoV-2 viral copies this assay can detect is 138 copies/mL. A negative result does not preclude SARS-Cov-2 infection and should not be used as the sole basis for treatment or other patient management decisions. A negative result may occur with  improper specimen collection/handling, submission of specimen other than nasopharyngeal swab, presence of viral mutation(s) within the areas targeted by this assay, and inadequate number of viral copies(<138 copies/mL). A negative result must be combined with clinical observations, patient history, and epidemiological information. The expected result is Negative.  Fact Sheet for Patients:  BloggerCourse.com  Fact Sheet for Healthcare Providers:  SeriousBroker.it  This test is no t yet approved or cleared by the [place] FDA and  has been authorized for detection and/or diagnosis of SARS-CoV-2 by FDA under an Emergency Use Authorization (EUA). This EUA will remain  in effect (meaning this test can be used) for the duration of the COVID-19 declaration under Section 564(b)(1) of the Act, 21 U.S.C.section 360bbb-3(b)(1), unless the authorization is terminated  or revoked sooner.       Influenza A by PCR NEGATIVE NEGATIVE Final   Influenza B by PCR NEGATIVE NEGATIVE Final    Comment: (NOTE) The Xpert Xpress SARS-CoV-2/FLU/RSV plus assay is intended as an aid in the diagnosis of influenza from Nasopharyngeal swab specimens and should not be  used as a sole basis for treatment. Nasal washings and aspirates are unacceptable for Xpert Xpress SARS-CoV-2/FLU/RSV testing.  Fact Sheet for Patients: BloggerCourse.com  Fact Sheet for Healthcare Providers: SeriousBroker.it  This test is not yet approved or cleared by the  [place] [employer] and has been authorized for detection and/or diagnosis of SARS-CoV-2 by FDA under an TEFL teacher (EUA). This EUA will remain in effect (meaning this test can be used) for the duration of the COVID-19 declaration under Section 564(b)(1) of the Act, 21 U.S.C. section 360bbb-3(b)(1), unless the authorization is terminated or revoked.  Performed at [facility], [address], [place], [place] [address]      Time coordinating discharge in minutes: 65  SIGNED:   Calvert Cantor, MD  [facility] 09/09/2021, 4:04 PM

## 2021-09-09 NOTE — Progress Notes (Signed)
Went in pt's room noticed that IV pump was turned off. Pt states that he turns off IV pump when it starts to beep. Advised pt to call nurse if IV starts beeping and not to turn off his self. Noticed catheter tubing was bent and out of hand, had to remove. Notified provider Margo Aye to see if IV can stay out because pt will be d/c in a/m and tolerating food and fluids,  was told to defer to morning team. IV consult placed, pt agreeable.

## 2021-09-10 NOTE — TOC Progression Note (Signed)
Transition of Care [facility]) - Progression Note    Patient Details  Name: James Chang MRN: [id number] Date of Birth: 03/28/1966  Transition of [facility]) CM/SW Contact  Ida Rogue, [place] Phone Number: 09/10/2021, 4:12 PM  Clinical Narrative:   Received call from patient stating he did not get his rollator prior to d/c, wanted to know if he could still get it. MD had ordered rolling walker, and she confirmed she is not able to order post d/c.  I called [name] to let him know I cannot get it for him,.  When he goes to see his PCP at [facility], I told him to ask them to help with orders and delivery. I voiced understanding. TOC sign off.    Expected Discharge Plan:  (TBD) Barriers to Discharge: Barriers Resolved  Expected Discharge Plan and Services Expected Discharge Plan:  (TBD) In-house Referral: Clinical Social Work     Living arrangements for the past 2 months: Hotel/Motel Expected Discharge Date: 09/09/21               DME Arranged: Dan Humphreys rolling with seat DME Agency: [employer]                   Social Determinants of Health (SDOH) Interventions    Readmission Risk Interventions No flowsheet data found.

## 2021-10-15 ENCOUNTER — Other Ambulatory Visit: Payer: Self-pay

## 2021-10-15 DIAGNOSIS — I739 Peripheral vascular disease, unspecified: Secondary | ICD-10-CM

## 2021-10-23 ENCOUNTER — Ambulatory Visit (INDEPENDENT_AMBULATORY_CARE_PROVIDER_SITE_OTHER): Payer: Medicare Other | Admitting: Vascular Surgery

## 2021-10-23 ENCOUNTER — Ambulatory Visit (HOSPITAL_COMMUNITY)
Admission: RE | Admit: 2021-10-23 | Discharge: 2021-10-23 | Disposition: A | Discharge status: Home / Self Care | Payer: Medicare Other | Source: Ambulatory Visit | Attending: Vascular Surgery | Admitting: Vascular Surgery

## 2021-10-23 ENCOUNTER — Other Ambulatory Visit: Payer: Self-pay

## 2021-10-23 ENCOUNTER — Encounter: Payer: Self-pay | Admitting: Vascular Surgery

## 2021-10-23 DIAGNOSIS — M79671 Pain in right foot: Secondary | ICD-10-CM | POA: Diagnosis not present

## 2021-10-23 DIAGNOSIS — M79673 Pain in unspecified foot: Secondary | ICD-10-CM | POA: Insufficient documentation

## 2021-10-23 DIAGNOSIS — M79672 Pain in left foot: Secondary | ICD-10-CM | POA: Diagnosis not present

## 2021-10-23 DIAGNOSIS — I739 Peripheral vascular disease, unspecified: Secondary | ICD-10-CM

## 2021-10-23 NOTE — Progress Notes (Signed)
Patient name: James Chang MRN: [id number] DOB: 11/05/1966 Sex: male  REASON FOR CONSULT: Evaluate peripheral arterial disease  HPI: James Chang is a 55 y.o. male, with history of hypertension, hyperlipidemia, diabetes that presents for evaluation of peripheral arterial disease.  Patient states he has pain and numbness and tingling in both feet that has been ongoing for years.  He states he has been diagnosed with neuropathy and has tried Lyrica and gabapentin but this makes him very drowsy.  He states he does not smoke.  No calf pain with ambulation.  Referred by PCP for evaluation of PAD.  Past Medical History:  Diagnosis Date   Depression    Diabetes mellitus without complication (HCC)    History of migraine    Hyperlipidemia    Hypertension    Personal history of drug abuse (HCC)    Stroke (HCC) 2008    Past Surgical History:  Procedure Laterality Date   NO PAST SURGERIES      Family History  Problem Relation Age of Onset   Arthritis Mother    Stroke Father    Hypertension Father    Kidney disease Father    Heart attack Father    Cancer Neg Hx    Diabetes Neg Hx     SOCIAL HISTORY: Social History   Socioeconomic History   Marital status: Single    Spouse name: Not on file   Number of children: 3   Years of education: Not on file   Highest education level: Not on file  Occupational History   Not on file  Tobacco Use   Smoking status: Never   Smokeless tobacco: Never  Vaping Use   Vaping Use: Never used  Substance and Sexual Activity   Alcohol use: Yes    Comment: seldom 1-2 times a month beer   Drug use: Yes    Types: Cocaine    Comment: "a couple days ago"   Sexual activity: Not on file  Other Topics Concern   Not on file  Social History Narrative   Disabled truck driver- since stroke.   Has 17 year old mother who is frail- he is now staying with her.   3 children- youngest is 29.   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         Social Determinants of Health   Financial Resource Strain: Not on file  Food Insecurity: Not on file  Transportation Needs: Not on file  Physical Activity: Not on file  Stress: Not on file  Social Connections: Not on file  Intimate Partner Violence: Not on file    Allergies  Allergen Reactions   Metformin Diarrhea   Metformin And Related    Reglan [Metoclopramide]     Current Outpatient Medications  Medication Sig Dispense Refill   atorvastatin (LIPITOR) 40 MG tablet Take 40 mg by mouth daily.     FLUoxetine (PROZAC) 10 MG capsule Take 10 mg by mouth daily.     insulin glargine (LANTUS SOLOSTAR) 100 UNIT/ML Solostar Pen Inject 40 Units into the skin at bedtime.     losartan (COZAAR) 50 MG tablet Take 50 mg by mouth daily.     ondansetron (ZOFRAN ODT) 8 MG disintegrating tablet Take 1 tablet (8 mg total) by mouth every 8 (eight) hours as needed for nausea or vomiting. 20 tablet 0   pantoprazole (PROTONIX) 40 MG tablet Take 40  mg by mouth daily.     gabapentin (NEURONTIN) 100 MG capsule Take 1 capsule (100 mg total) by mouth 3 (three) times daily. (Patient not taking: Reported on 10/23/2021) 90 capsule 0   insulin lispro (HUMALOG) 100 UNIT/ML injection Inject 5 Units into the skin 3 (three) times daily with meals. (Patient not taking: Reported on 10/23/2021)     No current facility-administered medications for this visit.    REVIEW OF SYSTEMS:  [X]  denotes positive finding, [ ]  denotes negative finding Cardiac  Comments:  Chest pain or chest pressure:    Shortness of breath upon exertion:    Short of breath when lying flat:    Irregular heart rhythm:        Vascular    Pain in calf, thigh, or hip brought on by ambulation:    Pain in feet at night that wakes you up from your sleep:     Blood clot in your veins:    Leg swelling:         Pulmonary    Oxygen at home:    Productive cough:     Wheezing:          Neurologic    Sudden weakness in arms or legs:     Sudden numbness in arms or legs:     Sudden onset of difficulty speaking or slurred speech:    Temporary loss of vision in one eye:     Problems with dizziness:         Gastrointestinal    Blood in stool:     Vomited blood:         Genitourinary    Burning when urinating:     Blood in urine:        Psychiatric    Major depression:         Hematologic    Bleeding problems:    Problems with blood clotting too easily:        Skin    Rashes or ulcers:        Constitutional    Fever or chills:      PHYSICAL EXAM: Vitals:   10/23/21 0843  BP: (!) 158/88  Pulse: 74  Resp: 18  Temp: 97.6 F (36.4 C)  TempSrc: Temporal  SpO2: 98%  Weight: 190 lb (86.2 kg)  Height: 5\' 6"  (1.676 m)    GENERAL: The patient is a well-nourished male, in no acute distress. The vital signs are documented above. CARDIAC: There is a regular rate and rhythm.  VASCULAR:  Palpable femoral pulses bilaterally Palpable DP and PT pulses bilaterally No lower extremity tissue loss PULMONARY: No respiratory distress. ABDOMEN: Soft and non-tender. MUSCULOSKELETAL: There are no major deformities or cyanosis. NEUROLOGIC: No focal weakness or paresthesias are detected. SKIN: There are no ulcers or rashes noted. PSYCHIATRIC: The patient has a normal affect.  DATA:   ABIs today are 1.18 on the right triphasic and 1.18 on the left triphasic  Assessment/Plan:  55 year old male presents for evaluation of peripheral arterial disease.  As I discussed with him today he has easily palpable dorsalis pedis and posterior tibial pulses bilaterally and has normal ABIs above 1 with triphasic waveforms at the ankle.  No indication of peripheral arterial disease.  I discussed that I do not think his symptoms are related to peripheral arterial disease and this sounds much more like neuropathy.  He does not need any intervention or further follow-up from my  standpoint.   Marty Heck, MD  [facility] Office: [phone]

## 2021-12-15 ENCOUNTER — Emergency Department (HOSPITAL_COMMUNITY)
Admission: EM | Admit: 2021-12-15 | Discharge: 2021-12-15 | Disposition: A | Discharge status: Home / Self Care | Payer: Medicare Other | Attending: Emergency Medicine | Admitting: Emergency Medicine

## 2021-12-15 DIAGNOSIS — Z79899 Other long term (current) drug therapy: Secondary | ICD-10-CM | POA: Diagnosis not present

## 2021-12-15 DIAGNOSIS — R739 Hyperglycemia, unspecified: Secondary | ICD-10-CM | POA: Insufficient documentation

## 2021-12-15 DIAGNOSIS — Z794 Long term (current) use of insulin: Secondary | ICD-10-CM | POA: Insufficient documentation

## 2021-12-15 LAB — CBG MONITORING, ED
Glucose-Capillary: 419 mg/dL — ABNORMAL HIGH (ref 70–99)
Glucose-Capillary: 430 mg/dL — ABNORMAL HIGH (ref 70–99)

## 2021-12-15 MED ORDER — INSULIN GLARGINE-YFGN 100 UNIT/ML ~~LOC~~ SOLN
40.0000 [IU] | Freq: Once | SUBCUTANEOUS | Status: AC
  Administered 2021-12-15: 40 [IU] via SUBCUTANEOUS
  Filled 2021-12-15: qty 0.4

## 2021-12-15 NOTE — Discharge Instructions (Signed)
Return if any problems.  You have had your evening insulin dosage

## 2021-12-15 NOTE — ED Notes (Signed)
Pt given sandwich and crackers and water
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
## 2021-12-15 NOTE — ED Notes (Signed)
RN reviewed discharge and follow up w/ pt, no further questions. [employer] contacted regarding pt's discharge

## 2021-12-23 ENCOUNTER — Emergency Department (HOSPITAL_COMMUNITY)
Admission: EM | Admit: 2021-12-23 | Discharge: 2021-12-24 | Disposition: A | Discharge status: Home / Self Care | Payer: Medicare Other | Attending: Emergency Medicine | Admitting: Emergency Medicine

## 2021-12-23 DIAGNOSIS — R1012 Left upper quadrant pain: Secondary | ICD-10-CM | POA: Diagnosis not present

## 2021-12-23 DIAGNOSIS — I10 Essential (primary) hypertension: Secondary | ICD-10-CM | POA: Insufficient documentation

## 2021-12-23 DIAGNOSIS — R6883 Chills (without fever): Secondary | ICD-10-CM | POA: Diagnosis not present

## 2021-12-23 DIAGNOSIS — R258 Other abnormal involuntary movements: Secondary | ICD-10-CM | POA: Insufficient documentation

## 2021-12-23 DIAGNOSIS — R1013 Epigastric pain: Secondary | ICD-10-CM | POA: Insufficient documentation

## 2021-12-23 DIAGNOSIS — E1165 Type 2 diabetes mellitus with hyperglycemia: Secondary | ICD-10-CM | POA: Diagnosis not present

## 2021-12-23 DIAGNOSIS — Z794 Long term (current) use of insulin: Secondary | ICD-10-CM | POA: Insufficient documentation

## 2021-12-23 DIAGNOSIS — R112 Nausea with vomiting, unspecified: Secondary | ICD-10-CM | POA: Insufficient documentation

## 2021-12-23 DIAGNOSIS — Z20822 Contact with and (suspected) exposure to covid-19: Secondary | ICD-10-CM | POA: Diagnosis not present

## 2021-12-23 DIAGNOSIS — K5900 Constipation, unspecified: Secondary | ICD-10-CM | POA: Insufficient documentation

## 2021-12-23 DIAGNOSIS — R072 Precordial pain: Secondary | ICD-10-CM | POA: Diagnosis not present

## 2021-12-23 DIAGNOSIS — Z79899 Other long term (current) drug therapy: Secondary | ICD-10-CM | POA: Diagnosis not present

## 2021-12-23 LAB — CBC WITH DIFFERENTIAL/PLATELET
Abs Immature Granulocytes: 0.04 10*3/uL (ref 0.00–0.07)
Basophils Absolute: 0 10*3/uL (ref 0.0–0.1)
Basophils Relative: 0 %
Eosinophils Absolute: 0.1 10*3/uL (ref 0.0–0.5)
Eosinophils Relative: 1 %
HCT: 38.9 % — ABNORMAL LOW (ref 39.0–52.0)
Hemoglobin: 13.7 g/dL (ref 13.0–17.0)
Immature Granulocytes: 0 %
Lymphocytes Relative: 13 %
Lymphs Abs: 1.4 10*3/uL (ref 0.7–4.0)
MCH: 28.4 pg (ref 26.0–34.0)
MCHC: 35.2 g/dL (ref 30.0–36.0)
MCV: 80.5 fL (ref 80.0–100.0)
Monocytes Absolute: 0.4 10*3/uL (ref 0.1–1.0)
Monocytes Relative: 4 %
Neutro Abs: 8.9 10*3/uL — ABNORMAL HIGH (ref 1.7–7.7)
Neutrophils Relative %: 82 %
Platelets: 254 10*3/uL (ref 150–400)
RBC: 4.83 MIL/uL (ref 4.22–5.81)
RDW: 12.2 % (ref 11.5–15.5)
WBC: 10.8 10*3/uL — ABNORMAL HIGH (ref 4.0–10.5)
nRBC: 0 % (ref 0.0–0.2)

## 2021-12-23 LAB — I-STAT VENOUS BLOOD GAS, ED
Acid-Base Excess: 2 mmol/L (ref 0.0–2.0)
Bicarbonate: 24.7 mmol/L (ref 20.0–28.0)
Calcium, Ion: 1.08 mmol/L — ABNORMAL LOW (ref 1.15–1.40)
HCT: 40 % (ref 39.0–52.0)
Hemoglobin: 13.6 g/dL (ref 13.0–17.0)
O2 Saturation: 92 %
Potassium: 4 mmol/L (ref 3.5–5.1)
Sodium: 134 mmol/L — ABNORMAL LOW (ref 135–145)
TCO2: 26 mmol/L (ref 22–32)
pCO2, Ven: 31.2 mmHg — ABNORMAL LOW (ref 44.0–60.0)
pH, Ven: 7.506 — ABNORMAL HIGH (ref 7.250–7.430)
pO2, Ven: 58 mmHg — ABNORMAL HIGH (ref 32.0–45.0)

## 2021-12-23 LAB — COMPREHENSIVE METABOLIC PANEL
ALT: 19 U/L (ref 0–44)
AST: 17 U/L (ref 15–41)
Albumin: 3.9 g/dL (ref 3.5–5.0)
Alkaline Phosphatase: 76 U/L (ref 38–126)
Anion gap: 13 (ref 5–15)
BUN: 22 mg/dL — ABNORMAL HIGH (ref 6–20)
CO2: 22 mmol/L (ref 22–32)
Calcium: 9.6 mg/dL (ref 8.9–10.3)
Chloride: 97 mmol/L — ABNORMAL LOW (ref 98–111)
Creatinine, Ser: 1.48 mg/dL — ABNORMAL HIGH (ref 0.61–1.24)
GFR, Estimated: 55 mL/min — ABNORMAL LOW (ref >60)
Glucose, Bld: 312 mg/dL — ABNORMAL HIGH (ref 70–99)
Potassium: 4 mmol/L (ref 3.5–5.1)
Sodium: 132 mmol/L — ABNORMAL LOW (ref 135–145)
Total Bilirubin: 0.8 mg/dL (ref 0.3–1.2)
Total Protein: 7.1 g/dL (ref 6.5–8.1)

## 2021-12-23 LAB — CBG MONITORING, ED: Glucose-Capillary: 325 mg/dL — ABNORMAL HIGH (ref 70–99)

## 2021-12-23 LAB — LIPASE, BLOOD: Lipase: 26 U/L (ref 11–51)

## 2021-12-23 MED ORDER — ONDANSETRON HCL 4 MG/2ML IJ SOLN
4.0000 mg | Freq: Once | INTRAMUSCULAR | Status: AC
  Administered 2021-12-23: 4 mg via INTRAVENOUS
  Filled 2021-12-23: qty 2

## 2021-12-23 MED ORDER — LACTATED RINGERS IV BOLUS
1000.0000 mL | Freq: Once | INTRAVENOUS | Status: AC
  Administered 2021-12-23: 1000 mL via INTRAVENOUS

## 2021-12-23 MED ORDER — FENTANYL CITRATE PF 50 MCG/ML IJ SOSY
50.0000 ug | PREFILLED_SYRINGE | Freq: Once | INTRAMUSCULAR | Status: AC
  Administered 2021-12-23: 50 ug via INTRAVENOUS
  Filled 2021-12-23: qty 1

## 2021-12-23 NOTE — ED Triage Notes (Signed)
Pt brought in via EMS with c/o hyperglycemia and n/v for 2 days. Pt also endorses h/a. Pt BGL via EMS was 400.

## 2021-12-23 NOTE — ED Provider Notes (Signed)
[facility] Provider [facility]   CSN: [id number] Arrival date & time: 12/23/21  2220     History  Chief Complaint  Patient presents with   Hyperglycemia   Emesis    James Chang is a 56 y.o. male with a past medical history of insulin-dependent diabetes who presents emergency department with chief complaint of left upper quadrant and epigastric abdominal pain.  He had onset of pain 2 days ago.  He has had persistent, constant, aching pain in that region that does not radiate.  He has had similar pain in the past but is not sure what to attribute this to.  He has had shaking chills but denies fevers,  He has had associated nausea and several episodes of vomiting.  He has not made a bowel movement in the past 2 days.  He has been urinating and passing gas.  Review of EMR shows that the patient has a history of gastroparesis.  He denies alcohol abuse.     Hyperglycemia Associated symptoms: vomiting   Emesis     Home Medications Prior to Admission medications   Medication Sig Start Date End Date Taking? Authorizing Provider  atorvastatin (LIPITOR) 40 MG tablet Take 40 mg by mouth daily.    [provider]  FLUoxetine (PROZAC) 10 MG capsule Take 10 mg by mouth daily. 06/29/21   [provider]  gabapentin (NEURONTIN) 100 MG capsule Take 1 capsule (100 mg total) by mouth 3 (three) times daily. Patient not taking: Reported on 10/23/2021 09/09/21   Calvert Cantorizwan, Saima, MD  insulin glargine (LANTUS SOLOSTAR) 100 UNIT/ML Solostar Pen Inject 40 Units into the skin at bedtime. 04/24/18   [provider]  insulin lispro (HUMALOG) 100 UNIT/ML injection Inject 5 Units into the skin 3 (three) times daily with meals. Patient not taking: Reported on 10/23/2021 08/06/21   [provider]  losartan (COZAAR) 50 MG tablet Take 50 mg by mouth daily. 05/23/21   [provider]  ondansetron (ZOFRAN ODT) 8 MG disintegrating tablet  Take 1 tablet (8 mg total) by mouth every 8 (eight) hours as needed for nausea or vomiting. 08/31/21   Margarita Grizzleay, Danielle, MD  pantoprazole (PROTONIX) 40 MG tablet Take 40 mg by mouth daily. 08/08/20   [provider]      Allergies    Metformin, Metformin and related, and Reglan [metoclopramide]    Review of Systems   Review of Systems  Gastrointestinal:  Positive for vomiting.   Physical Exam Updated Vital Signs BP 121/73 (BP Location: Right Arm)    Pulse 99    Temp 99 F (37.2 C) (Oral)    Resp 18    Ht 5\' 6"  (1.676 m)    Wt 83.5 kg    SpO2 100%    BMI 29.70 kg/m  Physical Exam Vitals and nursing note reviewed.  Constitutional:      General: He is not in acute distress.    Appearance: He is well-developed. He is not diaphoretic.  HENT:     Head: Normocephalic and atraumatic.  Eyes:     General: No scleral icterus.    Extraocular Movements: Extraocular movements intact.     Conjunctiva/sclera: Conjunctivae normal.     Pupils: Pupils are equal, round, and reactive to light.  Cardiovascular:     Rate and Rhythm: Normal rate and regular rhythm.     Heart sounds: Normal heart sounds.  Pulmonary:     Effort: Pulmonary effort is normal. No respiratory  distress.     Breath sounds: Normal breath sounds.  Abdominal:     Palpations: Abdomen is soft.     Tenderness: There is abdominal tenderness in the left upper quadrant.    Musculoskeletal:     Cervical back: Normal range of motion and neck supple.  Skin:    General: Skin is warm and dry.  Neurological:     Mental Status: He is alert.  Psychiatric:        Behavior: Behavior normal.    ED Results / Procedures / Treatments   Labs (all labs ordered are listed, but only abnormal results are displayed) Labs Reviewed  CBC WITH DIFFERENTIAL/PLATELET - Abnormal; Notable for the following components:      Result Value   WBC 10.8 (*)    HCT 38.9 (*)    Neutro Abs 8.9 (*)    All other components within normal limits   COMPREHENSIVE METABOLIC PANEL - Abnormal; Notable for the following components:   Sodium 132 (*)    Chloride 97 (*)    Glucose, Bld 312 (*)    BUN 22 (*)    Creatinine, Ser 1.48 (*)    GFR, Estimated 55 (*)    All other components within normal limits  CBG MONITORING, ED - Abnormal; Notable for the following components:   Glucose-Capillary 325 (*)    All other components within normal limits  I-STAT VENOUS BLOOD GAS, ED - Abnormal; Notable for the following components:   pH, Ven 7.506 (*)    pCO2, Ven 31.2 (*)    pO2, Ven 58.0 (*)    Sodium 134 (*)    Calcium, Ion 1.08 (*)    All other components within normal limits  LIPASE, BLOOD    EKG None  Radiology No results found.  Procedures Procedures    Medications Ordered in ED Medications  lactated ringers bolus 1,000 mL (has no administration in time range)  ondansetron (ZOFRAN) injection 4 mg (has no administration in time range)    ED Course/ Medical Decision Making/ A&P Clinical Course as of 12/24/21 0458  Sun Dec 23, 2021  2332 pH, Ven(!): [id number] Metabolic alkalosis possibly from vomiting [AH]  2333 Glucose-Capillary(!): 325 [AH]  2333 Anion gap: 13 No anion gap acidosis  [AH]  2334 Creatinine(!): 1.48 Creatinine at baseline [AH]  2334 Lipase, blood Normal lipase [AH]  Mon Dec 24, 2021  0030 Reevaluated.  He has not vomited since he got the antiemetic.  He still feels somewhat nauseated.  Abdomen is now more tender with guarding.  Plan for CT scan of the abdomen [AH]  0225 At dc patient began vomiting again. Will try another antiemetic [AH]  0227 Ekg shows sinus tachycardia at a rate of 103. Normal qt length will give droperidol [AH]  [phone] Patient abruptly called the nurse and states that his ride is outside he is feeling better and ready to leave. [AH]    Clinical Course User Index [AH] Margarita Mail, PA-C                           Medical Decision Making Patient here with epigastric abdominal pain  and vomiting.Differential diagnosis of epigastric pain includes: Functional or nonulcer dyspepsia ( PUD, GERD, Gastritis, (NSAIDs, alcohol, stress, H. pylori, pernicious anemia), pancreatitis or pancreatic cancer, overeating indigestion (high-fat foods, coffee), drugs (aspirin, antibiotics (eg, macrolides, metronidazole), corticosteroids, digoxin, narcotics, theophylline), gastroparesis, lactose intolerance, malabsorption, gastric cancer, parasitic infection,cholelithiasis, choledocholithiasis, or cholangitis, ACS, pericarditis,  pneumonia, abdominal hernia, pregnancy, intestinal ischemia, esophageal rupture, gastric volvulus, hepatitis. Patient has a history of gastroparesis.  Patient moderate moderately hyperglycemic without DKA signs.  Patient given fluids, several rounds of antiemetics with resolution of his pain and vomiting.  He was tolerating p.o. fluids and asked for discharge.  Amount and/or Complexity of Data Reviewed Labs: ordered. Decision-making details documented in ED Course. Radiology: ordered and independent interpretation performed.    Details: CT abdomen pelvis ordered.  I interpreted the images.  No evidence of acute abnormality ECG/medicine tests: ordered and independent interpretation performed. Decision-making details documented in ED Course.  Risk Prescription drug management.   Final Clinical Impression(s) / ED Diagnoses Final diagnoses:  None    Rx / DC Orders ED Discharge Orders     None         Margarita Mail, PA-C 12/24/21 0502    Orpah Greek, MD 12/25/21 [phone]

## 2021-12-24 ENCOUNTER — Emergency Department (HOSPITAL_COMMUNITY): Payer: Medicare Other

## 2021-12-24 ENCOUNTER — Other Ambulatory Visit: Payer: Self-pay

## 2021-12-24 ENCOUNTER — Emergency Department (HOSPITAL_COMMUNITY)
Admission: EM | Admit: 2021-12-24 | Discharge: 2021-12-24 | Disposition: A | Discharge status: Home / Self Care | Payer: Medicare Other | Source: Home / Self Care | Attending: Emergency Medicine | Admitting: Emergency Medicine

## 2021-12-24 DIAGNOSIS — Z20822 Contact with and (suspected) exposure to covid-19: Secondary | ICD-10-CM | POA: Insufficient documentation

## 2021-12-24 DIAGNOSIS — I10 Essential (primary) hypertension: Secondary | ICD-10-CM | POA: Insufficient documentation

## 2021-12-24 DIAGNOSIS — K5900 Constipation, unspecified: Secondary | ICD-10-CM | POA: Insufficient documentation

## 2021-12-24 DIAGNOSIS — R072 Precordial pain: Secondary | ICD-10-CM | POA: Insufficient documentation

## 2021-12-24 DIAGNOSIS — E1165 Type 2 diabetes mellitus with hyperglycemia: Secondary | ICD-10-CM | POA: Diagnosis not present

## 2021-12-24 DIAGNOSIS — R1013 Epigastric pain: Secondary | ICD-10-CM | POA: Insufficient documentation

## 2021-12-24 DIAGNOSIS — Z794 Long term (current) use of insulin: Secondary | ICD-10-CM | POA: Insufficient documentation

## 2021-12-24 DIAGNOSIS — R112 Nausea with vomiting, unspecified: Secondary | ICD-10-CM | POA: Insufficient documentation

## 2021-12-24 DIAGNOSIS — Z79899 Other long term (current) drug therapy: Secondary | ICD-10-CM | POA: Insufficient documentation

## 2021-12-24 DIAGNOSIS — R0789 Other chest pain: Secondary | ICD-10-CM

## 2021-12-24 DIAGNOSIS — E119 Type 2 diabetes mellitus without complications: Secondary | ICD-10-CM | POA: Insufficient documentation

## 2021-12-24 LAB — CBC WITH DIFFERENTIAL/PLATELET
Abs Immature Granulocytes: 0.04 10*3/uL (ref 0.00–0.07)
Basophils Absolute: 0 10*3/uL (ref 0.0–0.1)
Basophils Relative: 0 %
Eosinophils Absolute: 0 10*3/uL (ref 0.0–0.5)
Eosinophils Relative: 0 %
HCT: 34.5 % — ABNORMAL LOW (ref 39.0–52.0)
Hemoglobin: 12.1 g/dL — ABNORMAL LOW (ref 13.0–17.0)
Immature Granulocytes: 1 %
Lymphocytes Relative: 13 %
Lymphs Abs: 1.1 10*3/uL (ref 0.7–4.0)
MCH: 28.4 pg (ref 26.0–34.0)
MCHC: 35.1 g/dL (ref 30.0–36.0)
MCV: 81 fL (ref 80.0–100.0)
Monocytes Absolute: 0.3 10*3/uL (ref 0.1–1.0)
Monocytes Relative: 4 %
Neutro Abs: 7 10*3/uL (ref 1.7–7.7)
Neutrophils Relative %: 82 %
Platelets: 312 10*3/uL (ref 150–400)
RBC: 4.26 MIL/uL (ref 4.22–5.81)
RDW: 12.3 % (ref 11.5–15.5)
WBC: 8.4 10*3/uL (ref 4.0–10.5)
nRBC: 0 % (ref 0.0–0.2)

## 2021-12-24 LAB — COMPREHENSIVE METABOLIC PANEL
ALT: 18 U/L (ref 0–44)
AST: 14 U/L — ABNORMAL LOW (ref 15–41)
Albumin: 3.6 g/dL (ref 3.5–5.0)
Alkaline Phosphatase: 72 U/L (ref 38–126)
Anion gap: 11 (ref 5–15)
BUN: 18 mg/dL (ref 6–20)
CO2: 24 mmol/L (ref 22–32)
Calcium: 9.3 mg/dL (ref 8.9–10.3)
Chloride: 99 mmol/L (ref 98–111)
Creatinine, Ser: 1.6 mg/dL — ABNORMAL HIGH (ref 0.61–1.24)
GFR, Estimated: 50 mL/min — ABNORMAL LOW (ref >60)
Glucose, Bld: 356 mg/dL — ABNORMAL HIGH (ref 70–99)
Potassium: 3.7 mmol/L (ref 3.5–5.1)
Sodium: 134 mmol/L — ABNORMAL LOW (ref 135–145)
Total Bilirubin: 1.1 mg/dL (ref 0.3–1.2)
Total Protein: 6.6 g/dL (ref 6.5–8.1)

## 2021-12-24 LAB — RESP PANEL BY RT-PCR (FLU A&B, COVID) ARPGX2
Influenza A by PCR: NEGATIVE
Influenza B by PCR: NEGATIVE
SARS Coronavirus 2 by RT PCR: NEGATIVE

## 2021-12-24 LAB — I-STAT VENOUS BLOOD GAS, ED
Acid-Base Excess: 3 mmol/L — ABNORMAL HIGH (ref 0.0–2.0)
Bicarbonate: 27.7 mmol/L (ref 20.0–28.0)
Calcium, Ion: 1.18 mmol/L (ref 1.15–1.40)
HCT: 35 % — ABNORMAL LOW (ref 39.0–52.0)
Hemoglobin: 11.9 g/dL — ABNORMAL LOW (ref 13.0–17.0)
O2 Saturation: 97 %
Potassium: 3.9 mmol/L (ref 3.5–5.1)
Sodium: 136 mmol/L (ref 135–145)
TCO2: 29 mmol/L (ref 22–32)
pCO2, Ven: 44.4 mmHg (ref 44.0–60.0)
pH, Ven: 7.404 (ref 7.250–7.430)
pO2, Ven: 91 mmHg — ABNORMAL HIGH (ref 32.0–45.0)

## 2021-12-24 LAB — TROPONIN I (HIGH SENSITIVITY)
Troponin I (High Sensitivity): 11 ng/L (ref <18)
Troponin I (High Sensitivity): 10 ng/L (ref <18)

## 2021-12-24 LAB — BETA-HYDROXYBUTYRIC ACID: Beta-Hydroxybutyric Acid: 1.23 mmol/L — ABNORMAL HIGH (ref 0.05–0.27)

## 2021-12-24 LAB — CBG MONITORING, ED: Glucose-Capillary: 301 mg/dL — ABNORMAL HIGH (ref 70–99)

## 2021-12-24 LAB — LIPASE, BLOOD: Lipase: 22 U/L (ref 11–51)

## 2021-12-24 MED ORDER — IOHEXOL 350 MG/ML SOLN
75.0000 mL | Freq: Once | INTRAVENOUS | Status: AC | PRN
  Administered 2021-12-24: 75 mL via INTRAVENOUS

## 2021-12-24 MED ORDER — DROPERIDOL 2.5 MG/ML IJ SOLN
2.5000 mg | Freq: Once | INTRAMUSCULAR | Status: AC
  Administered 2021-12-24: 2.5 mg via INTRAVENOUS
  Filled 2021-12-24: qty 2

## 2021-12-24 MED ORDER — INSULIN ASPART 100 UNIT/ML IJ SOLN
5.0000 [IU] | Freq: Once | INTRAMUSCULAR | Status: AC
  Administered 2021-12-24: 5 [IU] via SUBCUTANEOUS

## 2021-12-24 MED ORDER — ONDANSETRON HCL 4 MG/2ML IJ SOLN
4.0000 mg | Freq: Once | INTRAMUSCULAR | Status: AC
  Administered 2021-12-24: 4 mg via INTRAVENOUS
  Filled 2021-12-24: qty 2

## 2021-12-24 MED ORDER — LIDOCAINE VISCOUS HCL 2 % MT SOLN
15.0000 mL | Freq: Once | OROMUCOSAL | Status: AC
  Administered 2021-12-24: 15 mL via ORAL
  Filled 2021-12-24: qty 15

## 2021-12-24 MED ORDER — MORPHINE SULFATE (PF) 4 MG/ML IV SOLN
4.0000 mg | Freq: Once | INTRAVENOUS | Status: AC
  Administered 2021-12-24: 4 mg via INTRAVENOUS
  Filled 2021-12-24: qty 1

## 2021-12-24 MED ORDER — METOCLOPRAMIDE HCL 10 MG PO TABS: 10.0000 mg | ORAL_TABLET | Freq: Four times a day (QID) | ORAL | 0 refills | Status: DC

## 2021-12-24 MED ORDER — PROCHLORPERAZINE MALEATE 10 MG PO TABS: 10.0000 mg | ORAL_TABLET | Freq: Two times a day (BID) | ORAL | 0 refills | Status: AC | PRN

## 2021-12-24 MED ORDER — SODIUM CHLORIDE 0.9 % IV BOLUS
1000.0000 mL | Freq: Once | INTRAVENOUS | Status: AC
  Administered 2021-12-24: 1000 mL via INTRAVENOUS

## 2021-12-24 MED ORDER — PROMETHAZINE HCL 25 MG RE SUPP: 25.0000 mg | Freq: Four times a day (QID) | RECTAL | 0 refills | Status: DC | PRN

## 2021-12-24 MED ORDER — DROPERIDOL 2.5 MG/ML IJ SOLN
1.2500 mg | Freq: Once | INTRAMUSCULAR | Status: AC
  Administered 2021-12-24: 1.25 mg via INTRAVENOUS
  Filled 2021-12-24: qty 2

## 2021-12-24 MED ORDER — DIPHENHYDRAMINE HCL 50 MG/ML IJ SOLN
25.0000 mg | Freq: Once | INTRAMUSCULAR | Status: AC
  Administered 2021-12-24: 25 mg via INTRAVENOUS
  Filled 2021-12-24: qty 1

## 2021-12-24 MED ORDER — ALUM & MAG HYDROXIDE-SIMETH 200-200-20 MG/5ML PO SUSP
30.0000 mL | Freq: Once | ORAL | Status: AC
  Administered 2021-12-24: 30 mL via ORAL
  Filled 2021-12-24: qty 30

## 2021-12-24 NOTE — ED Notes (Signed)
Patient transported to X-ray 

## 2021-12-24 NOTE — ED Notes (Signed)
Woke pt up to drink a couple of sips of water. Pt very sleepy, but he drank a few sips. This RN will continue to monitor for n/v

## 2021-12-24 NOTE — ED Provider Notes (Signed)
[name] EMERGENCY DEPARTMENT Provider Note   CSN: [id number] Arrival date & time: 12/24/21  1452     History  No chief complaint on file.   James Chang is a 56 y.o. male.  The history is provided by the patient and medical records. No language interpreter was used.  Abdominal Pain Associated symptoms: chest pain, constipation, nausea and vomiting   Associated symptoms: no chills, no cough, no diarrhea, no fatigue, no fever and no shortness of breath   Chest Pain Pain location:  Substernal area and epigastric Pain quality: aching and sharp   Pain radiates to:  Does not radiate Pain severity:  Severe Onset quality:  Sudden Duration:  1 hour Timing:  Constant Progression:  Unchanged Chronicity:  Recurrent Relieved by:  Nothing Worsened by:  Nothing Ineffective treatments:  None tried Associated symptoms: abdominal pain, nausea and vomiting   Associated symptoms: no altered mental status, no anxiety, no back pain, no cough, no dizziness, no fatigue, no fever, no headache, no lower extremity edema, no near-syncope, no numbness, no palpitations, no shortness of breath and no weakness       Home Medications Prior to Admission medications   Medication Sig Start Date End Date Taking? Authorizing Provider  atorvastatin (LIPITOR) 40 MG tablet Take 40 mg by mouth daily.    [provider]  FLUoxetine (PROZAC) 10 MG capsule Take 10 mg by mouth daily. 06/29/21   [provider]  gabapentin (NEURONTIN) 100 MG capsule Take 1 capsule (100 mg total) by mouth 3 (three) times daily. Patient not taking: Reported on 10/23/2021 09/09/21   Calvert Cantor, MD  insulin glargine (LANTUS SOLOSTAR) 100 UNIT/ML Solostar Pen Inject 40 Units into the skin at bedtime. 04/24/18   [provider]  insulin lispro (HUMALOG) 100 UNIT/ML injection Inject 5 Units into the skin 3 (three) times daily with meals. Patient not taking: Reported on 10/23/2021 08/06/21    [provider]  losartan (COZAAR) 50 MG tablet Take 50 mg by mouth daily. 05/23/21   [provider]  ondansetron (ZOFRAN ODT) 8 MG disintegrating tablet Take 1 tablet (8 mg total) by mouth every 8 (eight) hours as needed for nausea or vomiting. 08/31/21   Margarita Grizzle, MD  pantoprazole (PROTONIX) 40 MG tablet Take 40 mg by mouth daily. 08/08/20   [provider]  prochlorperazine (COMPAZINE) 10 MG tablet Take 1 tablet (10 mg total) by mouth 2 (two) times daily as needed for nausea or vomiting. 12/24/21   Arthor Captain, PA-C      Allergies    Metformin, Metformin and related, and Reglan [metoclopramide]    Review of Systems   Review of Systems  Constitutional:  Negative for chills, fatigue and fever.  HENT:  Negative for congestion.   Respiratory:  Negative for cough, chest tightness, shortness of breath, wheezing and stridor.   Cardiovascular:  Positive for chest pain. Negative for palpitations and near-syncope.  Gastrointestinal:  Positive for abdominal pain, constipation, nausea and vomiting. Negative for abdominal distention and diarrhea.  Genitourinary:  Negative for flank pain.  Musculoskeletal:  Negative for back pain, neck pain and neck stiffness.  Skin:  Negative for rash and wound.  Neurological:  Negative for dizziness, weakness, light-headedness, numbness and headaches.  Psychiatric/Behavioral:  Negative for agitation and confusion.   All other systems reviewed and are negative.  Physical Exam Updated Vital Signs BP (!) 161/101    Pulse 81    Temp 98.9 F (37.2 C) (Oral)  Resp 19    Ht 5\' 6"  (1.676 m)    Wt 83 kg    SpO2 100%    BMI 29.53 kg/m  Physical Exam Vitals and nursing note reviewed.  Constitutional:      General: He is not in acute distress.    Appearance: He is well-developed. He is not ill-appearing, toxic-appearing or diaphoretic.  HENT:     Head: Normocephalic and atraumatic.  Eyes:     Conjunctiva/sclera: Conjunctivae  normal.  Cardiovascular:     Rate and Rhythm: Normal rate and regular rhythm.     Heart sounds: No murmur heard. Pulmonary:     Effort: Pulmonary effort is normal. No respiratory distress.     Breath sounds: Normal breath sounds. No wheezing, rhonchi or rales.  Chest:     Chest wall: No tenderness.  Abdominal:     General: Abdomen is flat. There is no distension.     Palpations: Abdomen is soft.     Tenderness: There is abdominal tenderness. There is no right CVA tenderness, left CVA tenderness, guarding or rebound.  Musculoskeletal:        General: No swelling or tenderness.     Cervical back: Neck supple.  Skin:    General: Skin is warm and dry.     Capillary Refill: Capillary refill takes less than 2 seconds.     Findings: No erythema.  Neurological:     General: No focal deficit present.     Mental Status: He is alert.     Sensory: No sensory deficit.     Motor: No weakness.  Psychiatric:        Mood and Affect: Mood normal.    ED Results / Procedures / Treatments   Labs (all labs ordered are listed, but only abnormal results are displayed) Labs Reviewed  CBC WITH DIFFERENTIAL/PLATELET  COMPREHENSIVE METABOLIC PANEL  LIPASE, BLOOD  BLOOD GAS, VENOUS  BETA-HYDROXYBUTYRIC ACID  TROPONIN I (HIGH SENSITIVITY)    EKG EKG Interpretation  Date/Time:  Monday December 24 2021 15:03:04 EST Ventricular Rate:  69 PR Interval:  139 QRS Duration: 86 QT Interval:  394 QTC Calculation: 423 R Axis:   85 Text Interpretation: Sinus rhythm Atrial premature complexes Minimal ST elevation, anterior leads when compared to prior, similar appearance. No STEMI Confirmed by [id number] (Theda Belfast) on 12/24/2021 3:14:12 PM  Radiology CT ABDOMEN PELVIS W CONTRAST  Result Date: 12/24/2021 CLINICAL DATA:  Left upper quadrant pain EXAM: CT ABDOMEN AND PELVIS WITH CONTRAST TECHNIQUE: Multidetector CT imaging of the abdomen and pelvis was performed using the standard protocol following bolus  administration of intravenous contrast. RADIATION DOSE REDUCTION: This exam was performed according to the departmental dose-optimization program which includes automated exposure control, adjustment of the mA and/or kV according to patient size and/or use of iterative reconstruction technique. CONTRAST:  56mL OMNIPAQUE IOHEXOL 350 MG/ML SOLN COMPARISON:  08/30/2021 FINDINGS: Lower chest: No acute abnormality Hepatobiliary: No focal hepatic abnormality. Gallbladder unremarkable. Pancreas: No focal abnormality or ductal dilatation. Spleen: No focal abnormality.  Normal size. Adrenals/Urinary Tract: Small cyst in the midpole of the left kidney is unchanged. No renal or ureteral stones. No hydronephrosis. Adrenal glands and urinary bladder unremarkable. Stomach/Bowel: Moderate stool throughout the colon. Normal appendix. Stomach, large and small bowel grossly unremarkable. Vascular/Lymphatic: No evidence of aneurysm or adenopathy. Reproductive: No visible focal abnormality. Other: No free fluid or free air. Musculoskeletal: No acute bony abnormality. IMPRESSION: Moderate stool throughout the colon. No acute findings in the abdomen or  pelvis. Electronically Signed   By: Charlett NoseKevin  Dover M.D.   On: 12/24/2021 01:06    Procedures Procedures    Medications Ordered in ED Medications  alum & mag hydroxide-simeth (MAALOX/MYLANTA) 200-200-20 MG/5ML suspension 30 mL (has no administration in time range)    And  lidocaine (XYLOCAINE) 2 % viscous mouth solution 15 mL (has no administration in time range)  morphine (PF) 4 MG/ML injection 4 mg (has no administration in time range)  ondansetron (ZOFRAN) injection 4 mg (has no administration in time range)    ED Course/ Medical Decision Making/ A&P                           Medical Decision Making  James Chang is a 56 y.o. male with a past medical history significant for hypertension, hyperlipidemia, previous polysubstance abuse in the chart, schizoaffective  disorder, diabetes, stroke, and previous gastroparesis who presents with recurrent chest pain and nominal pain with nausea vomiting, patient.  According to patient, he was seen overnight and had reassuring CT and work-up.  He was feeling better and went home but then about 1 hour ago, started having severe 10 out of 10 pain in his epigastric area going to his chest again.  He reports nausea and vomiting and although he reports he has not had a bowel movement he has been passing gas.  No urinary changes.  Reports the pain is sharp and severe.  He is not having shortness of breath, lightheadedness, or syncope but had a little bit of diaphoresis with the discomfort.  It does not go to his back.  He reports no significant eating or drinking.  He had some cereal this morning which she tolerated.  Denies new leg pain or leg swelling.  Denies trauma.  Reports no other complaints.  EKG appears similar to prior with no STEMI.  On exam, abdomen is tender to palpation in the epigastric area and across the upper abdomen.  Lower abdomen is nontender.  Legs are nontender nonedematous.  Bowel sounds were appreciated.  Chest was nontender.  Lungs clear.  No murmur.  Good pulses in all extremities.  Patient will be given GI cocktail and some pain and nausea medicine.  We will get chest x-ray and right upper quadrant ultrasound as a CT within the last 24 hours was reassuring in the abdomen and pelvis.  Given his pain not going to his back, less suspicion for dissection at this time in the setting of his recent reassuring CT with contrast.  There is no evidence of aneurysm or adenopathy on the CT earlier.  Suspect this is a flare of his gastroparesis type pain.  Anticipate reassessment after work-up we will still get labs to look for changes and rule out DKA or other abnormality.  Anticipate reassessment and p.o. challenge after work-up is completed.  He will likely need a delta Trop given sudden onset of chest  pain.  Anticipate care being transferred to oncoming team to await delta troponin reassessment after work-up.        Final Clinical Impression(s) / ED Diagnoses Final diagnoses:  Epigastric abdominal pain  Epigastric pain  Atypical chest pain  Nausea and vomiting, unspecified vomiting type    Clinical Impression: 1. Epigastric pain   2. Epigastric abdominal pain   3. Atypical chest pain   4. Nausea and vomiting, unspecified vomiting type     Disposition: Care transferred to oncoming team to await delta troponin reassessment  This note was prepared with assistance of Conservation officer, historic buildings. Occasional wrong-word or sound-a-like substitutions may have occurred due to the inherent limitations of voice recognition software.     Maddisen Vought, Canary Brim, MD 12/24/21 [id number]

## 2021-12-24 NOTE — ED Notes (Signed)
Pt drank sips of water with no issue. Pt walked out of department with no assistance.

## 2021-12-24 NOTE — ED Triage Notes (Signed)
C/O chest pain, 10/10 radiating into abdomen. Seen in ED for abd pain/N/V/hyperglycemia yesterday. EMS reported ST elevation on EKG, but not called in as STEMI. Dr Sherry Ruffing at bedside on arrival, EKG on arrival.

## 2021-12-24 NOTE — ED Notes (Signed)
Patient returned from X-ray 

## 2021-12-24 NOTE — Discharge Instructions (Signed)
Try pepcid or tagamet up to twice a day.  Try to avoid things that may make this worse, most commonly these are spicy foods tomato based products fatty foods chocolate and peppermint.  Alcohol and tobacco can also make this worse.  Return to the emergency department for sudden worsening pain fever or inability to eat or drink.  

## 2021-12-24 NOTE — ED Notes (Signed)
Pt drank water and held it down for approx 20 min. Upon pt discharge, pt started throwing up again. Provider notified and medications ordered

## 2021-12-24 NOTE — Discharge Instructions (Signed)

## 2021-12-25 ENCOUNTER — Emergency Department (HOSPITAL_COMMUNITY)
Admission: EM | Admit: 2021-12-25 | Discharge: 2021-12-25 | Disposition: A | Discharge status: Home / Self Care | Payer: Medicare Other | Attending: Emergency Medicine | Admitting: Emergency Medicine

## 2021-12-25 ENCOUNTER — Other Ambulatory Visit: Payer: Self-pay

## 2021-12-25 ENCOUNTER — Encounter (HOSPITAL_COMMUNITY): Payer: Self-pay

## 2021-12-25 DIAGNOSIS — R1084 Generalized abdominal pain: Secondary | ICD-10-CM | POA: Diagnosis present

## 2021-12-25 DIAGNOSIS — Z20822 Contact with and (suspected) exposure to covid-19: Secondary | ICD-10-CM | POA: Insufficient documentation

## 2021-12-25 DIAGNOSIS — R112 Nausea with vomiting, unspecified: Secondary | ICD-10-CM | POA: Insufficient documentation

## 2021-12-25 DIAGNOSIS — R7889 Finding of other specified substances, not normally found in blood: Secondary | ICD-10-CM | POA: Insufficient documentation

## 2021-12-25 LAB — CBC WITH DIFFERENTIAL/PLATELET
Abs Immature Granulocytes: 0.03 10*3/uL (ref 0.00–0.07)
Basophils Absolute: 0 10*3/uL (ref 0.0–0.1)
Basophils Relative: 0 %
Eosinophils Absolute: 0 10*3/uL (ref 0.0–0.5)
Eosinophils Relative: 0 %
HCT: 36.5 % — ABNORMAL LOW (ref 39.0–52.0)
Hemoglobin: 12.3 g/dL — ABNORMAL LOW (ref 13.0–17.0)
Immature Granulocytes: 0 %
Lymphocytes Relative: 9 %
Lymphs Abs: 0.9 10*3/uL (ref 0.7–4.0)
MCH: 27.6 pg (ref 26.0–34.0)
MCHC: 33.7 g/dL (ref 30.0–36.0)
MCV: 82 fL (ref 80.0–100.0)
Monocytes Absolute: 0.3 10*3/uL (ref 0.1–1.0)
Monocytes Relative: 3 %
Neutro Abs: 9 10*3/uL — ABNORMAL HIGH (ref 1.7–7.7)
Neutrophils Relative %: 88 %
Platelets: 310 10*3/uL (ref 150–400)
RBC: 4.45 MIL/uL (ref 4.22–5.81)
RDW: 12.6 % (ref 11.5–15.5)
WBC: 10.3 10*3/uL (ref 4.0–10.5)
nRBC: 0 % (ref 0.0–0.2)

## 2021-12-25 LAB — COMPREHENSIVE METABOLIC PANEL
ALT: 18 U/L (ref 0–44)
AST: 18 U/L (ref 15–41)
Albumin: 3.7 g/dL (ref 3.5–5.0)
Alkaline Phosphatase: 70 U/L (ref 38–126)
Anion gap: 12 (ref 5–15)
BUN: 23 mg/dL — ABNORMAL HIGH (ref 6–20)
CO2: 24 mmol/L (ref 22–32)
Calcium: 9.3 mg/dL (ref 8.9–10.3)
Chloride: 98 mmol/L (ref 98–111)
Creatinine, Ser: 1.84 mg/dL — ABNORMAL HIGH (ref 0.61–1.24)
GFR, Estimated: 42 mL/min — ABNORMAL LOW (ref >60)
Glucose, Bld: 307 mg/dL — ABNORMAL HIGH (ref 70–99)
Potassium: 3.5 mmol/L (ref 3.5–5.1)
Sodium: 134 mmol/L — ABNORMAL LOW (ref 135–145)
Total Bilirubin: 1.1 mg/dL (ref 0.3–1.2)
Total Protein: 7.4 g/dL (ref 6.5–8.1)

## 2021-12-25 LAB — I-STAT VENOUS BLOOD GAS, ED
Acid-Base Excess: 1 mmol/L (ref 0.0–2.0)
Bicarbonate: 26.9 mmol/L (ref 20.0–28.0)
Calcium, Ion: 1.16 mmol/L (ref 1.15–1.40)
HCT: 38 % — ABNORMAL LOW (ref 39.0–52.0)
Hemoglobin: 12.9 g/dL — ABNORMAL LOW (ref 13.0–17.0)
O2 Saturation: 49 %
Potassium: 3.6 mmol/L (ref 3.5–5.1)
Sodium: 134 mmol/L — ABNORMAL LOW (ref 135–145)
TCO2: 28 mmol/L (ref 22–32)
pCO2, Ven: 44.5 mmHg (ref 44–60)
pH, Ven: 7.389 (ref 7.25–7.43)
pO2, Ven: 27 mmHg

## 2021-12-25 LAB — CBG MONITORING, ED: Glucose-Capillary: 303 mg/dL — ABNORMAL HIGH (ref 70–99)

## 2021-12-25 LAB — RESP PANEL BY RT-PCR (FLU A&B, COVID) ARPGX2
Influenza A by PCR: NEGATIVE
Influenza B by PCR: NEGATIVE
SARS Coronavirus 2 by RT PCR: NEGATIVE

## 2021-12-25 LAB — LIPASE, BLOOD: Lipase: 20 U/L (ref 11–51)

## 2021-12-25 MED ORDER — LIDOCAINE VISCOUS HCL 2 % MT SOLN
15.0000 mL | Freq: Once | OROMUCOSAL | Status: AC
Start: 2021-12-25 — End: 2021-12-25
  Administered 2021-12-25: 15 mL via ORAL
  Filled 2021-12-25: qty 15

## 2021-12-25 MED ORDER — ALUM & MAG HYDROXIDE-SIMETH 200-200-20 MG/5ML PO SUSP
30.0000 mL | Freq: Once | ORAL | Status: AC
  Administered 2021-12-25: 30 mL via ORAL
  Filled 2021-12-25: qty 30

## 2021-12-25 MED ORDER — FAMOTIDINE IN NACL 20-0.9 MG/50ML-% IV SOLN
20.0000 mg | Freq: Once | INTRAVENOUS | Status: AC
Start: 2021-12-25 — End: 2021-12-25
  Administered 2021-12-25: 20 mg via INTRAVENOUS
  Filled 2021-12-25: qty 50

## 2021-12-25 MED ORDER — SUCRALFATE 1 G PO TABS: 1.0000 g | ORAL_TABLET | Freq: Three times a day (TID) | ORAL | 0 refills | Status: DC

## 2021-12-25 MED ORDER — SODIUM CHLORIDE 0.9 % IV BOLUS
1000.0000 mL | Freq: Once | INTRAVENOUS | Status: AC
Start: 2021-12-25 — End: 2021-12-25
  Administered 2021-12-25: 1000 mL via INTRAVENOUS

## 2021-12-25 MED ORDER — INSULIN ASPART 100 UNIT/ML IJ SOLN
5.0000 [IU] | Freq: Once | INTRAMUSCULAR | Status: AC
  Administered 2021-12-25: 5 [IU] via SUBCUTANEOUS

## 2021-12-25 MED ORDER — FENTANYL CITRATE PF 50 MCG/ML IJ SOSY
50.0000 ug | PREFILLED_SYRINGE | Freq: Once | INTRAMUSCULAR | Status: AC
  Administered 2021-12-25: 50 ug via INTRAVENOUS
  Filled 2021-12-25: qty 1

## 2021-12-25 MED ORDER — LOSARTAN POTASSIUM 50 MG PO TABS
50.0000 mg | ORAL_TABLET | Freq: Every day | ORAL | Status: DC
Start: 2021-12-25 — End: 2021-12-25
  Administered 2021-12-25: 50 mg via ORAL
  Filled 2021-12-25: qty 1

## 2021-12-25 NOTE — ED Notes (Signed)
Pt having multiple episodes of dry heaving and 1 episode of vomiting that appeared to be partially digested food

## 2021-12-25 NOTE — ED Provider Notes (Signed)
[facility]EMERGENCY DEPT [facility] Emergency Department Provider Note MRN:  [id number]  Arrival date & time: 12/25/21     Chief Complaint   Abdominal Pain   History of Present Illness   James Chang is a 56 y.o. year-old male presents to the ED with chief complaint of abdominal pain and vomiting.  Was seen yesterday and on the evening of 2/12 for similar symptoms.  States that his vomiting has been persistent.  He describes generalized abdominal pain because of his persistent vomiting.  He reports subjective fevers and chills, but has not measured his temperature.  Denies diarrhea.  Denies successful treatments prior to arrival.  States that he was still vomiting when discharged yesterday.  Independent history provided by EMS, who reports patient has had abdominal pain and vomiting for the past several days.  Was treated with 4 mg of Zofran by EMS with some improvement.  Review of Systems  Pertinent review of systems noted in HPI.    Physical Exam   Vitals:   12/25/21 1315 12/25/21 1400  BP: (!) 167/83 (!) 191/107  Pulse: 76 76  Resp: 12 11  Temp:    SpO2: 99% 100%    CONSTITUTIONAL: Nontoxic-appearing, NAD NEURO:  Alert and oriented x 3, CN 3-12 grossly intact EYES:  eyes equal and reactive ENT/NECK:  Supple, no stridor  CARDIO: Normal rate, regular rhythm, appears well-perfused  PULM:  No respiratory distress, clear to auscultation bilaterally GI/GU:  non-distended, generalized abdominal discomfort, but without focal tenderness MSK/SPINE:  No gross deformities, no edema, moves all extremities  SKIN:  no rash, atraumatic   *Additional and/or pertinent findings included in MDM below  Diagnostic and Interventional Summary    EKG Interpretation  Date/Time:    Ventricular Rate:    PR Interval:    QRS Duration:   QT Interval:    QTC Calculation:   R Axis:     Text Interpretation:         Labs Reviewed  CBC WITH DIFFERENTIAL/PLATELET - Abnormal; Notable for  the following components:      Result Value   Hemoglobin 12.3 (*)    HCT 36.5 (*)    Neutro Abs 9.0 (*)    All other components within normal limits  COMPREHENSIVE METABOLIC PANEL - Abnormal; Notable for the following components:   Sodium 134 (*)    Glucose, Bld 307 (*)    BUN 23 (*)    Creatinine, Ser 1.84 (*)    GFR, Estimated 42 (*)    All other components within normal limits  I-STAT VENOUS BLOOD GAS, ED - Abnormal; Notable for the following components:   Sodium 134 (*)    HCT 38.0 (*)    Hemoglobin 12.9 (*)    All other components within normal limits  CBG MONITORING, ED - Abnormal; Notable for the following components:   Glucose-Capillary 303 (*)    All other components within normal limits  RESP PANEL BY RT-PCR (FLU A&B, COVID) ARPGX2  LIPASE, BLOOD    No orders to display    Medications  losartan (COZAAR) tablet 50 mg (50 mg Oral Given 12/25/21 1213)  insulin aspart (novoLOG) injection 5 Units (5 Units Subcutaneous Given 12/25/21 1212)  sodium chloride 0.9 % bolus 1,000 mL (0 mLs Intravenous Stopped 12/25/21 1335)  fentaNYL (SUBLIMAZE) injection 50 mcg (50 mcg Intravenous Given 12/25/21 1213)  alum & mag hydroxide-simeth (MAALOX/MYLANTA) 200-200-20 MG/5ML suspension 30 mL (30 mLs Oral Given 12/25/21 1334)    And  lidocaine (XYLOCAINE)  2 % viscous mouth solution 15 mL (15 mLs Oral Given 12/25/21 1334)  famotidine (PEPCID) IVPB 20 mg premix (0 mg Intravenous Stopped 12/25/21 1422)     Procedures  /  Critical Care Procedures  ED Course and Medical Decision Making  I have reviewed the triage vital signs, the nursing notes, and pertinent available records from the EMR.  Complexity of Problems Addressed: Moderate Complexity: Acute illness with systemic symptoms, requiring diagnostic workup to rule out more severe disease.  ED Course:    After considering the following differential, DKA, pancreatitis, cholecystitis, diabetic gastroparesis, viral gastroenteritis, I ordered  CBC, CMP, VBG, CBG.  I reviewed the labs which are notable for creatinine of 1.84 this is mildly increased from the most recent 2 visits.  Patient is given fluids to address this.  Glucose is 307.  VBG shows normal venous pH.  Doubt DKA. .  3:01 PM Patient has not had any further episodes of vomiting in the ED.  Will discharge home.  Encouraged him to continue taking the Zofran and omeprazole.  We will add Carafate.  Patient strongly encouraged to follow-up with his PCP.  Consultants: No consultations were needed in caring for this patient.   Treatment and Plan:  Patient treated with fluids, GI cocktail, and Pepcid, insulin, and some pain medicine.  He still complains of some generalized abdominal pain, but is no longer vomiting.  He is nontoxic in appearance.  I considered admission due to patient's initial presentation, but after considering the examination and diagnostic results, patient will not require admission and can be discharged with outpatient follow-up.  If patient returns with persistent vomiting, could consider admission for intractable vomiting, but given that patient is not vomiting now, and has not vomited several hours, I feel that he can be discharged.    Final Clinical Impressions(s) / ED Diagnoses     ICD-10-CM   1. Generalized abdominal pain  R10.84     2. Nausea and vomiting, unspecified vomiting type  R11.2       ED Discharge Orders          Ordered    sucralfate (CARAFATE) 1 g tablet  3 times daily with meals & bedtime        12/25/21 1457             Discharge Instructions Discussed with and Provided to Patient:     Discharge Instructions      Take medications as prescribed.  Please call your primary care doctor.  Return for worsening symptoms.       Roxy Horseman, PA-C 12/25/21 1501    Milagros Loll, MD 12/27/21 2124

## 2021-12-25 NOTE — ED Notes (Signed)
Pt able to tolerate water well without any episodes of emesis or nausea

## 2021-12-25 NOTE — ED Triage Notes (Signed)
Pt BIB EMS for pain that comes from his abdomen and goes up to his chest. Pt was here yesterday dx w/ gastrointestinal complications. He explains that he is not feeling any better today, having nausea and vomiting. He did not take BP meds this AM or insulin pt CBG was 400

## 2021-12-25 NOTE — Discharge Instructions (Addendum)
Take medications as prescribed.  Please call your primary care doctor.  Return for worsening symptoms.

## 2021-12-27 ENCOUNTER — Emergency Department (HOSPITAL_COMMUNITY)
Admission: EM | Admit: 2021-12-27 | Discharge: 2021-12-27 | Disposition: A | Discharge status: Home / Self Care | Payer: Medicare Other | Attending: Emergency Medicine | Admitting: Emergency Medicine

## 2021-12-27 ENCOUNTER — Other Ambulatory Visit: Payer: Self-pay

## 2021-12-27 ENCOUNTER — Encounter (HOSPITAL_COMMUNITY): Payer: Self-pay | Admitting: Emergency Medicine

## 2021-12-27 DIAGNOSIS — R110 Nausea: Secondary | ICD-10-CM | POA: Diagnosis not present

## 2021-12-27 DIAGNOSIS — R519 Headache, unspecified: Secondary | ICD-10-CM | POA: Diagnosis not present

## 2021-12-27 DIAGNOSIS — R531 Weakness: Secondary | ICD-10-CM | POA: Diagnosis not present

## 2021-12-27 DIAGNOSIS — R739 Hyperglycemia, unspecified: Secondary | ICD-10-CM | POA: Diagnosis present

## 2021-12-27 DIAGNOSIS — Z5321 Procedure and treatment not carried out due to patient leaving prior to being seen by health care provider: Secondary | ICD-10-CM | POA: Diagnosis not present

## 2021-12-27 DIAGNOSIS — R100 Acute abdomen: Secondary | ICD-10-CM | POA: Diagnosis not present

## 2021-12-27 LAB — CBG MONITORING, ED: Glucose-Capillary: 288 mg/dL — ABNORMAL HIGH (ref 70–99)

## 2021-12-27 NOTE — ED Provider Triage Note (Signed)
Emergency Medicine Provider Triage Evaluation Note  SHELLIE GOETTL , a 56 y.o. male  was evaluated in triage.  Pt complains of Hyperglycemia, feeling weak, headache and stomach ache.  He was seeen for this multiple times recently.  He states that since he was discharged he hasn't had any changes, has gotten his meds and had no changes in his symptoms.   He didn't take his long acting insulin this morning.   His abdominal pain and headache are unchanged.     Physical Exam  BP (!) 163/112 (BP Location: Right Arm)    Pulse 99    Temp 98.5 F (36.9 C) (Oral)    Resp 18    SpO2 100%  Gen:   Awake, appears to feel unwell Resp:  Normal effort  MSK:   Moves extremities without difficulty  Other:  Normal speech.  Answers questions with out difficulty.   Medical Decision Making  Medically screening exam initiated at 3:19 PM.  Appropriate orders placed.  Tyrell Antonio was informed that the remainder of the evaluation will be completed by another provider, this initial triage assessment does not replace that evaluation, and the importance of remaining in the ED until their evaluation is complete.     Cristina Gong, [place] 12/27/21 1524

## 2021-12-27 NOTE — ED Triage Notes (Signed)
Per EMS, patient from facility, hyperglycemic with weakness x1 week. D/c from [facility] today. CBG 321. C/o nausea.   20g L AC 4mg  Zofran EMS  BP 180/102 HR 100

## 2022-01-29 ENCOUNTER — Other Ambulatory Visit: Payer: Self-pay | Admitting: Family Medicine

## 2022-01-29 DIAGNOSIS — M5412 Radiculopathy, cervical region: Secondary | ICD-10-CM

## 2022-02-01 ENCOUNTER — Other Ambulatory Visit: Payer: Self-pay | Admitting: Family Medicine

## 2022-02-01 DIAGNOSIS — M25512 Pain in left shoulder: Secondary | ICD-10-CM

## 2022-02-07 ENCOUNTER — Other Ambulatory Visit: Payer: Self-pay | Admitting: Family Medicine

## 2022-02-07 ENCOUNTER — Ambulatory Visit
Admission: RE | Admit: 2022-02-07 | Discharge: 2022-02-07 | Disposition: A | Discharge status: Home / Self Care | Payer: Medicare Other | Source: Ambulatory Visit | Attending: Family Medicine | Admitting: Family Medicine

## 2022-02-07 DIAGNOSIS — M25512 Pain in left shoulder: Secondary | ICD-10-CM

## 2022-02-07 DIAGNOSIS — M5412 Radiculopathy, cervical region: Secondary | ICD-10-CM

## 2022-02-16 ENCOUNTER — Other Ambulatory Visit: Payer: Self-pay

## 2022-02-16 ENCOUNTER — Emergency Department (HOSPITAL_COMMUNITY)
Admission: EM | Admit: 2022-02-16 | Discharge: 2022-02-16 | Disposition: A | Discharge status: Home / Self Care | Payer: Medicare Other | Attending: Emergency Medicine | Admitting: Emergency Medicine

## 2022-02-16 ENCOUNTER — Encounter (HOSPITAL_COMMUNITY): Payer: Self-pay

## 2022-02-16 DIAGNOSIS — E1165 Type 2 diabetes mellitus with hyperglycemia: Secondary | ICD-10-CM | POA: Insufficient documentation

## 2022-02-16 DIAGNOSIS — Z794 Long term (current) use of insulin: Secondary | ICD-10-CM | POA: Insufficient documentation

## 2022-02-16 DIAGNOSIS — R1084 Generalized abdominal pain: Secondary | ICD-10-CM | POA: Diagnosis not present

## 2022-02-16 DIAGNOSIS — R112 Nausea with vomiting, unspecified: Secondary | ICD-10-CM | POA: Insufficient documentation

## 2022-02-16 LAB — COMPREHENSIVE METABOLIC PANEL
ALT: 15 U/L (ref 0–44)
AST: 17 U/L (ref 15–41)
Albumin: 3.9 g/dL (ref 3.5–5.0)
Alkaline Phosphatase: 76 U/L (ref 38–126)
Anion gap: 9 (ref 5–15)
BUN: 19 mg/dL (ref 6–20)
CO2: 25 mmol/L (ref 22–32)
Calcium: 9.1 mg/dL (ref 8.9–10.3)
Chloride: 99 mmol/L (ref 98–111)
Creatinine, Ser: 1.46 mg/dL — ABNORMAL HIGH (ref 0.61–1.24)
GFR, Estimated: 56 mL/min — ABNORMAL LOW (ref >60)
Glucose, Bld: 269 mg/dL — ABNORMAL HIGH (ref 70–99)
Potassium: 3.6 mmol/L (ref 3.5–5.1)
Sodium: 133 mmol/L — ABNORMAL LOW (ref 135–145)
Total Bilirubin: 1.1 mg/dL (ref 0.3–1.2)
Total Protein: 7.4 g/dL (ref 6.5–8.1)

## 2022-02-16 LAB — URINALYSIS, ROUTINE W REFLEX MICROSCOPIC
Bacteria, UA: NONE SEEN
Bilirubin Urine: NEGATIVE
Glucose, UA: >=500 mg/dL — AB
Ketones, ur: 20 mg/dL — AB
Leukocytes,Ua: NEGATIVE
Nitrite: NEGATIVE
Protein, ur: 100 mg/dL — AB
Specific Gravity, Urine: 1.014 (ref 1.005–1.030)
pH: 7 (ref 5.0–8.0)

## 2022-02-16 LAB — CBC WITH DIFFERENTIAL/PLATELET
Abs Immature Granulocytes: 0.04 10*3/uL (ref 0.00–0.07)
Basophils Absolute: 0 10*3/uL (ref 0.0–0.1)
Basophils Relative: 0 %
Eosinophils Absolute: 0 10*3/uL (ref 0.0–0.5)
Eosinophils Relative: 0 %
HCT: 36.3 % — ABNORMAL LOW (ref 39.0–52.0)
Hemoglobin: 12.7 g/dL — ABNORMAL LOW (ref 13.0–17.0)
Immature Granulocytes: 1 %
Lymphocytes Relative: 12 %
Lymphs Abs: 1 10*3/uL (ref 0.7–4.0)
MCH: 29.1 pg (ref 26.0–34.0)
MCHC: 35 g/dL (ref 30.0–36.0)
MCV: 83.1 fL (ref 80.0–100.0)
Monocytes Absolute: 0.3 10*3/uL (ref 0.1–1.0)
Monocytes Relative: 4 %
Neutro Abs: 6.8 10*3/uL (ref 1.7–7.7)
Neutrophils Relative %: 83 %
Platelets: 295 10*3/uL (ref 150–400)
RBC: 4.37 MIL/uL (ref 4.22–5.81)
RDW: 13.3 % (ref 11.5–15.5)
WBC: 8.2 10*3/uL (ref 4.0–10.5)
nRBC: 0 % (ref 0.0–0.2)

## 2022-02-16 LAB — CBG MONITORING, ED: Glucose-Capillary: 236 mg/dL — ABNORMAL HIGH (ref 70–99)

## 2022-02-16 LAB — LIPASE, BLOOD: Lipase: 25 U/L (ref 11–51)

## 2022-02-16 MED ORDER — SODIUM CHLORIDE 0.9 % IV BOLUS
1000.0000 mL | Freq: Once | INTRAVENOUS | Status: AC
  Administered 2022-02-16: 1000 mL via INTRAVENOUS

## 2022-02-16 MED ORDER — FENTANYL CITRATE PF 50 MCG/ML IJ SOSY
50.0000 ug | PREFILLED_SYRINGE | Freq: Once | INTRAMUSCULAR | Status: AC
  Administered 2022-02-16: 50 ug via INTRAVENOUS
  Filled 2022-02-16: qty 1

## 2022-02-16 MED ORDER — DROPERIDOL 2.5 MG/ML IJ SOLN
2.5000 mg | Freq: Once | INTRAMUSCULAR | Status: AC
  Administered 2022-02-16: 2.5 mg via INTRAVENOUS
  Filled 2022-02-16: qty 2

## 2022-02-16 NOTE — ED Notes (Signed)
Pt. Received a cup of water for a fluid challenge. Nurse aware. ?

## 2022-02-16 NOTE — ED Notes (Signed)
Pt. Aware of urine specimen. Will collect urine when pt. Voids. Nurse aware.  

## 2022-02-16 NOTE — ED Notes (Signed)
Patient BP high and provider is made aware. ?

## 2022-02-16 NOTE — ED Triage Notes (Addendum)
Patient presents to the ED with c/o abdominal pain, nausea and vomiting. Patient report constipation for 3 weeks. Given by EMS NS 300 ml and Zofran 4 mg ?

## 2022-02-16 NOTE — Discharge Instructions (Addendum)
Please read and follow all provided instructions. ? ?Your diagnoses today include:  ?1. Nausea and vomiting, unspecified vomiting type   ? ? ?Tests performed today include: ?Blood cell counts and platelets: Blood counts look okay ?Kidney and liver function tests: Kidney function is near your baseline ?Pancreas function test (called lipase) ?Urine test to look for infection: No sign of infection ?Vital signs. See below for your results today.  ? ?Medications prescribed:  ?None ? ?Take any prescribed medications only as directed. ? ?Home care instructions:  ?Follow any educational materials contained in this packet. ? ?Follow-up instructions: ?Please follow-up with your primary care provider in the next 3 days for further evaluation of your symptoms.   ? ?Return instructions:  ?SEEK IMMEDIATE MEDICAL ATTENTION IF: ?The pain does not go away or becomes severe  ?A temperature above 101F develops  ?Repeated vomiting occurs (multiple episodes)  ?The pain becomes localized to portions of the abdomen. The right side could possibly be appendicitis. In an adult, the left lower portion of the abdomen could be colitis or diverticulitis.  ?Blood is being passed in stools or vomit (bright red or black tarry stools)  ?You develop chest pain, difficulty breathing, dizziness or fainting, or become confused, poorly responsive, or inconsolable (young children) ?If you have any other emergent concerns regarding your health ? ?Additional Information: ?Abdominal (belly) pain can be caused by many things. Your caregiver performed an examination and possibly ordered blood/urine tests and imaging (CT scan, x-rays, ultrasound). Many cases can be observed and treated at home after initial evaluation in the emergency department. Even though you are being discharged home, abdominal pain can be unpredictable. Therefore, you need a repeated exam if your pain does not resolve, returns, or worsens. Most patients with abdominal pain don't have to be  admitted to the hospital or have surgery, but serious problems like appendicitis and gallbladder attacks can start out as nonspecific pain. Many abdominal conditions cannot be diagnosed in one visit, so follow-up evaluations are very important. ? ?Your vital signs today were: ?BP (!) 150/83   Pulse (!) 102   Temp 100.3 ?F (37.9 ?C) (Oral)   Resp (!) 4   Ht 5\' 6"  (1.676 m)   Wt 83.5 kg   SpO2 99%   BMI 29.70 kg/m?  ?If your blood pressure (bp) was elevated above 135/85 this visit, please have this repeated by your doctor within one month. ?-------------- ? ?

## 2022-02-16 NOTE — ED Provider Notes (Signed)
?[facility]-EMERGENCY DEPT ?Provider Note ? ? ?CSN: [id number] ?Arrival date & time: 02/16/22  0701 ? ?  ? ?History ? ?No chief complaint on file. ? ? ?James Chang is a 56 y.o. male. ? ?Patient with history of diabetes, insulin-dependent, recently increased dose, history of gastroparesis presents to the emergency department for 3 to 4 days of vomiting.  Patient also reports generalized abdominal pain.  He denies fever, chest pain.  He takes medicine at home which has not been helping.  No discrete urinary symptoms.  Symptoms are similar to previous episodes of gastroparesis.  Most recent CT scan 12/2021 showed moderate stool, no other acute findings.  Patient reports no significant bowel movement in the past 3 weeks.  No diarrhea. ? ? ?  ? ?Home Medications ?Prior to Admission medications   ?Medication Sig Start Date End Date Taking? Authorizing Provider  ?atorvastatin (LIPITOR) 40 MG tablet Take 40 mg by mouth daily.    [provider]  ?FLUoxetine (PROZAC) 10 MG capsule Take 10 mg by mouth daily. ?Patient not taking: Reported on 12/24/2021 06/29/21   [provider]  ?gabapentin (NEURONTIN) 100 MG capsule Take 1 capsule (100 mg total) by mouth 3 (three) times daily. ?Patient not taking: Reported on 10/23/2021 09/09/21   Calvert Cantor, MD  ?insulin glargine (LANTUS SOLOSTAR) 100 UNIT/ML Solostar Pen Inject 40 Units into the skin at bedtime. 04/24/18   [provider]  ?insulin lispro (HUMALOG) 100 UNIT/ML injection Inject 5 Units into the skin 3 (three) times daily with meals. ?Patient not taking: Reported on 10/23/2021 08/06/21   [provider]  ?losartan (COZAAR) 50 MG tablet Take 50 mg by mouth daily. ?Patient not taking: Reported on 12/24/2021 05/23/21   [provider]  ?metoCLOPramide (REGLAN) 10 MG tablet Take 1 tablet (10 mg total) by mouth every 6 (six) hours. 12/24/21   Melene Plan, DO  ?ondansetron (ZOFRAN ODT) 8 MG disintegrating tablet Take 1  tablet (8 mg total) by mouth every 8 (eight) hours as needed for nausea or vomiting. 08/31/21   Margarita Grizzle, MD  ?pantoprazole (PROTONIX) 40 MG tablet Take 40 mg by mouth daily. 08/08/20   [provider]  ?prochlorperazine (COMPAZINE) 10 MG tablet Take 1 tablet (10 mg total) by mouth 2 (two) times daily as needed for nausea or vomiting. ?Patient not taking: Reported on 12/24/2021 12/24/21   Arthor Captain, PA-C  ?promethazine (PHENERGAN) 25 MG suppository Place 1 suppository (25 mg total) rectally every 6 (six) hours as needed for nausea or vomiting. 12/24/21   Melene Plan, DO  ?sucralfate (CARAFATE) 1 g tablet Take 1 tablet (1 g total) by mouth 4 (four) times daily -  with meals and at bedtime. 12/25/21   Roxy Horseman, PA-C  ?   ? ?Allergies    ?Metformin, Metformin and related, and Reglan [metoclopramide]   ? ?Review of Systems   ?Review of Systems ? ?Physical Exam ?Updated Vital Signs ?Ht 5\' 6"  (1.676 m)   Wt 83.5 kg   BMI 29.70 kg/m?  ?Physical Exam ?Vitals and nursing note reviewed.  ?Constitutional:   ?   General: He is in acute distress.  ?   Appearance: He is well-developed.  ?   Comments: Patient appears uncomfortable.  ?HENT:  ?   Head: Normocephalic and atraumatic.  ?   Mouth/Throat:  ?   Mouth: Mucous membranes are dry.  ?Eyes:  ?   General:     ?   Right eye: No discharge.     ?  Left eye: No discharge.  ?   Conjunctiva/sclera: Conjunctivae normal.  ?Cardiovascular:  ?   Rate and Rhythm: Normal rate and regular rhythm.  ?   Heart sounds: Normal heart sounds.  ?Pulmonary:  ?   Effort: Pulmonary effort is normal.  ?   Breath sounds: Normal breath sounds.  ?Abdominal:  ?   Palpations: Abdomen is soft.  ?   Tenderness: There is abdominal tenderness. There is no guarding or rebound.  ?   Comments: Patient with episodes of vomiting on arrival.  Abdomen is generally tender without distention.  No focal tenderness.  ?Musculoskeletal:  ?   Cervical back: Normal range of motion and neck supple.   ?Skin: ?   General: Skin is warm and dry.  ?Neurological:  ?   Mental Status: He is alert.  ? ? ?ED Results / Procedures / Treatments   ?Labs ?(all labs ordered are listed, but only abnormal results are displayed) ?Labs Reviewed  ?CBC WITH DIFFERENTIAL/PLATELET - Abnormal; Notable for the following components:  ?    Result Value  ? Hemoglobin 12.7 (*)   ? HCT 36.3 (*)   ? All other components within normal limits  ?COMPREHENSIVE METABOLIC PANEL - Abnormal; Notable for the following components:  ? Sodium 133 (*)   ? Glucose, Bld 269 (*)   ? Creatinine, Ser 1.46 (*)   ? GFR, Estimated 56 (*)   ? All other components within normal limits  ?CBG MONITORING, ED - Abnormal; Notable for the following components:  ? Glucose-Capillary 236 (*)   ? All other components within normal limits  ?LIPASE, BLOOD  ? ?ED ECG REPORT ? ? Date: 02/16/2022 ? Rate: 91 ? Rhythm: normal sinus rhythm ? QRS Axis: normal ? Intervals: normal ? ST/T Wave abnormalities: normal ? Conduction Disutrbances:none ? Narrative Interpretation:  ? Old EKG Reviewed: unchanged from 12/2021 ? ?I have personally reviewed the EKG tracing and agree with the computerized printout as noted. ? ? ?Radiology ?No results found. ? ?Procedures ?Procedures  ? ? ?Medications Ordered in ED ?Medications  ?sodium chloride 0.9 % bolus 1,000 mL (0 mLs Intravenous Stopped 02/16/22 0941)  ?droperidol (INAPSINE) 2.5 MG/ML injection 2.5 mg (2.5 mg Intravenous Given 02/16/22 0744)  ?fentaNYL (SUBLIMAZE) injection 50 mcg (50 mcg Intravenous Given 02/16/22 0745)  ?fentaNYL (SUBLIMAZE) injection 50 mcg (50 mcg Intravenous Given 02/16/22 0941)  ?sodium chloride 0.9 % bolus 1,000 mL (1,000 mLs Intravenous New Bag/Given 02/16/22 1032)  ? ? ?ED Course/ Medical Decision Making/ A&P ?  ? ?Patient seen and examined. History obtained directly from patient.  ? ?Labs/EKG: Ordered CBC, CMP, lipase, blood sugar, EKG. ? ?Imaging: None ordered ? ?Medications/Fluids: Ordered: IV fluid bolus, fentanyl 50 mcg,  droperidol 2.5 mg. ? ?Most recent vital signs reviewed and are as follows: ?BP (!) 167/129   Pulse (!) 105   Resp 20   Ht 5\' 6"  (1.676 m)   Wt 83.5 kg   SpO2 99%   BMI 29.70 kg/m?  ? ?Initial impression: Nausea and vomiting in setting of diabetes ? ?9:17 AM Reassessment performed. Patient appears more comfortable.  States that nausea is improved.  He has not had any further vomiting.  Requesting additional dose of pain medication. ? ?Heart rate has been trending faster and blood pressure has been elevated at times into the 200s systolic range.  Will continue to monitor. ? ?Labs personally reviewed and interpreted including: CBC with normal white blood cell count, hemoglobin 12.7; CMP with glucose 269, creatinine at baseline  1.46, normal liver function, normal anion gap; lipase normal.  ? ?Reviewed pertinent lab work and imaging with patient at bedside. Questions answered.  ? ?Most current vital signs reviewed and are as follows: ?BP (!) 167/129   Pulse (!) 105   Resp 20   Ht  (1.676 m)   Wt 83.5 kg   SpO2 99%   BMI 29.70 kg/m?  ? ?Plan: Patient has a temperature of 100.3 ?F, added UA. ? ?12:52 PM Reassessment performed. Patient appears comfortable. No further vomiting.  ? ?Labs personally reviewed and interpreted including: UA negative.  ? ?Reviewed pertinent lab work and imaging with patient at bedside. Questions answered.  ? ?Most current vital signs reviewed and are as follows: ?BP (!) 150/83   Pulse (!) 102   Temp 100.3 ?F (37.9 ?C) (Oral)   Resp (!) 4   Ht  (1.676 m)   Wt 83.5 kg   SpO2 99%   BMI 29.70 kg/m?  ? ?Plan: Discharge to home.  ? ?Prescriptions written for: None. States he uses zofran and has enough at home.  ? ?ED return instructions discussed: The patient was urged to return to the Emergency Department immediately with worsening of current symptoms, worsening abdominal pain, persistent vomiting, blood noted in stools, fever, or any other concerns. The patient verbalized  understanding.  ? ?Follow-up instructions discussed: Patient encouraged to follow-up with their PCP in 3 days.  ? ?                        ?Medical Decision Making ?Amount and/or Complexity of Data Reviewed ?Labs: ordered. ? ?Ri

## 2022-03-25 NOTE — Progress Notes (Signed)
[facility] Note  03/27/2022     CHIEF COMPLAINT Patient presents for Retina Evaluation   HISTORY OF PRESENT ILLNESS: James Chang is a 56 y.o. male who presents to the clinic today for:   HPI     Retina Evaluation   In both eyes.  I, the attending physician,  performed the HPI with the patient and updated documentation appropriately.        Comments   Patient here for Retina Evaluation. Referred by Dr Loura Pardon. Patient states vision is bad. Can't see with glasses. Has had laser sx at [facility]. Has cataracts. Has a broken blood vessel bleeding. No eye pain. Taking gabapentin 352m x 2.      Last edited by ZBernarda Caffey MD on 03/28/2022 11:33 PM.    [facility] [place]  [facility]  Unable to see well with glasses.  BS 600 a few weeks ago.  He hadn't been taking his diabetic medication. He is now working with doctor to manage now.  Referring physician: PLoura Pardon MD [address]  [place] [address] HISTORICAL INFORMATION:   Selected notes from the MEDICAL RECORD NUMBER Referred by Dr. PDorian Podfor DM exam Ocular Hx- PDR s/p PRP OU ([facility]) PMH- Diabetic    CURRENT MEDICATIONS: No current outpatient medications on file. (Ophthalmic Drugs)   No current facility-administered medications for this visit. (Ophthalmic Drugs)   Current Outpatient Medications (Other)  Medication Sig   atorvastatin (LIPITOR) 40 MG tablet Take 40 mg by mouth daily.   insulin glargine (LANTUS SOLOSTAR) 100 UNIT/ML Solostar Pen Inject 40 Units into the skin at bedtime.   ondansetron (ZOFRAN ODT) 8 MG disintegrating tablet Take 1 tablet (8 mg total) by mouth every 8 (eight) hours as needed for nausea or vomiting.   pantoprazole (PROTONIX) 40 MG tablet Take 40 mg by mouth daily.   FLUoxetine (PROZAC) 10 MG capsule Take 10 mg by mouth daily. (Patient not taking: Reported on  12/24/2021)   gabapentin (NEURONTIN) 100 MG capsule Take 1 capsule (100 mg total) by mouth 3 (three) times daily. (Patient not taking: Reported on 10/23/2021)   insulin lispro (HUMALOG) 100 UNIT/ML injection Inject 5 Units into the skin 3 (three) times daily with meals. (Patient not taking: Reported on 10/23/2021)   losartan (COZAAR) 50 MG tablet Take 50 mg by mouth daily. (Patient not taking: Reported on 12/24/2021)   metoCLOPramide (REGLAN) 10 MG tablet Take 1 tablet (10 mg total) by mouth every 6 (six) hours. (Patient not taking: Reported on 03/27/2022)   prochlorperazine (COMPAZINE) 10 MG tablet Take 1 tablet (10 mg total) by mouth 2 (two) times daily as needed for nausea or vomiting. (Patient not taking: Reported on 12/24/2021)   promethazine (PHENERGAN) 25 MG suppository Place 1 suppository (25 mg total) rectally every 6 (six) hours as needed for nausea or vomiting. (Patient not taking: Reported on 03/27/2022)   sucralfate (CARAFATE) 1 g tablet Take 1 tablet (1 g total) by mouth 4 (four) times daily -  with meals and at bedtime. (Patient not taking: Reported on 03/27/2022)   No current facility-administered medications for this visit. (Other)   REVIEW OF SYSTEMS: ROS   Positive for: Eyes Last edited by CTheodore Demark COA on 03/27/2022  1:59 PM.     ALLERGIES Allergies  Allergen Reactions   Metformin Diarrhea   Metformin And Related Diarrhea  Reglan [Metoclopramide] Diarrhea   PAST MEDICAL HISTORY Past Medical History:  Diagnosis Date   Depression    Diabetes mellitus without complication ([place])    History of migraine    Hyperlipidemia    Hypertension    Personal history of drug abuse ([place])    Stroke ([place]) 2008   Past Surgical History:  Procedure Laterality Date   NO PAST SURGERIES     FAMILY HISTORY Family History  Problem Relation Age of Onset   Arthritis Mother    Stroke Father    Hypertension Father    Kidney disease Father    Heart attack Father    Cancer Neg Hx     Diabetes Neg Hx    SOCIAL HISTORY Social History   Tobacco Use   Smoking status: Never   Smokeless tobacco: Never  Vaping Use   Vaping Use: Never used  Substance Use Topics   Alcohol use: Yes    Comment: seldom 1-2 times a month beer   Drug use: Yes    Types: Cocaine    Comment: "a couple days ago"       OPHTHALMIC EXAM:  Base Eye Exam     Visual Acuity (Snellen - Linear)       Right Left   Dist cc 20/60 -2 20/25   Dist ph cc NI NI    Correction: Glasses         Tonometry (Tonopen, 1:54 PM)       Right Left   Pressure 13 12         Pupils       Dark Light Shape React APD   Right 2 1 Round Minimal None   Left 3 2 Round Minimal None         Visual Fields       Left Right   Restrictions Partial outer inferior temporal deficiency Partial outer superior temporal, inferior temporal, superior nasal, inferior nasal deficiencies         Extraocular Movement       Right Left    Full, Ortho Full, Ortho         Neuro/Psych     Oriented x3: Yes   Mood/Affect: Normal         Dilation     Both eyes: 1.0% Mydriacyl, 2.5% Phenylephrine @ 1:53 PM           Slit Lamp and Fundus Exam     External Exam       Right Left   External Normal Normal         Slit Lamp Exam       Right Left   Lids/Lashes Dermatochalasis - upper lid, Meibomian gland dysfunction Dermatochalasis - upper lid, Meibomian gland dysfunction   Conjunctiva/Sclera Pinguecula, Melanosis White and quiet   Cornea Arcus Arcus   Anterior Chamber Deep and clear Deep and clear   Iris Round and moderately dilated 4.41m, No NVI Round and moderately dilated 4.779m  Lens 2+ Nuclear sclerosis, 2+ Cortical cataract 2+ Nuclear sclerosis, 2+ Posterior capsular opacification   Anterior Vitreous Vitreous syneresis Vitreous syneresis         Fundus Exam       Right Left   Disc Pink and sharp, +fibrosis Pink and sharp   C/D Ratio 0.6 0.6   Macula Blunted foveal reflex,  lamellar hole, tractional fibrosis along arcades temporal mac, scattered DBH Flat, Blunted foveal reflex, ERM with striae, +fibrosis, DBH   Vessels Attenuated, Tortuous, +fibrotic NVE  Attenuated, Tortuous, AV crossing changes, +NVE along arcades   Periphery Attached, scattered DBH, scattered fibrosis, 360 PRP with room for fill in Attached, scattered MA/ DBH, very light PRP 360           Refraction     Wearing Rx       Sphere Cylinder Axis Add   Right +1.50 +0.50 013 +2.50   Left +2.00 Sphere  +2.50         Manifest Refraction       Sphere Cylinder Axis Dist VA Add   Right +1.25 +0.75 013 20/60 +2.50   Left +2.25 Sphere  20/25 +2.50            IMAGING AND PROCEDURES  Imaging and Procedures for 03/27/2022  OCT, Retina - OU - Both Eyes       Right Eye Quality was poor. Central Foveal Thickness: 952. Progression has no prior data. Findings include abnormal foveal contour, intraretinal fluid, no SRF, epiretinal membrane, intraretinal hyper-reflective material, preretinal fibrosis (Extensive preretinal fibrosis with tractional edema).   Left Eye Quality was good. Central Foveal Thickness: 276. Progression has no prior data. Findings include abnormal foveal contour, intraretinal fluid, intraretinal hyper-reflective material, macular pucker, preretinal fibrosis, epiretinal membrane (Preretinal fibrosis with pucker greatest along temporal arcades).   Notes *Images captured and stored on drive  Diagnosis / Impression:  DME OU OD: Extensive preretinal fibrosis with tractional edema OS: Preretinal fibrosis with pucker greatest along temporal arcades  Clinical management:  See below  Abbreviations: NFP - Normal foveal profile. CME - cystoid macular edema. PED - pigment epithelial detachment. IRF - intraretinal fluid. SRF - subretinal fluid. EZ - ellipsoid zone. ERM - epiretinal membrane. ORA - outer retinal atrophy. ORT - outer retinal tubulation. SRHM - subretinal  hyper-reflective material. IRHM - intraretinal hyper-reflective material      Fluorescein Angiography Optos (Transit OD)       Right Eye Progression has no prior data. Early phase findings include staining, microaneurysm, retinal neovascularization, vascular perfusion defect. Mid/Late phase findings include leakage, staining, microaneurysm, vascular perfusion defect, retinal neovascularization (Florid NVE greatest along temporal arcades).   Left Eye Progression has no prior data. Early phase findings include microaneurysm, vascular perfusion defect, retinal neovascularization, staining. Mid/Late phase findings include leakage, staining, microaneurysm, retinal neovascularization, vascular perfusion defect (Florid NVE greatest along temporal macula).   Notes **Images stored on drive**  Impression: PDR OU OD: Florid NVE greatest along temporal arcades OS:  Florid NVE greatest along temporal macula            ASSESSMENT/PLAN:    ICD-10-CM   1. Proliferative diabetic retinopathy of both eyes with macular edema associated with type 2 diabetes mellitus (HCC)  S85.4627 OCT, Retina - OU - Both Eyes    Fluorescein Angiography Optos (Transit OD)    2. Essential hypertension  I10     3. Hypertensive retinopathy of both eyes  H35.033 OCT, Retina - OU - Both Eyes    Fluorescein Angiography Optos (Transit OD)    4. Combined forms of age-related cataract of both eyes  H25.813       Proliferative diabetic retinopathy OU - A1C 11.7 on 10.27.22 - hx PRP OU in 2020 by Dr. Raylene Miyamoto -- has been lost to retina f/u since 2020 - The incidence, risk factors for progression, natural history and treatment options for diabetic retinopathy were discussed with patient.   - The need for close monitoring of blood glucose, blood pressure, and serum lipids, avoiding cigarette or  any type of tobacco, and the need for long term follow up was also discussed with patient. - exam shows scattered MAs/DBH OU;  scattered fibrosis and NVE OU - FA 5.17.23 shows Florid NVE greatest along temporal arcades OD, along temporal macula OS - OCT shows diabetic macular edema OU, OD: Extensive preretinal fibrosis with tractional edema; OS: Preretinal fibrosis with pucker greatest along temporal arcades - The natural history, pathology, and characteristics of diabetic macular edema discussed with patient.  A generalized discussion of the major clinical trials concerning treatment of diabetic macular edema (ETDRS, DCT, SCORE, RISE / RIDE, and ongoing DRCR net studies) was completed.  This discussion included mention of the various approaches to treating diabetic macular edema (observation, laser photocoagulation, anti-VEGF injections with lucentis / Avastin / Eylea, steroid injections with Kenalog / Ozurdex, and intraocular surgery with vitrectomy).  The goal hemoglobin A1C of 6-7 was discussed, as well as importance of smoking cessation and hypertension control.  Need for ongoing treatment and monitoring were specifically discussed with reference to chronic nature of diabetic macular edema. - recommend PRP fill in OU -- OD first - return Tuesday DEF/OCT/PRP OD  2,3 Hypertensive retinopathy OU - discussed importance of tight BP control  - will continue to monitor  4. Mixed Cataract OU - The symptoms of cataract, surgical options, and treatments and risks were discussed with patient. - discussed diagnosis and progression - monitor   Ophthalmic Meds Ordered this visit:  No orders of the defined types were placed in this encounter.      Return for PDR OU - DFE, OCT, Laser PRP OD.  There are no Patient Instructions on file for this visit.   Explained the diagnoses, plan, and follow up with the patient and they expressed understanding.  Patient expressed understanding of the importance of proper follow up care.   This document serves as a record of services personally performed by Gardiner Sleeper, MD, PhD. It was  created on their behalf by Orvan Falconer, an ophthalmic technician. The creation of this record is the provider's dictation and/or activities during the visit.    Electronically signed by: Orvan Falconer, OA, 03/28/22  11:43 PM  This document serves as a record of services personally performed by Gardiner Sleeper, MD, PhD. It was created on their behalf by Leonie Douglas, an ophthalmic technician. The creation of this record is the provider's dictation and/or activities during the visit.    Electronically signed by: Leonie Douglas COA, 03/28/22  11:43 PM   Gardiner Sleeper, M.D., Ph.D. Diseases & Surgery of the Retina and Vitreous [facility]  I have reviewed the above documentation for accuracy and completeness, and I agree with the above. Gardiner Sleeper, M.D., Ph.D. 03/28/22 11:43 PM   Abbreviations: M myopia (nearsighted); A astigmatism; H hyperopia (farsighted); P presbyopia; Mrx spectacle prescription;  CTL contact lenses; OD right eye; OS left eye; OU both eyes  XT exotropia; ET esotropia; PEK punctate epithelial keratitis; PEE punctate epithelial erosions; DES dry eye syndrome; MGD meibomian gland dysfunction; ATs artificial tears; PFAT's preservative free artificial tears; [place] nuclear sclerotic cataract; PSC posterior subcapsular cataract; ERM epi-retinal membrane; PVD posterior vitreous detachment; RD retinal detachment; DM diabetes mellitus; DR diabetic retinopathy; NPDR non-proliferative diabetic retinopathy; PDR proliferative diabetic retinopathy; CSME clinically significant macular edema; DME diabetic macular edema; dbh dot blot hemorrhages; CWS cotton wool spot; POAG primary open angle glaucoma; C/D cup-to-disc ratio; HVF humphrey visual field; GVF goldmann visual field; OCT optical  coherence tomography; IOP intraocular pressure; BRVO Branch retinal vein occlusion; CRVO central retinal vein occlusion; CRAO central retinal artery occlusion; BRAO branch retinal  artery occlusion; RT retinal tear; SB scleral buckle; PPV pars plana vitrectomy; VH Vitreous hemorrhage; PRP panretinal laser photocoagulation; IVK intravitreal kenalog; VMT vitreomacular traction; MH Macular hole;  NVD neovascularization of the disc; NVE neovascularization elsewhere; AREDS age related eye disease study; ARMD age related macular degeneration; POAG primary open angle glaucoma; EBMD epithelial/anterior basement membrane dystrophy; ACIOL anterior chamber intraocular lens; IOL intraocular lens; PCIOL posterior chamber intraocular lens; Phaco/IOL phacoemulsification with intraocular lens placement; [place] photorefractive keratectomy; LASIK laser assisted in situ keratomileusis; HTN hypertension; DM diabetes mellitus; COPD chronic obstructive pulmonary disease

## 2022-03-27 ENCOUNTER — Encounter (INDEPENDENT_AMBULATORY_CARE_PROVIDER_SITE_OTHER): Payer: Self-pay | Admitting: Ophthalmology

## 2022-03-27 ENCOUNTER — Ambulatory Visit (INDEPENDENT_AMBULATORY_CARE_PROVIDER_SITE_OTHER): Payer: Medicare Other | Admitting: Ophthalmology

## 2022-03-27 DIAGNOSIS — H35033 Hypertensive retinopathy, bilateral: Secondary | ICD-10-CM | POA: Diagnosis not present

## 2022-03-27 DIAGNOSIS — I10 Essential (primary) hypertension: Secondary | ICD-10-CM

## 2022-03-27 DIAGNOSIS — E113513 Type 2 diabetes mellitus with proliferative diabetic retinopathy with macular edema, bilateral: Secondary | ICD-10-CM

## 2022-03-27 DIAGNOSIS — H25813 Combined forms of age-related cataract, bilateral: Secondary | ICD-10-CM | POA: Diagnosis not present

## 2022-03-27 DIAGNOSIS — H3581 Retinal edema: Secondary | ICD-10-CM

## 2022-03-28 ENCOUNTER — Encounter (INDEPENDENT_AMBULATORY_CARE_PROVIDER_SITE_OTHER): Payer: Self-pay | Admitting: Ophthalmology

## 2022-04-02 ENCOUNTER — Encounter (INDEPENDENT_AMBULATORY_CARE_PROVIDER_SITE_OTHER): Payer: Medicare Other | Admitting: Ophthalmology

## 2022-04-04 ENCOUNTER — Emergency Department (HOSPITAL_COMMUNITY): Payer: Medicare Other

## 2022-04-04 ENCOUNTER — Encounter (HOSPITAL_COMMUNITY): Payer: Self-pay

## 2022-04-04 ENCOUNTER — Observation Stay (HOSPITAL_COMMUNITY): Payer: Medicare Other

## 2022-04-04 ENCOUNTER — Inpatient Hospital Stay (HOSPITAL_COMMUNITY)
Admission: EM | Admit: 2022-04-04 | Discharge: 2022-04-06 | DRG: 312 | Disposition: A | Discharge status: Home / Self Care | Payer: Medicare Other | Attending: Internal Medicine | Admitting: Internal Medicine

## 2022-04-04 ENCOUNTER — Other Ambulatory Visit: Payer: Self-pay

## 2022-04-04 DIAGNOSIS — I160 Hypertensive urgency: Secondary | ICD-10-CM | POA: Diagnosis present

## 2022-04-04 DIAGNOSIS — Z823 Family history of stroke: Secondary | ICD-10-CM

## 2022-04-04 DIAGNOSIS — Z888 Allergy status to other drugs, medicaments and biological substances status: Secondary | ICD-10-CM

## 2022-04-04 DIAGNOSIS — Z8261 Family history of arthritis: Secondary | ICD-10-CM

## 2022-04-04 DIAGNOSIS — I129 Hypertensive chronic kidney disease with stage 1 through stage 4 chronic kidney disease, or unspecified chronic kidney disease: Secondary | ICD-10-CM | POA: Diagnosis present

## 2022-04-04 DIAGNOSIS — Z794 Long term (current) use of insulin: Secondary | ICD-10-CM

## 2022-04-04 DIAGNOSIS — E871 Hypo-osmolality and hyponatremia: Secondary | ICD-10-CM | POA: Diagnosis present

## 2022-04-04 DIAGNOSIS — R55 Syncope and collapse: Secondary | ICD-10-CM

## 2022-04-04 DIAGNOSIS — R112 Nausea with vomiting, unspecified: Principal | ICD-10-CM

## 2022-04-04 DIAGNOSIS — Z841 Family history of disorders of kidney and ureter: Secondary | ICD-10-CM

## 2022-04-04 DIAGNOSIS — Z79899 Other long term (current) drug therapy: Secondary | ICD-10-CM

## 2022-04-04 DIAGNOSIS — I10 Essential (primary) hypertension: Secondary | ICD-10-CM | POA: Diagnosis present

## 2022-04-04 DIAGNOSIS — F1420 Cocaine dependence, uncomplicated: Secondary | ICD-10-CM | POA: Diagnosis present

## 2022-04-04 DIAGNOSIS — K5900 Constipation, unspecified: Secondary | ICD-10-CM | POA: Diagnosis present

## 2022-04-04 DIAGNOSIS — E785 Hyperlipidemia, unspecified: Secondary | ICD-10-CM | POA: Diagnosis present

## 2022-04-04 DIAGNOSIS — F1410 Cocaine abuse, uncomplicated: Secondary | ICD-10-CM

## 2022-04-04 DIAGNOSIS — N179 Acute kidney failure, unspecified: Secondary | ICD-10-CM

## 2022-04-04 DIAGNOSIS — E1165 Type 2 diabetes mellitus with hyperglycemia: Secondary | ICD-10-CM | POA: Diagnosis present

## 2022-04-04 DIAGNOSIS — K3184 Gastroparesis: Secondary | ICD-10-CM

## 2022-04-04 DIAGNOSIS — E860 Dehydration: Secondary | ICD-10-CM | POA: Diagnosis present

## 2022-04-04 DIAGNOSIS — R739 Hyperglycemia, unspecified: Secondary | ICD-10-CM

## 2022-04-04 DIAGNOSIS — N1831 Chronic kidney disease, stage 3a: Secondary | ICD-10-CM

## 2022-04-04 DIAGNOSIS — E1143 Type 2 diabetes mellitus with diabetic autonomic (poly)neuropathy: Secondary | ICD-10-CM | POA: Diagnosis present

## 2022-04-04 DIAGNOSIS — Z8249 Family history of ischemic heart disease and other diseases of the circulatory system: Secondary | ICD-10-CM

## 2022-04-04 LAB — COMPREHENSIVE METABOLIC PANEL
ALT: 17 U/L (ref 0–44)
AST: 15 U/L (ref 15–41)
Albumin: 4 g/dL (ref 3.5–5.0)
Alkaline Phosphatase: 74 U/L (ref 38–126)
Anion gap: 7 (ref 5–15)
BUN: 40 mg/dL — ABNORMAL HIGH (ref 6–20)
CO2: 30 mmol/L (ref 22–32)
Calcium: 9.3 mg/dL (ref 8.9–10.3)
Chloride: 93 mmol/L — ABNORMAL LOW (ref 98–111)
Creatinine, Ser: 2.08 mg/dL — ABNORMAL HIGH (ref 0.61–1.24)
GFR, Estimated: 36 mL/min — ABNORMAL LOW (ref >60)
Glucose, Bld: 370 mg/dL — ABNORMAL HIGH (ref 70–99)
Potassium: 3.6 mmol/L (ref 3.5–5.1)
Sodium: 130 mmol/L — ABNORMAL LOW (ref 135–145)
Total Bilirubin: 1.1 mg/dL (ref 0.3–1.2)
Total Protein: 7.3 g/dL (ref 6.5–8.1)

## 2022-04-04 LAB — URINALYSIS, ROUTINE W REFLEX MICROSCOPIC
Bilirubin Urine: NEGATIVE
Glucose, UA: >=500 mg/dL — AB
Ketones, ur: NEGATIVE mg/dL
Leukocytes,Ua: NEGATIVE
Nitrite: NEGATIVE
Protein, ur: 100 mg/dL — AB
Specific Gravity, Urine: 1.014 (ref 1.005–1.030)
pH: 6 (ref 5.0–8.0)

## 2022-04-04 LAB — CBC WITH DIFFERENTIAL/PLATELET
Abs Immature Granulocytes: 0.03 10*3/uL (ref 0.00–0.07)
Basophils Absolute: 0 10*3/uL (ref 0.0–0.1)
Basophils Relative: 0 %
Eosinophils Absolute: 0.1 10*3/uL (ref 0.0–0.5)
Eosinophils Relative: 1 %
HCT: 40 % (ref 39.0–52.0)
Hemoglobin: 14.1 g/dL (ref 13.0–17.0)
Immature Granulocytes: 0 %
Lymphocytes Relative: 13 %
Lymphs Abs: 1.1 10*3/uL (ref 0.7–4.0)
MCH: 28.6 pg (ref 26.0–34.0)
MCHC: 35.3 g/dL (ref 30.0–36.0)
MCV: 81.1 fL (ref 80.0–100.0)
Monocytes Absolute: 0.4 10*3/uL (ref 0.1–1.0)
Monocytes Relative: 5 %
Neutro Abs: 6.7 10*3/uL (ref 1.7–7.7)
Neutrophils Relative %: 81 %
Platelets: 313 10*3/uL (ref 150–400)
RBC: 4.93 MIL/uL (ref 4.22–5.81)
RDW: 12.9 % (ref 11.5–15.5)
WBC: 8.3 10*3/uL (ref 4.0–10.5)
nRBC: 0 % (ref 0.0–0.2)

## 2022-04-04 LAB — ECHOCARDIOGRAM COMPLETE
AR max vel: 2.76 cm2
AV Area VTI: 2.08 cm2
AV Area mean vel: 2.66 cm2
AV Mean grad: 2 mmHg
AV Peak grad: 4.6 mmHg
Ao pk vel: 1.07 m/s
Area-P 1/2: 3.46 cm2
Height: 66 in
S' Lateral: 3.1 cm
Weight: 2784 oz

## 2022-04-04 LAB — HEMOGLOBIN A1C
Hgb A1c MFr Bld: 9.7 % — ABNORMAL HIGH (ref 4.8–5.6)
Mean Plasma Glucose: 231.69 mg/dL

## 2022-04-04 LAB — ETHANOL: Alcohol, Ethyl (B): <10 mg/dL (ref <10)

## 2022-04-04 LAB — RAPID URINE DRUG SCREEN, HOSP PERFORMED
Amphetamines: NOT DETECTED
Barbiturates: NOT DETECTED
Benzodiazepines: NOT DETECTED
Cocaine: POSITIVE — AB
Opiates: NOT DETECTED
Tetrahydrocannabinol: NOT DETECTED

## 2022-04-04 LAB — LIPASE, BLOOD: Lipase: 51 U/L (ref 11–51)

## 2022-04-04 LAB — GLUCOSE, CAPILLARY
Glucose-Capillary: 153 mg/dL — ABNORMAL HIGH (ref 70–99)
Glucose-Capillary: 106 mg/dL — ABNORMAL HIGH (ref 70–99)

## 2022-04-04 MED ORDER — INSULIN ASPART 100 UNIT/ML IJ SOLN
0.0000 [IU] | Freq: Every day | INTRAMUSCULAR | Status: DC
  Administered 2022-04-05: 3 [IU] via SUBCUTANEOUS
  Filled 2022-04-04: qty 0.05

## 2022-04-04 MED ORDER — INSULIN GLARGINE-YFGN 100 UNIT/ML ~~LOC~~ SOLN
20.0000 [IU] | Freq: Every day | SUBCUTANEOUS | Status: DC
  Administered 2022-04-05: 20 [IU] via SUBCUTANEOUS
  Filled 2022-04-04 (×2): qty 0.2

## 2022-04-04 MED ORDER — SODIUM CHLORIDE 0.9 % IV SOLN: INTRAVENOUS | Status: DC

## 2022-04-04 MED ORDER — IOHEXOL 300 MG/ML  SOLN
75.0000 mL | Freq: Once | INTRAMUSCULAR | Status: AC | PRN
  Administered 2022-04-04: 75 mL via INTRAVENOUS

## 2022-04-04 MED ORDER — INSULIN ASPART 100 UNIT/ML IJ SOLN
0.0000 [IU] | Freq: Three times a day (TID) | INTRAMUSCULAR | Status: DC
  Administered 2022-04-04: 3 [IU] via SUBCUTANEOUS
  Administered 2022-04-05: 11 [IU] via SUBCUTANEOUS
  Administered 2022-04-05: 5 [IU] via SUBCUTANEOUS
  Administered 2022-04-06: 11 [IU] via SUBCUTANEOUS
  Filled 2022-04-04: qty 0.15

## 2022-04-04 MED ORDER — INSULIN ASPART 100 UNIT/ML IJ SOLN
10.0000 [IU] | Freq: Once | INTRAMUSCULAR | Status: AC
  Administered 2022-04-04: 10 [IU] via SUBCUTANEOUS
  Filled 2022-04-04: qty 0.1

## 2022-04-04 MED ORDER — SODIUM CHLORIDE 0.9 % IV BOLUS
1000.0000 mL | Freq: Once | INTRAVENOUS | Status: AC
  Administered 2022-04-04: 1000 mL via INTRAVENOUS

## 2022-04-04 MED ORDER — ONDANSETRON HCL 4 MG/2ML IJ SOLN
4.0000 mg | Freq: Four times a day (QID) | INTRAMUSCULAR | Status: DC | PRN
  Administered 2022-04-05: 4 mg via INTRAVENOUS
  Filled 2022-04-04: qty 2

## 2022-04-04 MED ORDER — ACETAMINOPHEN 650 MG RE SUPP: 650.0000 mg | Freq: Four times a day (QID) | RECTAL | Status: DC | PRN

## 2022-04-04 MED ORDER — LORAZEPAM 2 MG/ML IJ SOLN
1.0000 mg | Freq: Once | INTRAMUSCULAR | Status: AC
  Administered 2022-04-04: 1 mg via INTRAVENOUS
  Filled 2022-04-04: qty 1

## 2022-04-04 MED ORDER — ONDANSETRON HCL 4 MG PO TABS: 4.0000 mg | ORAL_TABLET | Freq: Four times a day (QID) | ORAL | Status: DC | PRN

## 2022-04-04 MED ORDER — SODIUM CHLORIDE (PF) 0.9 % IJ SOLN
INTRAMUSCULAR | Status: AC
  Filled 2022-04-04: qty 50

## 2022-04-04 MED ORDER — ACETAMINOPHEN 325 MG PO TABS: 650.0000 mg | ORAL_TABLET | Freq: Four times a day (QID) | ORAL | Status: DC | PRN

## 2022-04-04 MED ORDER — SENNOSIDES-DOCUSATE SODIUM 8.6-50 MG PO TABS
1.0000 | ORAL_TABLET | Freq: Every day | ORAL | Status: DC
  Administered 2022-04-04 – 2022-04-05 (×2): 1 via ORAL
  Filled 2022-04-04 (×2): qty 1

## 2022-04-04 MED ORDER — SODIUM CHLORIDE 0.9% FLUSH
3.0000 mL | Freq: Two times a day (BID) | INTRAVENOUS | Status: DC
  Administered 2022-04-04 – 2022-04-06 (×4): 3 mL via INTRAVENOUS

## 2022-04-04 MED ORDER — PANTOPRAZOLE SODIUM 40 MG PO TBEC
40.0000 mg | DELAYED_RELEASE_TABLET | Freq: Every day | ORAL | Status: DC
  Administered 2022-04-04 – 2022-04-06 (×3): 40 mg via ORAL
  Filled 2022-04-04 (×3): qty 1

## 2022-04-04 MED ORDER — HYDRALAZINE HCL 20 MG/ML IJ SOLN
10.0000 mg | Freq: Three times a day (TID) | INTRAMUSCULAR | Status: DC | PRN
  Administered 2022-04-05: 10 mg via INTRAVENOUS
  Filled 2022-04-04: qty 1

## 2022-04-04 NOTE — ED Provider Notes (Signed)
[facility]-EMERGENCY DEPT Provider Note   CSN: [id number] Arrival date & time: 04/04/22  0957     History  Chief Complaint  Patient presents with   Abdominal Pain   Emesis    James Chang is a 56 y.o. male.  HPI 56 year old male with diabetes, presents today complaining of epigastric pain has been present for 2 days.  He states he has had this episodes of this in the past.  He has associated nausea and vomiting.  He denies any GI bleeding.  Has not had any bright red or dark emesis.  He has had multiple episodes of emesis and states this feels that he is likely dehydrated.  He has presented with this in the past and has been thought to be secondary to gastroparesis.  He reports that his blood sugars run high in the 300s most of the time.  He states that he did take a shot of liquor last night that did not help or worsen his symptoms.  He denies regular alcohol intake.     Home Medications Prior to Admission medications   Medication Sig Start Date End Date Taking? Authorizing Provider  atorvastatin (LIPITOR) 40 MG tablet Take 40 mg by mouth daily.    [provider]  FLUoxetine (PROZAC) 10 MG capsule Take 10 mg by mouth daily. Patient not taking: Reported on 12/24/2021 06/29/21   [provider]  gabapentin (NEURONTIN) 100 MG capsule Take 1 capsule (100 mg total) by mouth 3 (three) times daily. Patient not taking: Reported on 10/23/2021 09/09/21   Calvert Cantor, MD  insulin glargine (LANTUS SOLOSTAR) 100 UNIT/ML Solostar Pen Inject 40 Units into the skin at bedtime. 04/24/18   [provider]  insulin lispro (HUMALOG) 100 UNIT/ML injection Inject 5 Units into the skin 3 (three) times daily with meals. Patient not taking: Reported on 10/23/2021 08/06/21   [provider]  losartan (COZAAR) 50 MG tablet Take 50 mg by mouth daily. Patient not taking: Reported on 12/24/2021 05/23/21   [provider]  metoCLOPramide  (REGLAN) 10 MG tablet Take 1 tablet (10 mg total) by mouth every 6 (six) hours. Patient not taking: Reported on 03/27/2022 12/24/21   Melene Plan, DO  ondansetron (ZOFRAN ODT) 8 MG disintegrating tablet Take 1 tablet (8 mg total) by mouth every 8 (eight) hours as needed for nausea or vomiting. 08/31/21   Margarita Grizzle, MD  pantoprazole (PROTONIX) 40 MG tablet Take 40 mg by mouth daily. 08/08/20   [provider]  prochlorperazine (COMPAZINE) 10 MG tablet Take 1 tablet (10 mg total) by mouth 2 (two) times daily as needed for nausea or vomiting. Patient not taking: Reported on 12/24/2021 12/24/21   Arthor Captain, PA-C  promethazine (PHENERGAN) 25 MG suppository Place 1 suppository (25 mg total) rectally every 6 (six) hours as needed for nausea or vomiting. Patient not taking: Reported on 03/27/2022 12/24/21   Melene Plan, DO  sucralfate (CARAFATE) 1 g tablet Take 1 tablet (1 g total) by mouth 4 (four) times daily -  with meals and at bedtime. Patient not taking: Reported on 03/27/2022 12/25/21   Roxy Horseman, PA-C      Allergies    Metformin, Metformin and related, and Reglan [metoclopramide]    Review of Systems   Review of Systems  Physical Exam Updated Vital Signs BP (!) 157/87   Pulse 92   Temp 97.9 F (36.6 C) (Oral)   Resp 14   Ht 1.676 m (5\' 6" )  Wt 78.9 kg   SpO2 100%   BMI 28.08 kg/m  Physical Exam Vitals and nursing note reviewed.  Constitutional:      General: He is not in acute distress.    Appearance: He is well-developed.  HENT:     Head: Normocephalic.     Mouth/Throat:     Mouth: Mucous membranes are moist.  Eyes:     Extraocular Movements: Extraocular movements intact.  Cardiovascular:     Rate and Rhythm: Normal rate and regular rhythm.  Pulmonary:     Effort: Pulmonary effort is normal.  Abdominal:     General: Abdomen is flat. Bowel sounds are decreased.     Palpations: Abdomen is soft.     Tenderness: There is abdominal tenderness in the right  upper quadrant and epigastric area.  Skin:    General: Skin is warm and dry.     Capillary Refill: Capillary refill takes less than 2 seconds.  Neurological:     General: No focal deficit present.     Mental Status: He is alert.    ED Results / Procedures / Treatments   Labs (all labs ordered are listed, but only abnormal results are displayed) Labs Reviewed  COMPREHENSIVE METABOLIC PANEL - Abnormal; Notable for the following components:      Result Value   Sodium 130 (*)    Chloride 93 (*)    Glucose, Bld 370 (*)    BUN 40 (*)    Creatinine, Ser 2.08 (*)    GFR, Estimated 36 (*)    All other components within normal limits  URINALYSIS, ROUTINE W REFLEX MICROSCOPIC - Abnormal; Notable for the following components:   Color, Urine STRAW (*)    Glucose, UA >=500 (*)    Hgb urine dipstick SMALL (*)    Protein, ur 100 (*)    Bacteria, UA RARE (*)    All other components within normal limits  RAPID URINE DRUG SCREEN, HOSP PERFORMED - Abnormal; Notable for the following components:   Cocaine POSITIVE (*)    All other components within normal limits  CBC WITH DIFFERENTIAL/PLATELET  LIPASE, BLOOD  ETHANOL    EKG EKG Interpretation  Date/Time:  Thursday Apr 04 2022 12:36:13 EDT Ventricular Rate:  94 PR Interval:  144 QRS Duration: 87 QT Interval:  337 QTC Calculation: 422 R Axis:   87 Text Interpretation: Sinus rhythm Probable LVH with secondary repol abnrm Confirmed by Margarita Grizzleay, Shakedra Beam [id number]) on 04/04/2022 1:03:56 PM  Radiology CT ABDOMEN PELVIS W CONTRAST  Result Date: 04/04/2022 CLINICAL DATA:  Abdominal pain. EXAM: CT ABDOMEN AND PELVIS WITH CONTRAST TECHNIQUE: Multidetector CT imaging of the abdomen and pelvis was performed using the standard protocol following bolus administration of intravenous contrast. RADIATION DOSE REDUCTION: This exam was performed according to the departmental dose-optimization program which includes automated exposure control, adjustment of the mA  and/or kV according to patient size and/or use of iterative reconstruction technique. CONTRAST:  75mL OMNIPAQUE IOHEXOL 300 MG/ML  SOLN COMPARISON:  CT December 24, 2021 FINDINGS: Lower chest: No acute abnormality. Hepatobiliary: No suspicious hepatic lesion. Gallbladder is unremarkable. No biliary ductal dilation. Pancreas: No pancreatic ductal dilation or evidence of acute inflammation. Spleen: Normal in size without focal abnormality. Adrenals/Urinary Tract: Adrenal glands are unremarkable. Kidneys are normal, without renal calculi, suspicious renal lesion, or hydronephrosis. Bladder is unremarkable. Stomach/Bowel: No radiopaque enteric contrast material was administered. Small hiatal hernia. Stomach is distended with ingested material and gas without overt wall thickening. No pathologic dilation of  small or large bowel. The appendix and terminal ileum appear normal. No evidence of acute bowel inflammation. Moderate volume of formed stool throughout the colon. Vascular/Lymphatic: Aortic and branch vessel atherosclerosis without abdominal aortic aneurysm. No pathologically enlarged abdominal lymph nodes. Reproductive: Mild prostate gland enlargement. Other: No significant abdominopelvic free fluid. Musculoskeletal: Lower lumbar discogenic disease. Degenerative change of the hips. No acute osseous abnormality. IMPRESSION: 1. No acute abdominopelvic findings. 2. Moderate volume of formed stool throughout the colon. Correlate for constipation. 3. Small hiatal hernia. 4.  Aortic Atherosclerosis (ICD10-I70.0). Electronically Signed   By: Maudry Mayhew M.D.   On: 04/04/2022 12:14    Procedures Procedures    Medications Ordered in ED Medications  sodium chloride 0.9 % bolus 1,000 mL (0 mLs Intravenous Stopped 04/04/22 1045)  LORazepam (ATIVAN) injection 1 mg (1 mg Intravenous Given 04/04/22 1031)  sodium chloride (PF) 0.9 % injection (  Given by Other 04/04/22 1148)  iohexol (OMNIPAQUE) 300 MG/ML solution 75 mL  (75 mLs Intravenous Contrast Given 04/04/22 1147)  insulin aspart (novoLOG) injection 10 Units (10 Units Subcutaneous Given 04/04/22 1242)    ED Course/ Medical Decision Making/ A&P Clinical Course as of 04/04/22 1313  Thu Apr 04, 2022  1228 CBC reviewed interpreted normal Complete metabolic panel reviewed interpreted significant for hyperglycemia of 370, elevated BUN at 48 and creatinine elevated 2.08.  Patient has some baseline CKD but this is elevated from prior with first prior creatinine being 1.5  [DR]  1231 CT reviewed and interpreted and no evidence of acute abdominal pelvic findings were noted and radiologist interpretation concurs [DR]    Clinical Course User Index [DR] Margarita Grizzle, MD                           Medical Decision Making 56 year old male history of diabetes, presents today with recurrent abdominal pain.  He states in the past he has been told that this was gastroparesis.  Patient with mild tenderness on exam and appears uncomfortable  Differential diagnosis includes all diseases of the upper abdomen including coronary artery disease, MI, diseases of the lower chest including infiltrate and diseases of the upper abdomen including pancreatitis, gastritis, gallbladder and liver diseases, gastroparesis, dissecting left great vessels 1-abdominal pain patient had CT scan and labs and no definitive surgical or other metabolic etiology noted.  I feel this is most likely a flare of his gastroparesis. 2 patient with acute kidney injury with likely secondary to volume depletion from the vomiting secondary #1 3 hyperglycemia patient appears to have poorly controlled blood sugars most of the time.  He will be treated here with IV fluids and started on insulin 4-with hypertension with known hypertension.  Suspect that he has been unable to take keep his medications down.  Additionally his urine is positive for cocaine 5 cocaine abuse  Amount and/or Complexity of Data  Reviewed Labs: ordered. Decision-making details documented in ED Course. Radiology: ordered and independent interpretation performed. Decision-making details documented in ED Course. Discussion of management or test interpretation with external provider(s): Care discussed with Dr. Ronaldo Miyamoto, on-call for hospitalist that he has assumed care  Risk Prescription drug management. Decision regarding hospitalization.           Final Clinical Impression(s) / ED Diagnoses Final diagnoses:  Nausea and vomiting, unspecified vomiting type  Gastroparesis  AKI (acute kidney injury) (HCC)  Cocaine abuse (HCC)  Hyperglycemia    Rx / DC Orders ED Discharge Orders  None         Margarita Grizzle, MD 04/04/22 1313

## 2022-04-04 NOTE — Progress Notes (Signed)
Echocardiogram 2D Echocardiogram has been performed.  Rodrigo Ran 04/04/2022, 2:27 PM

## 2022-04-04 NOTE — H&P (Signed)
History and Physical    Patient: James Chang NWG:[id number] DOB: [birth year] DOA: 04/04/2022 DOS: the patient was seen and examined on 04/04/2022 PCP: Sharmon Revere, MD  Patient coming from: Home  Chief Complaint:  Chief Complaint  Patient presents with   Abdominal Pain   Emesis   HPI: James Chang is a 56 y.o. male with medical history significant of DM2, HTN, HLD, cocaine abuse, gastroparesis. Presenting with abdominal pain and N/V. 3 days ago he noticed a global stomach cramping. He started having N/V at the same time. He didn't try any medications to help. This continued through this morning. Yesterday morning he believes that he passed out. He says he wasn't doing anything in particular, but he is missing time. He believes he was out for a minute or so. He found himself on the floor, but doesn't believe he hit his head. He says he passed out 3 more times, but none of the episodes where witness. This morning when his symptoms did not improve, he became concerned and came to the ED for help.    Review of Systems: As mentioned in the history of present illness. All other systems reviewed and are negative. Past Medical History:  Diagnosis Date   Depression    Diabetes mellitus without complication (HCC)    History of migraine    Hyperlipidemia    Hypertension    Personal history of drug abuse (HCC)    Stroke (HCC) 2008   Past Surgical History:  Procedure Laterality Date   NO PAST SURGERIES     Social History:  reports that he has never smoked. He has never used smokeless tobacco. He reports current alcohol use. He reports current drug use. Drug: Cocaine.  Allergies  Allergen Reactions   Metformin Diarrhea   Metformin And Related Diarrhea   Reglan [Metoclopramide] Diarrhea    Family History  Problem Relation Age of Onset   Arthritis Mother    Stroke Father    Hypertension Father    Kidney disease Father    Heart attack Father    Cancer Neg Hx    Diabetes Neg  Hx     Prior to Admission medications   Medication Sig Start Date End Date Taking? Authorizing Provider  atorvastatin (LIPITOR) 40 MG tablet Take 40 mg by mouth daily.    [provider]  FLUoxetine (PROZAC) 10 MG capsule Take 10 mg by mouth daily. Patient not taking: Reported on 12/24/2021 06/29/21   [provider]  gabapentin (NEURONTIN) 100 MG capsule Take 1 capsule (100 mg total) by mouth 3 (three) times daily. Patient not taking: Reported on 10/23/2021 09/09/21   Calvert Cantor, MD  insulin glargine (LANTUS SOLOSTAR) 100 UNIT/ML Solostar Pen Inject 40 Units into the skin at bedtime. 04/24/18   [provider]  insulin lispro (HUMALOG) 100 UNIT/ML injection Inject 5 Units into the skin 3 (three) times daily with meals. Patient not taking: Reported on 10/23/2021 08/06/21   [provider]  losartan (COZAAR) 50 MG tablet Take 50 mg by mouth daily. Patient not taking: Reported on 12/24/2021 05/23/21   [provider]  metoCLOPramide (REGLAN) 10 MG tablet Take 1 tablet (10 mg total) by mouth every 6 (six) hours. Patient not taking: Reported on 03/27/2022 12/24/21   Melene Plan, DO  ondansetron (ZOFRAN ODT) 8 MG disintegrating tablet Take 1 tablet (8 mg total) by mouth every 8 (eight) hours as needed for nausea or vomiting. 08/31/21   Margarita Grizzle, MD  pantoprazole (PROTONIX)  40 MG tablet Take 40 mg by mouth daily. 08/08/20   [provider]  prochlorperazine (COMPAZINE) 10 MG tablet Take 1 tablet (10 mg total) by mouth 2 (two) times daily as needed for nausea or vomiting. Patient not taking: Reported on 12/24/2021 12/24/21   Arthor Captain, PA-C  promethazine (PHENERGAN) 25 MG suppository Place 1 suppository (25 mg total) rectally every 6 (six) hours as needed for nausea or vomiting. Patient not taking: Reported on 03/27/2022 12/24/21   Melene Plan, DO  sucralfate (CARAFATE) 1 g tablet Take 1 tablet (1 g total) by mouth 4 (four) times daily -  with  meals and at bedtime. Patient not taking: Reported on 03/27/2022 12/25/21   Roxy Horseman, PA-C    Physical Exam: Vitals:   04/04/22 1030 04/04/22 1035 04/04/22 1100 04/04/22 1130  BP: (!) 162/90  (!) 210/105 (!) 193/90  Pulse: 94 91 94 92  Resp: 13 13 13 15   Temp:      TempSrc:      SpO2: 99% 97% 98% 99%  Weight:      Height:       General: 56 y.o. male resting in bed in NAD Eyes: PERRL, normal sclera ENMT: Nares patent w/o discharge, orophaynx clear, dentition normal, ears w/o discharge/lesions/ulcers Neck: Supple, trachea midline Cardiovascular: RRR, +S1, S2, no m/g/r, equal pulses throughout Respiratory: CTABL, no w/r/r, normal WOB GI: BS+, NDNT, no masses noted, no organomegaly noted MSK: No e/c/c Neuro: A&O x 3, no focal deficits Psyc: Appropriate interaction and affect, calm/cooperative  Data Reviewed:  Na+  130 Cl-  93 Glucose  370 BUN  40 Scr  2.08 WBC  8.9  EKG: sinus, no st elevations  CT ab/pelvis w/ contrast 1. No acute abdominopelvic findings. 2. Moderate volume of formed stool throughout the colon. Correlate for constipation. 3. Small hiatal hernia. 4.  Aortic Atherosclerosis (ICD10-I70.0).  Assessment and Plan: Syncope     - place in obs, tele     - check orthostatics     - EKG is sinus w/o st elevations     - check orthostatics, echo, CTH     - UDS is positive for coke     - fluids, follow  Abdominal pain N/V     - imaging is negative     - he says he has a history of gastroparesis     - looks like he's supposed to be on reglan; but he is not taking it; he has an allergy listed in the system to it     - will have zofran and fluids for now  Cocaine abuse     - admits to $50/day crack use     - counseled against further use  Uncontrolled DM2     - start 20 units semglee     - DM diet, A1c, SSI, glucose checks  AKI on CKD3a     - imaging negative for obstruction     - fluids     - hold ARB, nephrotoxins  HTN     - PRN  hydralazine     - hold home regimen d/t AKI  Hyponatremia     - mild, fluids, follow     Constipation     - bowel regimen  Advance Care Planning:   Code Status: DNI  Consults: None  Family Communication: None at bedside  Severity of Illness: The appropriate patient status for this patient is OBSERVATION. Observation status is judged to be reasonable and necessary in  order to provide the required intensity of service to ensure the patient's safety. The patient's presenting symptoms, physical exam findings, and initial radiographic and laboratory data in the context of their medical condition is felt to place them at decreased risk for further clinical deterioration. Furthermore, it is anticipated that the patient will be medically stable for discharge from the hospital within 2 midnights of admission.   Author: Teddy Spikeyrone A Jonie Burdell, DO 04/04/2022 12:45 PM  For on call review [email or web address].

## 2022-04-05 ENCOUNTER — Observation Stay (HOSPITAL_COMMUNITY): Payer: Medicare Other

## 2022-04-05 DIAGNOSIS — Z888 Allergy status to other drugs, medicaments and biological substances status: Secondary | ICD-10-CM | POA: Diagnosis not present

## 2022-04-05 DIAGNOSIS — E871 Hypo-osmolality and hyponatremia: Secondary | ICD-10-CM

## 2022-04-05 DIAGNOSIS — K3184 Gastroparesis: Secondary | ICD-10-CM

## 2022-04-05 DIAGNOSIS — F1420 Cocaine dependence, uncomplicated: Secondary | ICD-10-CM | POA: Diagnosis not present

## 2022-04-05 DIAGNOSIS — E1165 Type 2 diabetes mellitus with hyperglycemia: Secondary | ICD-10-CM

## 2022-04-05 DIAGNOSIS — K5900 Constipation, unspecified: Secondary | ICD-10-CM | POA: Diagnosis present

## 2022-04-05 DIAGNOSIS — E1143 Type 2 diabetes mellitus with diabetic autonomic (poly)neuropathy: Secondary | ICD-10-CM

## 2022-04-05 DIAGNOSIS — Z841 Family history of disorders of kidney and ureter: Secondary | ICD-10-CM | POA: Diagnosis not present

## 2022-04-05 DIAGNOSIS — Z823 Family history of stroke: Secondary | ICD-10-CM | POA: Diagnosis not present

## 2022-04-05 DIAGNOSIS — I129 Hypertensive chronic kidney disease with stage 1 through stage 4 chronic kidney disease, or unspecified chronic kidney disease: Secondary | ICD-10-CM | POA: Diagnosis present

## 2022-04-05 DIAGNOSIS — I160 Hypertensive urgency: Secondary | ICD-10-CM | POA: Diagnosis present

## 2022-04-05 DIAGNOSIS — Z8249 Family history of ischemic heart disease and other diseases of the circulatory system: Secondary | ICD-10-CM | POA: Diagnosis not present

## 2022-04-05 DIAGNOSIS — N179 Acute kidney failure, unspecified: Secondary | ICD-10-CM | POA: Diagnosis present

## 2022-04-05 DIAGNOSIS — Z794 Long term (current) use of insulin: Secondary | ICD-10-CM | POA: Diagnosis not present

## 2022-04-05 DIAGNOSIS — E785 Hyperlipidemia, unspecified: Secondary | ICD-10-CM | POA: Diagnosis present

## 2022-04-05 DIAGNOSIS — Z79899 Other long term (current) drug therapy: Secondary | ICD-10-CM | POA: Diagnosis not present

## 2022-04-05 DIAGNOSIS — Z8261 Family history of arthritis: Secondary | ICD-10-CM | POA: Diagnosis not present

## 2022-04-05 DIAGNOSIS — E860 Dehydration: Secondary | ICD-10-CM | POA: Diagnosis present

## 2022-04-05 DIAGNOSIS — R55 Syncope and collapse: Secondary | ICD-10-CM | POA: Diagnosis present

## 2022-04-05 DIAGNOSIS — F1410 Cocaine abuse, uncomplicated: Secondary | ICD-10-CM | POA: Diagnosis present

## 2022-04-05 DIAGNOSIS — N1831 Chronic kidney disease, stage 3a: Secondary | ICD-10-CM | POA: Diagnosis present

## 2022-04-05 LAB — CBC
HCT: 42.6 % (ref 39.0–52.0)
Hemoglobin: 14.4 g/dL (ref 13.0–17.0)
MCH: 27.9 pg (ref 26.0–34.0)
MCHC: 33.8 g/dL (ref 30.0–36.0)
MCV: 82.6 fL (ref 80.0–100.0)
Platelets: 269 10*3/uL (ref 150–400)
RBC: 5.16 MIL/uL (ref 4.22–5.81)
RDW: 12.8 % (ref 11.5–15.5)
WBC: 9.3 10*3/uL (ref 4.0–10.5)
nRBC: 0 % (ref 0.0–0.2)

## 2022-04-05 LAB — COMPREHENSIVE METABOLIC PANEL
ALT: 17 U/L (ref 0–44)
AST: 18 U/L (ref 15–41)
Albumin: 4 g/dL (ref 3.5–5.0)
Alkaline Phosphatase: 78 U/L (ref 38–126)
Anion gap: 11 (ref 5–15)
BUN: 35 mg/dL — ABNORMAL HIGH (ref 6–20)
CO2: 22 mmol/L (ref 22–32)
Calcium: 9.8 mg/dL (ref 8.9–10.3)
Chloride: 103 mmol/L (ref 98–111)
Creatinine, Ser: 1.71 mg/dL — ABNORMAL HIGH (ref 0.61–1.24)
GFR, Estimated: 46 mL/min — ABNORMAL LOW (ref >60)
Glucose, Bld: 221 mg/dL — ABNORMAL HIGH (ref 70–99)
Potassium: 4 mmol/L (ref 3.5–5.1)
Sodium: 136 mmol/L (ref 135–145)
Total Bilirubin: 0.7 mg/dL (ref 0.3–1.2)
Total Protein: 7.3 g/dL (ref 6.5–8.1)

## 2022-04-05 LAB — GLUCOSE, CAPILLARY
Glucose-Capillary: 206 mg/dL — ABNORMAL HIGH (ref 70–99)
Glucose-Capillary: 306 mg/dL — ABNORMAL HIGH (ref 70–99)
Glucose-Capillary: 102 mg/dL — ABNORMAL HIGH (ref 70–99)
Glucose-Capillary: 290 mg/dL — ABNORMAL HIGH (ref 70–99)

## 2022-04-05 MED ORDER — CLONIDINE HCL 0.1 MG PO TABS
0.1000 mg | ORAL_TABLET | Freq: Three times a day (TID) | ORAL | Status: DC
  Administered 2022-04-05: 0.1 mg via ORAL
  Filled 2022-04-05: qty 1

## 2022-04-05 MED ORDER — PROCHLORPERAZINE EDISYLATE 10 MG/2ML IJ SOLN
5.0000 mg | Freq: Four times a day (QID) | INTRAMUSCULAR | Status: DC | PRN
  Administered 2022-04-05: 5 mg via INTRAVENOUS
  Filled 2022-04-05: qty 2

## 2022-04-05 MED ORDER — LORAZEPAM 1 MG PO TABS: 1.0000 mg | ORAL_TABLET | ORAL | Status: DC | PRN

## 2022-04-05 MED ORDER — THIAMINE HCL 100 MG/ML IJ SOLN
100.0000 mg | Freq: Every day | INTRAMUSCULAR | Status: DC
  Administered 2022-04-05: 100 mg via INTRAVENOUS
  Filled 2022-04-05: qty 2

## 2022-04-05 MED ORDER — ADULT MULTIVITAMIN W/MINERALS CH
1.0000 | ORAL_TABLET | Freq: Every day | ORAL | Status: DC
  Administered 2022-04-05 – 2022-04-06 (×2): 1 via ORAL
  Filled 2022-04-05 (×2): qty 1

## 2022-04-05 MED ORDER — THIAMINE HCL 100 MG PO TABS
100.0000 mg | ORAL_TABLET | Freq: Every day | ORAL | Status: DC
  Administered 2022-04-06: 100 mg via ORAL
  Filled 2022-04-05: qty 1

## 2022-04-05 MED ORDER — LORAZEPAM 2 MG/ML IJ SOLN
1.0000 mg | INTRAMUSCULAR | Status: DC | PRN
  Administered 2022-04-05: 3 mg via INTRAVENOUS
  Filled 2022-04-05: qty 2

## 2022-04-05 MED ORDER — FOLIC ACID 1 MG PO TABS
1.0000 mg | ORAL_TABLET | Freq: Every day | ORAL | Status: DC
  Administered 2022-04-05 – 2022-04-06 (×2): 1 mg via ORAL
  Filled 2022-04-05 (×2): qty 1

## 2022-04-05 NOTE — Progress Notes (Signed)
TOC consulted for substance use. CSW attempted to meet with pt. Pt drowsy and only opens his eyes for a few seconds. Responded to CSW with mumble then closed his eyes again. TOC will follow up at later time.

## 2022-04-05 NOTE — Discharge Summary (Incomplete)
Discharge Summary  James Chang [id number] DOB: 07/26/1966  PCP: Sharmon Revere, MD  Admit date: 04/04/2022 Discharge date: 04/05/2022  Time spent:   Recommendations for Outpatient Follow-up:  F/u with PCP within a week  for hospital discharge follow up, repeat cbc/bmp at follow up  Discharge Diagnoses:  Active Hospital Problems   Diagnosis Date Noted   Syncope 04/04/2022   Stage 3a chronic kidney disease (CKD) (HCC) 04/04/2022   Hyponatremia 09/09/2021   AKI (acute kidney injury) (HCC) 09/05/2021   Nausea and vomiting 12/26/2019   Constipation 10/28/2019   Uncontrolled type 2 diabetes mellitus with hyperglycemia (HCC) 03/30/2019   Cocaine use disorder, severe, dependence (HCC) 01/14/2019   HTN (hypertension) 09/30/2012   Hyperlipidemia 09/30/2012    Resolved Hospital Problems  No resolved problems to display.    Discharge Condition: stable  Diet recommendation: heart healthy/carb modified  Filed Weights   04/04/22 1004  Weight: 78.9 kg    History of present illness:  [facility] Course:  Principal Problem:   Syncope Active Problems:   Hyperlipidemia   HTN (hypertension)   Cocaine use disorder, severe, dependence (HCC)   Nausea and vomiting   Constipation   Uncontrolled type 2 diabetes mellitus with hyperglycemia (HCC)   AKI (acute kidney injury) (HCC)   Hyponatremia   Stage 3a chronic kidney disease (CKD) (HCC)   Assessment and Plan:  Syncope -by patient's report "he believes that he passed out. He says he wasn't doing anything in particular, but he is missing time. He believes he was out for a minute or so. He found himself on the floor, but doesn't believe he hit his head. He says he passed out 3 more times, but none of the episodes where witness" -Telemetry unremarkable, echocardiogram unremarkable, CT of the head no acute findings         - check orthostatics     -     - UDS is positive for cocaine, he also admit drinking  alcohol - he is found dehydrated and received hydration         Abdominal pain/N/V     - imaging is negative except signs of constipation      - he says he has a history of gastroparesis     - looks like he's supposed to be on reglan; but he is not taking it; he has an allergy listed in the system to it     - will have zofran and fluids , stool regimen -Regular diet  Hyponatremia, likely due to dehydration Sodium normalized after hydration  AKI on CKD 3A Renal function improved after hydration Losartan held on presentation, plan to resume at discharge  Insulin-dependent type 2 diabetes, uncontrolled with hyperglycemia, with history of diabetic gastroparesis A1c 9.7 Encourage diet and medication compliance Follow-up with PCP  Hypertension Avoid cocaine Plan to resume losartan at discharge Follow-up with PCP  Cocaine abuse     - admits to $50/day crack use     - counseled against further use       Procedures: *  Consultations: *  Discharge Exam: BP 134/86 (BP Location: Left Arm)   Pulse 87   Temp 98.6 F (37 C) (Oral)   Resp 18   Ht 5\' 6"  (1.676 m)   Wt 78.9 kg   SpO2 100%   BMI 28.08 kg/m   General: * Cardiovascular: * Respiratory: *     Allergies as of 04/05/2022       Reactions  Metformin Diarrhea   Metformin And Related Diarrhea   Reglan [metoclopramide] Diarrhea     Med Rec must be completed prior to using this SMARTLINK***      Allergies  Allergen Reactions   Metformin Diarrhea   Metformin And Related Diarrhea   Reglan [Metoclopramide] Diarrhea      The results of significant diagnostics from this hospitalization (including imaging, microbiology, ancillary and laboratory) are listed below for reference.    Significant Diagnostic Studies: CT HEAD WO CONTRAST (5MM)  Result Date: 04/04/2022 CLINICAL DATA:  Provided history: Syncope/presyncope, cerebrovascular cause suspected. EXAM: CT HEAD WITHOUT CONTRAST TECHNIQUE: Contiguous  axial images were obtained from the base of the skull through the vertex without intravenous contrast. RADIATION DOSE REDUCTION: This exam was performed according to the departmental dose-optimization program which includes automated exposure control, adjustment of the mA and/or kV according to patient size and/or use of iterative reconstruction technique. COMPARISON:  Prior head CT examinations 09/01/2021 and earlier. FINDINGS: Brain: No age advanced or lobar predominant parenchymal atrophy. There is no acute intracranial hemorrhage. No demarcated cortical infarct. No extra-axial fluid collection. No evidence of an intracranial mass. No midline shift. Vascular: No hyperdense vessel.  Atherosclerotic calcifications. Skull: No fracture or aggressive osseous lesion. Sinuses/Orbits: No mass or acute finding within the imaged orbits. Mild mucosal thickening within the right sphenoid sinus at the imaged levels. IMPRESSION: No evidence of acute intracranial abnormality. Electronically Signed   By: Kellie Simmering D.O.   On: 04/04/2022 16:21   CT ABDOMEN PELVIS W CONTRAST  Result Date: 04/04/2022 CLINICAL DATA:  Abdominal pain. EXAM: CT ABDOMEN AND PELVIS WITH CONTRAST TECHNIQUE: Multidetector CT imaging of the abdomen and pelvis was performed using the standard protocol following bolus administration of intravenous contrast. RADIATION DOSE REDUCTION: This exam was performed according to the departmental dose-optimization program which includes automated exposure control, adjustment of the mA and/or kV according to patient size and/or use of iterative reconstruction technique. CONTRAST:  42mL OMNIPAQUE IOHEXOL 300 MG/ML  SOLN COMPARISON:  CT December 24, 2021 FINDINGS: Lower chest: No acute abnormality. Hepatobiliary: No suspicious hepatic lesion. Gallbladder is unremarkable. No biliary ductal dilation. Pancreas: No pancreatic ductal dilation or evidence of acute inflammation. Spleen: Normal in size without focal  abnormality. Adrenals/Urinary Tract: Adrenal glands are unremarkable. Kidneys are normal, without renal calculi, suspicious renal lesion, or hydronephrosis. Bladder is unremarkable. Stomach/Bowel: No radiopaque enteric contrast material was administered. Small hiatal hernia. Stomach is distended with ingested material and gas without overt wall thickening. No pathologic dilation of small or large bowel. The appendix and terminal ileum appear normal. No evidence of acute bowel inflammation. Moderate volume of formed stool throughout the colon. Vascular/Lymphatic: Aortic and branch vessel atherosclerosis without abdominal aortic aneurysm. No pathologically enlarged abdominal lymph nodes. Reproductive: Mild prostate gland enlargement. Other: No significant abdominopelvic free fluid. Musculoskeletal: Lower lumbar discogenic disease. Degenerative change of the hips. No acute osseous abnormality. IMPRESSION: 1. No acute abdominopelvic findings. 2. Moderate volume of formed stool throughout the colon. Correlate for constipation. 3. Small hiatal hernia. 4.  Aortic Atherosclerosis (ICD10-I70.0). Electronically Signed   By: Dahlia Bailiff M.D.   On: 04/04/2022 12:14   ECHOCARDIOGRAM COMPLETE  Result Date: 04/04/2022    ECHOCARDIOGRAM REPORT   Patient Name:   James Chang Date of Exam: 04/04/2022 Medical Rec #:  [id number]        Height:       66.0 in Accession #:    [id number]       Weight:  174.0 lb Date of Birth:  October 28, 1966        BSA:          1.885 m Patient Age:    56 years         BP:           167/96 mmHg Patient Gender: M                HR:           88 bpm. Exam Location:  Inpatient Procedure: 2D Echo, Saline Contrast Bubble Study, Cardiac Doppler and Color            Doppler Indications:    Syncope  History:        Patient has no prior history of Echocardiogram examinations.                 Risk Factors:Hypertension and Diabetes. Substance abuse.  Sonographer:    Joette Catching RCS Referring Phys:  [id number] [place]  1. Left ventricular ejection fraction, by estimation, is 60 to 65%. The left ventricle has normal function. The left ventricle has no regional wall motion abnormalities. Left ventricular diastolic parameters are consistent with Grade I diastolic dysfunction (impaired relaxation).  2. Right ventricular systolic function is normal. The right ventricular size is normal.  3. The mitral valve is grossly normal. Trivial mitral valve regurgitation.  4. The aortic valve is tricuspid. Aortic valve regurgitation is not visualized.  5. The inferior vena cava is normal in size with greater than 50% respiratory variability, suggesting right atrial pressure of 3 mmHg.  6. Agitated saline contrast bubble study was negative, with no evidence of any interatrial shunt. Comparison(s): No prior Echocardiogram. FINDINGS  Left Ventricle: Left ventricular ejection fraction, by estimation, is 60 to 65%. The left ventricle has normal function. The left ventricle has no regional wall motion abnormalities. The left ventricular internal cavity size was normal in size. There is  no left ventricular hypertrophy. Left ventricular diastolic parameters are consistent with Grade I diastolic dysfunction (impaired relaxation). Indeterminate filling pressures. Right Ventricle: The right ventricular size is normal. No increase in right ventricular wall thickness. Right ventricular systolic function is normal. Left Atrium: Left atrial size was normal in size. Right Atrium: Right atrial size was normal in size. Pericardium: There is no evidence of pericardial effusion. Mitral Valve: The mitral valve is grossly normal. Trivial mitral valve regurgitation. Tricuspid Valve: The tricuspid valve is normal in structure. Tricuspid valve regurgitation is not demonstrated. Aortic Valve: The aortic valve is tricuspid. Aortic valve regurgitation is not visualized. Aortic valve mean gradient measures 2.0 mmHg. Aortic valve peak  gradient measures 4.6 mmHg. Aortic valve area, by VTI measures 2.08 cm. Pulmonic Valve: The pulmonic valve was normal in structure. Pulmonic valve regurgitation is not visualized. Aorta: The aortic root and ascending aorta are structurally normal, with no evidence of dilitation. Venous: The inferior vena cava is normal in size with greater than 50% respiratory variability, suggesting right atrial pressure of 3 mmHg. IAS/Shunts: The interatrial septum is aneurysmal. No atrial level shunt detected by color flow Doppler. Agitated saline contrast was given intravenously to evaluate for intracardiac shunting. Agitated saline contrast bubble study was negative, with no evidence of any interatrial shunt.  LEFT VENTRICLE PLAX 2D LVIDd:         4.70 cm   Diastology LVIDs:         3.10 cm   LV e' medial:    4.97 cm/s LV PW:  1.00 cm   LV E/e' medial:  9.3 LV IVS:        1.00 cm   LV e' lateral:   7.07 cm/s LVOT diam:     2.00 cm   LV E/e' lateral: 6.5 LV SV:         43 LV SV Index:   23 LVOT Area:     3.14 cm  RIGHT VENTRICLE             IVC RV Basal diam:  1.70 cm     IVC diam: 1.40 cm RV Mid diam:    1.70 cm RV S prime:     22.50 cm/s TAPSE (M-mode): 1.6 cm LEFT ATRIUM             Index        RIGHT ATRIUM          Index LA diam:        2.80 cm 1.49 cm/m   RA Area:     7.62 cm LA Vol (A2C):   19.8 ml 10.50 ml/m  RA Volume:   9.83 ml  5.22 ml/m LA Vol (A4C):   19.1 ml 10.13 ml/m LA Biplane Vol: 19.8 ml 10.50 ml/m  AORTIC VALVE                    PULMONIC VALVE AV Area (Vmax):    2.76 cm     PV Vmax:       0.80 m/s AV Area (Vmean):   2.66 cm     PV Peak grad:  2.6 mmHg AV Area (VTI):     2.08 cm AV Vmax:           107.00 cm/s AV Vmean:          71.200 cm/s AV VTI:            0.208 m AV Peak Grad:      4.6 mmHg AV Mean Grad:      2.0 mmHg LVOT Vmax:         94.10 cm/s LVOT Vmean:        60.200 cm/s LVOT VTI:          0.138 m LVOT/AV VTI ratio: 0.66  AORTA Ao Root diam: 3.30 cm Ao Asc diam:  3.10 cm MITRAL  VALVE MV Area (PHT): 3.46 cm     SHUNTS MV Decel Time: 219 msec     Systemic VTI:  0.14 m MV E velocity: 46.00 cm/s   Systemic Diam: 2.00 cm MV A velocity: 104.00 cm/s MV E/A ratio:  0.44 Lyman Bishop MD Electronically signed by Lyman Bishop MD Signature Date/Time: 04/04/2022/4:24:18 PM    Final    OCT, Retina - OU - Both Eyes  Result Date: 03/28/2022 Right Eye Quality was poor. Central Foveal Thickness: 952. Progression has no prior data. Findings include abnormal foveal contour, intraretinal fluid, no SRF, epiretinal membrane, intraretinal hyper-reflective material, preretinal fibrosis (Extensive preretinal fibrosis with tractional edema). Left Eye Quality was good. Central Foveal Thickness: 276. Progression has no prior data. Findings include abnormal foveal contour, intraretinal fluid, intraretinal hyper-reflective material, macular pucker, preretinal fibrosis, epiretinal membrane (Preretinal fibrosis with pucker greatest along temporal arcades). 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 CME - cystoid macular edema. PED - pigment epithelial detachment. IRF -  intraretinal fluid. SRF - subretinal fluid. EZ - ellipsoid zone. ERM - epiretinal membrane. ORA - outer retinal atrophy. ORT - outer retinal tubulation. SRHM - subretinal hyper-reflective material. IRHM - intraretinal hyper-reflective material   Fluorescein Angiography Optos (Transit OD)  Result Date: 03/28/2022 Right Eye Progression has no prior data. Early phase findings include staining, microaneurysm, retinal neovascularization, vascular perfusion defect. Mid/Late phase findings include leakage, staining, microaneurysm, vascular perfusion defect, retinal neovascularization (Florid NVE greatest along temporal arcades).  Left Eye Progression has no prior data. Early phase findings include microaneurysm, vascular perfusion defect, retinal neovascularization, staining. Mid/Late phase findings include leakage, staining, microaneurysm, retinal neovascularization, vascular perfusion defect (Florid NVE greatest along temporal macula). Notes **Images stored on drive** Impression: PDR OU OD: Florid NVE greatest along temporal arcades OS:  Florid NVE greatest along temporal macula    Microbiology: No results found for this or any previous visit (from the past 240 hour(s)).   Labs: Basic Metabolic Panel: Recent Labs  Lab 04/04/22 1032 04/05/22 0555  NA 130* 136  K 3.6 4.0  CL 93* 103  CO2 30 22  GLUCOSE 370* 221*  BUN 40* 35*  CREATININE 2.08* 1.71*  CALCIUM 9.3 9.8   Liver Function Tests: Recent Labs  Lab 04/04/22 1032 04/05/22 0555  AST 15 18  ALT 17 17  ALKPHOS 74 78  BILITOT 1.1 0.7  PROT 7.3 7.3  ALBUMIN 4.0 4.0   Recent Labs  Lab 04/04/22 1032  LIPASE 51   No results for input(s): AMMONIA in the last 168 hours. CBC: Recent Labs  Lab 04/04/22 1032 04/05/22 0555  WBC 8.3 9.3  NEUTROABS 6.7  --   HGB 14.1 14.4  HCT 40.0 42.6  MCV 81.1 82.6  PLT 313 269   Cardiac Enzymes: No results for input(s): CKTOTAL, CKMB, CKMBINDEX, TROPONINI in the last 168 hours. BNP: BNP (last 3 results) No results for input(s): BNP in the last 8760 hours.  ProBNP (last 3 results) No results for input(s): PROBNP in the last 8760 hours.  CBG: Recent Labs  Lab 04/04/22 1653 04/04/22 2209  GLUCAP 153* 106*    FURTHER DISCHARGE INSTRUCTIONS:   Get Medicines reviewed and adjusted: Please take all your medications with you for your next visit with your Primary MD   Laboratory/radiological data: Please request your Primary MD to go over all hospital tests and procedure/radiological results at the follow up, please ask your Primary MD to get all Hospital records sent to his/her office.   In some  cases, they will be blood work, cultures and biopsy results pending at the time of your discharge. Please request that your primary care M.D. goes through all the records of your hospital data and follows up on these results.   Also Note the following: If you experience worsening of your admission symptoms, develop shortness of breath, life threatening emergency, suicidal or homicidal thoughts you must seek medical attention immediately by calling 911 or calling your MD immediately  if symptoms less severe.   You must read complete instructions/literature along with all the possible adverse reactions/side effects for all the Medicines you take and that have been prescribed to you. Take any new Medicines after you have completely understood and accpet all the possible adverse reactions/side effects.    Do not drive when taking Pain medications or sleeping medications (Benzodaizepines)   Do not take more than prescribed Pain, Sleep and Anxiety Medications. It is not advisable to combine anxiety,sleep and pain medications without talking with your primary  care practitioner   Special Instructions: If you have smoked or chewed Tobacco  in the last 2 yrs please stop smoking, stop any regular Alcohol  and or any Recreational drug use.   Wear Seat belts while driving.   Please note: You were cared for by a hospitalist during your hospital stay. Once you are discharged, your primary care physician will handle any further medical issues. Please note that NO REFILLS for any discharge medications will be authorized once you are discharged, as it is imperative that you return to your primary care physician (or establish a relationship with a primary care physician if you do not have one) for your post hospital discharge needs so that they can reassess your need for medications and monitor your lab values.     Signed:  Florencia Reasons MD, PhD, FACP  [facility] 04/05/2022, 7:37 AM

## 2022-04-05 NOTE — Progress Notes (Signed)
   04/05/22 0814  Assess: MEWS Score  Temp 99.6 F (37.6 C)  BP (!) 210/91  Pulse Rate 95  Resp 20  SpO2 100 %  O2 Device Room Air  Assess: MEWS Score  MEWS Temp 0  MEWS Systolic 2  MEWS Pulse 0  MEWS RR 0  MEWS LOC 0  MEWS Score 2  MEWS Score Color Yellow  Assess: if the MEWS score is Yellow or Red  Were vital signs taken at a resting state? No  Focused Assessment Change from prior assessment (see assessment flowsheet)  Does the patient meet 2 or more of the SIRS criteria? No  MEWS guidelines implemented *See Row Information* No, vital signs rechecked  Treat  MEWS Interventions Administered prn meds/treatments  Pain Scale 0-10  Pain Score Asleep  Pain Type Acute pain  Pain Location Abdomen  Pain Orientation Left;Right  Pain Descriptors / Indicators Aching  Pain Frequency Intermittent  Pain Onset On-going  Patients Stated Pain Goal 2  Pain Intervention(s) Medication (See eMAR) (nausea medication given)  Multiple Pain Sites No  Complains of Nausea /  Vomiting  Interventions Medication (see MAR)  Nausea relieved by Antiemetic  Patients response to intervention Unchanged  Take Vital Signs  Increase Vital Sign Frequency  Yellow: Q 2hr X 2 then Q 4hr X 2, if remains yellow, continue Q 4hrs  Escalate  MEWS: Escalate Yellow: discuss with charge nurse/RN and consider discussing with provider and RRT  Notify: Charge Nurse/RN  Name of Charge Nurse/RN Notified Sonne RN  Date Charge Nurse/RN Notified 04/05/22  Time Charge Nurse/RN Notified [phone]  Notify: Provider  Provider Name/Title Dr. Roda Shutters  Date Provider Notified 04/05/22  Time Provider Notified [phone]  Method of Notification  (secure chat)  Notification Reason Change in status  Provider response See new orders  Date of Provider Response 04/05/22  Time of Provider Response [phone]  Notify: Rapid Response  Name of Rapid Response RN Notified  (no)  Document  Patient Outcome Stabilized after interventions  Progress note  created (see row info) Yes  Assess: SIRS CRITERIA  SIRS Temperature  0  SIRS Pulse 1  SIRS Respirations  0  SIRS WBC 0  SIRS Score Sum  1   Patient has not had relief of nausea and stomach pain yet. PRN medications given for nausea/vomiting and SBP>170 per order.

## 2022-04-05 NOTE — Progress Notes (Signed)
PROGRESS NOTE    James Chang  [id number] DOB: 1966/04/02 DOA: 04/04/2022 PCP: Loura Pardon, MD     Brief Narrative:   History of hypertension, hyperlipidemia, insulin-dependent type 2 diabetes, gastroparesis, CKD 3a, cocaine use presented to hospital with abdominal pain nausea and vomiting, and possible syncope episodes  Subjective:  C/o ab pain, not able to tolerate diet   Assessment & Plan:  Principal Problem:   Syncope Active Problems:   Hyperlipidemia   HTN (hypertension)   Cocaine use disorder, severe, dependence (HCC)   Nausea and vomiting   Constipation   Uncontrolled type 2 diabetes mellitus with hyperglycemia ([place])   AKI (acute kidney injury) ([place])   Hyponatremia   Stage 3a chronic kidney disease (CKD) ([place])    Assessment and Plan:  Syncope -by patient's report "he believes that he passed out. He says he wasn't doing anything in particular, but he is missing time. He believes he was out for a minute or so. He found himself on the floor, but doesn't believe he hit his head. He says he passed out 3 more times, but none of the episodes where witness" -Telemetry unremarkable, echocardiogram unremarkable, CT of the head no acute findings  - UDS is positive for cocaine, he also admit drinking alcohol - he is found dehydrated and received hydration         Abdominal pain/N/V     - imaging is negative except signs of constipation      - he says he has a history of gastroparesis     -Report not able to take Reglan due to it makes him crazy     - will have zofran and fluids , stool regimen - will keep n.p.o. except ice chips, sips with meds today    Hyponatremia, likely due to dehydration Sodium normalized after hydration   AKI on CKD 3A Renal function improved after hydration Losartan held on presentation, plan to resume in 1 to 2 days   Insulin-dependent type 2 diabetes, uncontrolled with hyperglycemia, with history of diabetic gastroparesis A1c  9.7 Encourage diet and medication compliance  Hypertension Avoid cocaine Plan to resume losartan at discharge For now will do as needed hydralazine  Follow-up with PCP   Cocaine abuse     - admits to $50/day crack use     - counseled against further use      I have Reviewed nursing notes, Vitals, pain scores, I/o's, Lab results and  imaging results since pt's last encounter, details please see discussion above  I ordered the following labs:  Unresulted Labs (From admission, onward)     Start     Ordered   04/06/22 [id number]  Basic metabolic panel  Tomorrow morning,   R        04/05/22 1605   04/06/22 0500  Magnesium  Tomorrow morning,   R        04/05/22 1605   04/06/22 0500  Lipase, blood  Tomorrow morning,   R        04/05/22 1607             DVT prophylaxis: SCDs Start: 04/04/22 1315   Code Status:   Code Status: Partial Code  Family Communication: Patient Disposition:   Dispo: The patient is from: Home              Anticipated d/c is to: Home              Anticipated d/c date is: When able to  tolerate diet  Antimicrobials:    Anti-infectives (From admission, onward)    None           Objective: Vitals:   04/05/22 0814 04/05/22 0817 04/05/22 1010 04/05/22 1224  BP: (!) 210/91 (!) 197/90 (!) 204/94 101/63  Pulse: 95 94 (!) 104 87  Resp: 20 18 18 16   Temp: 99.6 F (37.6 C)  98.6 F (37 C) 98.2 F (36.8 C)  TempSrc: Oral  Oral Oral  SpO2: 100% 100% 99% 98%  Weight:      Height:        Intake/Output Summary (Last 24 hours) at 04/05/2022 1607 Last data filed at 04/05/2022 [id number] Gross per 24 hour  Intake 240 ml  Output --  Net 240 ml   Filed Weights   04/04/22 1004  Weight: 78.9 kg    Examination:  General exam: alert, awake, communicative,calm, NAD Respiratory system: Clear to auscultation. Respiratory effort normal. Cardiovascular system:  RRR.  Gastrointestinal system: Epigastric tenderness ,abdomen is nondistended,  Normal bowel  sounds heard. Central nervous system: Alert and oriented. No focal neurological deficits. Extremities:  no edema Skin: No rashes, lesions or ulcers Psychiatry: Judgement and insight appear normal. Mood & affect appropriate.     Data Reviewed: I have personally reviewed  labs and visualized  imaging studies since the last encounter and formulate the plan        Scheduled Meds:  folic acid  1 mg Oral Daily   insulin aspart  0-15 Units Subcutaneous TID WC   insulin aspart  0-5 Units Subcutaneous QHS   insulin glargine-yfgn  20 Units Subcutaneous QHS   multivitamin with minerals  1 tablet Oral Daily   pantoprazole  40 mg Oral Daily   senna-docusate  1 tablet Oral QHS   sodium chloride flush  3 mL Intravenous Q12H   thiamine  100 mg Oral Daily   Or   thiamine  100 mg Intravenous Daily   Continuous Infusions:  sodium chloride 100 mL/hr at 04/05/22 0559     LOS: 0 days      Florencia Reasons, MD PhD FACP [facility]  Available via Epic secure chat 7am-7pm for nonurgent issues Please page for urgent issues To page the attending provider between 7A-7P or the covering provider during after hours 7P-7A, please log into the web site [email or web address] and access using universal [place] password for that web site. If you do not have the password, please call the hospital operator.    04/05/2022, 4:07 PM

## 2022-04-05 NOTE — Progress Notes (Signed)
Inpatient Diabetes Program Recommendations  AACE/ADA: New Consensus Statement on Inpatient Glycemic Control (2015)  Target Ranges:  Prepandial:   less than 140 mg/dL      Peak postprandial:   less than 180 mg/dL (1-2 hours)      Critically ill patients:  140 - 180 mg/dL   Lab Results  Component Value Date   GLUCAP 306 (H) 04/05/2022   HGBA1C 9.7 (H) 04/04/2022    Review of Glycemic Control  Diabetes history: DM2 Outpatient Diabetes medications: Lantus 40 QD, Jardiance 25 QD Current orders for Inpatient glycemic control: Novolog 0-15 TID with meals and 0-5 HS, Semglee 20 QHS  HgbA1C - 9.7% NPO except ice chips  Inpatient Diabetes Program Recommendations:    Change Novolog to 0-15 Q4H Consider increasing Semglee to 25 units QHS When po's begin and pt is eating > 50%, consider adding Novolog 3-4 units TID  Will continue to follow.  Thank you. Ailene Ards, RD, LDN, CDE Inpatient Diabetes Coordinator [phone]

## 2022-04-06 DIAGNOSIS — N1831 Chronic kidney disease, stage 3a: Secondary | ICD-10-CM

## 2022-04-06 LAB — BASIC METABOLIC PANEL
Anion gap: 7 (ref 5–15)
BUN: 30 mg/dL — ABNORMAL HIGH (ref 6–20)
CO2: 28 mmol/L (ref 22–32)
Calcium: 9.3 mg/dL (ref 8.9–10.3)
Chloride: 100 mmol/L (ref 98–111)
Creatinine, Ser: 1.74 mg/dL — ABNORMAL HIGH (ref 0.61–1.24)
GFR, Estimated: 45 mL/min — ABNORMAL LOW (ref >60)
Glucose, Bld: 221 mg/dL — ABNORMAL HIGH (ref 70–99)
Potassium: 3.6 mmol/L (ref 3.5–5.1)
Sodium: 135 mmol/L (ref 135–145)

## 2022-04-06 LAB — GLUCOSE, CAPILLARY: Glucose-Capillary: 301 mg/dL — ABNORMAL HIGH (ref 70–99)

## 2022-04-06 LAB — MAGNESIUM: Magnesium: 2.4 mg/dL (ref 1.7–2.4)

## 2022-04-06 LAB — LIPASE, BLOOD: Lipase: 44 U/L (ref 11–51)

## 2022-04-06 NOTE — Discharge Summary (Signed)
Discharge Summary  James Chang [id number] DOB: 09-02-1966  PCP: Loura Pardon, MD  Admit date: 04/04/2022 Discharge date: 04/06/2022  Time spent: 53mins  Recommendations for Outpatient Follow-up:  F/u with PCP on 5/31  for hospital discharge follow up, repeat cbc/bmp at follow up   Discharge Diagnoses:  Active Hospital Problems   Diagnosis Date Noted   Syncope 04/04/2022   Stage 3a chronic kidney disease (CKD) ([place]) 04/04/2022   Hyponatremia 09/09/2021   AKI (acute kidney injury) ([place]) 09/05/2021   Nausea and vomiting 12/26/2019   Constipation 10/28/2019   Uncontrolled type 2 diabetes mellitus with hyperglycemia ([place]) 03/30/2019   Cocaine use disorder, severe, dependence ([place]) 01/14/2019   HTN (hypertension) 09/30/2012   Hyperlipidemia 09/30/2012    Resolved Hospital Problems  No resolved problems to display.    Discharge Condition: stable  Diet recommendation: heart healthy/carb modified  Filed Weights   04/04/22 1004 04/06/22 0500  Weight: 78.9 kg 80.5 kg    History of present illness: ( per admitting provider Dr Marylyn Ishihara) James Chang is a 56 y.o. male with medical history significant of DM2, HTN, HLD, cocaine abuse, gastroparesis. Presenting with abdominal pain and N/V. 3 days ago he noticed a global stomach cramping. He started having N/V at the same time. He didn't try any medications to help. This continued through this morning. Yesterday morning he believes that he passed out. He says he wasn't doing anything in particular, but he is missing time. He believes he was out for a minute or so. He found himself on the floor, but doesn't believe he hit his head. He says he passed out 3 more times, but none of the episodes where witness. This morning when his symptoms did not improve, he became concerned and came to the ED for help.    Hospital Course:  Principal Problem:   Syncope Active Problems:   Hyperlipidemia   HTN (hypertension)   Cocaine use  disorder, severe, dependence ([facility])   Nausea and vomiting   Constipation   Uncontrolled type 2 diabetes mellitus with hyperglycemia ([facility])   AKI (acute kidney injury) ([place])   Hyponatremia   Stage 3a chronic kidney disease (CKD) ([facility])   Assessment and Plan:   Syncope -by patient's report "he believes that he passed out. He says he wasn't doing anything in particular, but he is missing time. He believes he was out for a minute or so. He found himself on the floor, but doesn't believe he hit his head. He says he passed out 3 more times, but none of the episodes where witness" -Telemetry unremarkable, echocardiogram unremarkable, CT of the head no acute findings  - UDS is positive for cocaine, he also admit drinking alcohol - he is found dehydrated and received hydration  -resolved, demand to go home F/u with pcp        Abdominal pain/N/V     - imaging is negative except signs of constipation      - he says he has a history of gastroparesis     -Report not able to take Reglan due to it makes him crazy and violent      - reports symptom resolved, wants to go home -f/u with pcp and GI      Hyponatremia, likely due to dehydration Sodium normalized after hydration   AKI on CKD 3A Renal function improved after hydration Losartan held on presentation,  resumed at discharge F/u with pcp   Insulin-dependent type 2 diabetes, uncontrolled with hyperglycemia, with history  of diabetic gastroparesis A1c 9.7 Encourage diet and medication compliance F/u with pcp   Hypertension With hypertension urgency on presentation required prn hydralazine and a few dose of clonidine  Avoid cocaine  resume losartan at discharge Follow-up with PCP   Cocaine abuse     - admits to $50/day crack use     - counseled against further use    Discharge Exam: BP (!) 154/107 (BP Location: Left Arm)   Pulse 82   Temp 98.3 F (36.8 C) (Oral)   Resp 15   Ht 5\' 6"  (1.676 m)   Wt 80.5 kg   SpO2 100%    BMI 28.63 kg/m   General: NAD Cardiovascular: RRR Respiratory: normal respiratory effort     Discharge Instructions     Diet - low sodium heart healthy   Complete by: As directed    Carb modified diet   Increase activity slowly   Complete by: As directed       Allergies as of 04/06/2022       Reactions   Metformin Diarrhea   Metformin And Related Diarrhea   Reglan [metoclopramide] Diarrhea        Medication List     STOP taking these medications    doxycycline 100 MG capsule Commonly known as: MONODOX   metoCLOPramide 10 MG tablet Commonly known as: REGLAN   sucralfate 1 g tablet Commonly known as: Carafate       TAKE these medications    Abilify Maintena 400 MG Prsy prefilled syringe Generic drug: ARIPiprazole ER Inject 1 each into the muscle every 30 (thirty) days.   atorvastatin 40 MG tablet Commonly known as: LIPITOR Take 40 mg by mouth daily.   busPIRone 15 MG tablet Commonly known as: BUSPAR Take 15 mg by mouth 2 (two) times daily.   diclofenac Sodium 1 % Gel Commonly known as: VOLTAREN Apply 1 application. topically daily as needed (For pain).   DULoxetine 20 MG capsule Commonly known as: CYMBALTA Take 20 mg by mouth daily.   gabapentin 300 MG capsule Commonly known as: NEURONTIN Take 300 mg by mouth daily as needed (For back pain). What changed: Another medication with the same name was removed. Continue taking this medication, and follow the directions you see here.   Jardiance 25 MG Tabs tablet Generic drug: empagliflozin Take 25 mg by mouth daily.   Lantus SoloStar 100 UNIT/ML Solostar Pen Generic drug: insulin glargine Inject 40 Units into the skin daily.   Linzess 145 MCG Caps capsule Generic drug: linaclotide Take 145 mcg by mouth daily.   losartan 50 MG tablet Commonly known as: COZAAR Take 50 mg by mouth daily. What changed: Another medication with the same name was removed. Continue taking this medication, and  follow the directions you see here.   ondansetron 8 MG disintegrating tablet Commonly known as: Zofran ODT Take 1 tablet (8 mg total) by mouth every 8 (eight) hours as needed for nausea or vomiting.   pantoprazole 40 MG tablet Commonly known as: PROTONIX Take 40 mg by mouth daily.   prochlorperazine 10 MG tablet Commonly known as: COMPAZINE Take 1 tablet (10 mg total) by mouth 2 (two) times daily as needed for nausea or vomiting.   promethazine 25 MG suppository Commonly known as: PHENERGAN Place 1 suppository (25 mg total) rectally every 6 (six) hours as needed for nausea or vomiting.   tiZANidine 4 MG tablet Commonly known as: ZANAFLEX Take 4 mg by mouth 3 (three) times daily.  Allergies  Allergen Reactions   Metformin Diarrhea   Metformin And Related Diarrhea   Reglan [Metoclopramide] Diarrhea    Follow-up Information     Paliwal, Himanshu, MD Follow up in 1 week(s).   Specialty: Family Medicine Why: hospital discharge follow up, repeat cbc/bmp Contact information: [place] [address] [phone]         Maretta Los, PA-C Follow up.   Specialty: Gastroenterology Why: for gastroparsis Contact information: [address] [id number] [place] [address] [phone]                  The results of significant diagnostics from this hospitalization (including imaging, microbiology, ancillary and laboratory) are listed below for reference.    Significant Diagnostic Studies: DG Abd 1 View  Result Date: 04/05/2022 CLINICAL DATA:  Nausea and vomiting. EXAM: ABDOMEN - 1 VIEW COMPARISON:  CT abdomen pelvis from yesterday. Abdominal x-ray dated January 22, 2022. FINDINGS: The bowel gas pattern is normal. No radio-opaque calculi or other significant radiographic abnormality are seen. IMPRESSION: Negative. Electronically Signed   By: Titus Dubin M.D.   On: 04/05/2022 09:26   CT HEAD WO CONTRAST (5MM)  Result Date:  04/04/2022 CLINICAL DATA:  Provided history: Syncope/presyncope, cerebrovascular cause suspected. EXAM: CT HEAD WITHOUT CONTRAST TECHNIQUE: Contiguous axial images were obtained from the base of the skull through the vertex without intravenous contrast. RADIATION DOSE REDUCTION: This exam was performed according to the departmental dose-optimization program which includes automated exposure control, adjustment of the mA and/or kV according to patient size and/or use of iterative reconstruction technique. COMPARISON:  Prior head CT examinations 09/01/2021 and earlier. FINDINGS: Brain: No age advanced or lobar predominant parenchymal atrophy. There is no acute intracranial hemorrhage. No demarcated cortical infarct. No extra-axial fluid collection. No evidence of an intracranial mass. No midline shift. Vascular: No hyperdense vessel.  Atherosclerotic calcifications. Skull: No fracture or aggressive osseous lesion. Sinuses/Orbits: No mass or acute finding within the imaged orbits. Mild mucosal thickening within the right sphenoid sinus at the imaged levels. IMPRESSION: No evidence of acute intracranial abnormality. Electronically Signed   By: Kellie Simmering D.O.   On: 04/04/2022 16:21   CT ABDOMEN PELVIS W CONTRAST  Result Date: 04/04/2022 CLINICAL DATA:  Abdominal pain. EXAM: CT ABDOMEN AND PELVIS WITH CONTRAST TECHNIQUE: Multidetector CT imaging of the abdomen and pelvis was performed using the standard protocol following bolus administration of intravenous contrast. RADIATION DOSE REDUCTION: This exam was performed according to the departmental dose-optimization program which includes automated exposure control, adjustment of the mA and/or kV according to patient size and/or use of iterative reconstruction technique. CONTRAST:  13mL OMNIPAQUE IOHEXOL 300 MG/ML  SOLN COMPARISON:  CT December 24, 2021 FINDINGS: Lower chest: No acute abnormality. Hepatobiliary: No suspicious hepatic lesion. Gallbladder is  unremarkable. No biliary ductal dilation. Pancreas: No pancreatic ductal dilation or evidence of acute inflammation. Spleen: Normal in size without focal abnormality. Adrenals/Urinary Tract: Adrenal glands are unremarkable. Kidneys are normal, without renal calculi, suspicious renal lesion, or hydronephrosis. Bladder is unremarkable. Stomach/Bowel: No radiopaque enteric contrast material was administered. Small hiatal hernia. Stomach is distended with ingested material and gas without overt wall thickening. No pathologic dilation of small or large bowel. The appendix and terminal ileum appear normal. No evidence of acute bowel inflammation. Moderate volume of formed stool throughout the colon. Vascular/Lymphatic: Aortic and branch vessel atherosclerosis without abdominal aortic aneurysm. No pathologically enlarged abdominal lymph nodes. Reproductive: Mild prostate gland enlargement. Other: No significant abdominopelvic free  fluid. Musculoskeletal: Lower lumbar discogenic disease. Degenerative change of the hips. No acute osseous abnormality. IMPRESSION: 1. No acute abdominopelvic findings. 2. Moderate volume of formed stool throughout the colon. Correlate for constipation. 3. Small hiatal hernia. 4.  Aortic Atherosclerosis (ICD10-I70.0). Electronically Signed   By: Dahlia Bailiff M.D.   On: 04/04/2022 12:14   ECHOCARDIOGRAM COMPLETE  Result Date: 04/04/2022    ECHOCARDIOGRAM REPORT   Patient Name:   James Chang Date of Exam: 04/04/2022 Medical Rec #:  [id number]        Height:       66.0 in Accession #:    [id number]       Weight:       174.0 lb Date of Birth:  1966/08/18        BSA:          1.885 m Patient Age:    42 years         BP:           167/96 mmHg Patient Gender: M                HR:           88 bpm. Exam Location:  Inpatient Procedure: 2D Echo, Saline Contrast Bubble Study, Cardiac Doppler and Color            Doppler Indications:    Syncope  History:        Patient has no prior history of  Echocardiogram examinations.                 Risk Factors:Hypertension and Diabetes. Substance abuse.  Sonographer:    Joette Catching RCS Referring Phys: [id number] [place]  1. Left ventricular ejection fraction, by estimation, is 60 to 65%. The left ventricle has normal function. The left ventricle has no regional wall motion abnormalities. Left ventricular diastolic parameters are consistent with Grade I diastolic dysfunction (impaired relaxation).  2. Right ventricular systolic function is normal. The right ventricular size is normal.  3. The mitral valve is grossly normal. Trivial mitral valve regurgitation.  4. The aortic valve is tricuspid. Aortic valve regurgitation is not visualized.  5. The inferior vena cava is normal in size with greater than 50% respiratory variability, suggesting right atrial pressure of 3 mmHg.  6. Agitated saline contrast bubble study was negative, with no evidence of any interatrial shunt. Comparison(s): No prior Echocardiogram. FINDINGS  Left Ventricle: Left ventricular ejection fraction, by estimation, is 60 to 65%. The left ventricle has normal function. The left ventricle has no regional wall motion abnormalities. The left ventricular internal cavity size was normal in size. There is  no left ventricular hypertrophy. Left ventricular diastolic parameters are consistent with Grade I diastolic dysfunction (impaired relaxation). Indeterminate filling pressures. Right Ventricle: The right ventricular size is normal. No increase in right ventricular wall thickness. Right ventricular systolic function is normal. Left Atrium: Left atrial size was normal in size. Right Atrium: Right atrial size was normal in size. Pericardium: There is no evidence of pericardial effusion. Mitral Valve: The mitral valve is grossly normal. Trivial mitral valve regurgitation. Tricuspid Valve: The tricuspid valve is normal in structure. Tricuspid valve regurgitation is not demonstrated.  Aortic Valve: The aortic valve is tricuspid. Aortic valve regurgitation is not visualized. Aortic valve mean gradient measures 2.0 mmHg. Aortic valve peak gradient measures 4.6 mmHg. Aortic valve area, by VTI measures 2.08 cm. Pulmonic Valve: The pulmonic valve was normal in structure. Pulmonic valve regurgitation  is not visualized. Aorta: The aortic root and ascending aorta are structurally normal, with no evidence of dilitation. Venous: The inferior vena cava is normal in size with greater than 50% respiratory variability, suggesting right atrial pressure of 3 mmHg. IAS/Shunts: The interatrial septum is aneurysmal. No atrial level shunt detected by color flow Doppler. Agitated saline contrast was given intravenously to evaluate for intracardiac shunting. Agitated saline contrast bubble study was negative, with no evidence of any interatrial shunt.  LEFT VENTRICLE PLAX 2D LVIDd:         4.70 cm   Diastology LVIDs:         3.10 cm   LV e' medial:    4.97 cm/s LV PW:         1.00 cm   LV E/e' medial:  9.3 LV IVS:        1.00 cm   LV e' lateral:   7.07 cm/s LVOT diam:     2.00 cm   LV E/e' lateral: 6.5 LV SV:         43 LV SV Index:   23 LVOT Area:     3.14 cm  RIGHT VENTRICLE             IVC RV Basal diam:  1.70 cm     IVC diam: 1.40 cm RV Mid diam:    1.70 cm RV S prime:     22.50 cm/s TAPSE (M-mode): 1.6 cm LEFT ATRIUM             Index        RIGHT ATRIUM          Index LA diam:        2.80 cm 1.49 cm/m   RA Area:     7.62 cm LA Vol (A2C):   19.8 ml 10.50 ml/m  RA Volume:   9.83 ml  5.22 ml/m LA Vol (A4C):   19.1 ml 10.13 ml/m LA Biplane Vol: 19.8 ml 10.50 ml/m  AORTIC VALVE                    PULMONIC VALVE AV Area (Vmax):    2.76 cm     PV Vmax:       0.80 m/s AV Area (Vmean):   2.66 cm     PV Peak grad:  2.6 mmHg AV Area (VTI):     2.08 cm AV Vmax:           107.00 cm/s AV Vmean:          71.200 cm/s AV VTI:            0.208 m AV Peak Grad:      4.6 mmHg AV Mean Grad:      2.0 mmHg LVOT Vmax:          94.10 cm/s LVOT Vmean:        60.200 cm/s LVOT VTI:          0.138 m LVOT/AV VTI ratio: 0.66  AORTA Ao Root diam: 3.30 cm Ao Asc diam:  3.10 cm MITRAL VALVE MV Area (PHT): 3.46 cm     SHUNTS MV Decel Time: 219 msec     Systemic VTI:  0.14 m MV E velocity: 46.00 cm/s   Systemic Diam: 2.00 cm MV A velocity: 104.00 cm/s MV E/A ratio:  0.44 Lyman Bishop MD Electronically signed by Lyman Bishop MD Signature Date/Time: 04/04/2022/4:24:18 PM    Final    OCT, Retina - OU -  Both Eyes  Result Date: 03/28/2022 Right Eye Quality was poor. Central Foveal Thickness: 952. Progression has no prior data. Findings include abnormal foveal contour, intraretinal fluid, no SRF, epiretinal membrane, intraretinal hyper-reflective material, preretinal fibrosis (Extensive preretinal fibrosis with tractional edema). Left Eye Quality was good. Central Foveal Thickness: 276. Progression has no prior data. Findings include abnormal foveal contour, intraretinal fluid, intraretinal hyper-reflective material, macular pucker, preretinal fibrosis, epiretinal membrane (Preretinal fibrosis with pucker greatest along temporal arcades). 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 CME - cystoid macular edema. PED - pigment epithelial detachment. IRF - intraretinal fluid. SRF - subretinal fluid. EZ - ellipsoid zone. ERM - epiretinal membrane. ORA - outer retinal atrophy. ORT - outer retinal tubulation. SRHM - subretinal hyper-reflective material. IRHM - intraretinal hyper-reflective material   Fluorescein Angiography Optos (Transit OD)  Result Date: 03/28/2022 Right Eye Progression has no prior data. Early phase findings include staining, microaneurysm, retinal neovascularization, vascular perfusion defect. Mid/Late phase  findings include leakage, staining, microaneurysm, vascular perfusion defect, retinal neovascularization ([place] NVE greatest along temporal arcades). Left Eye Progression has no prior data. Early phase findings include microaneurysm, vascular perfusion defect, retinal neovascularization, staining. Mid/Late phase findings include leakage, staining, microaneurysm, retinal neovascularization, vascular perfusion defect ([place] NVE greatest along temporal macula). Notes **Images stored on drive** Impression: PDR OU OD: Florid NVE greatest along temporal arcades OS:  Florid NVE greatest along temporal macula    Microbiology: No results found for this or any previous visit (from the past 240 hour(s)).   Labs: Basic Metabolic Panel: Recent Labs  Lab 04/04/22 1032 04/05/22 0555 04/06/22 0542  NA 130* 136 135  K 3.6 4.0 3.6  CL 93* 103 100  CO2 30 22 28   GLUCOSE 370* 221* 221*  BUN 40* 35* 30*  CREATININE 2.08* 1.71* 1.74*  CALCIUM 9.3 9.8 9.3  MG  --   --  2.4   Liver Function Tests: Recent Labs  Lab 04/04/22 1032 04/05/22 0555  AST 15 18  ALT 17 17  ALKPHOS 74 78  BILITOT 1.1 0.7  PROT 7.3 7.3  ALBUMIN 4.0 4.0   Recent Labs  Lab 04/04/22 1032 04/06/22 0542  LIPASE 51 44   No results for input(s): AMMONIA in the last 168 hours. CBC: Recent Labs  Lab 04/04/22 1032 04/05/22 0555  WBC 8.3 9.3  NEUTROABS 6.7  --   HGB 14.1 14.4  HCT 40.0 42.6  MCV 81.1 82.6  PLT 313 269   Cardiac Enzymes: No results for input(s): CKTOTAL, CKMB, CKMBINDEX, TROPONINI in the last 168 hours. BNP: BNP (last 3 results) No results for input(s): BNP in the last 8760 hours.  ProBNP (last 3 results) No results for input(s): PROBNP in the last 8760 hours.  CBG: Recent Labs  Lab 04/05/22 0736 04/05/22 1136 04/05/22 1628 04/05/22 2101 04/06/22 0728  GLUCAP 206* 306* 102* 290* 301*    FURTHER DISCHARGE INSTRUCTIONS:   Get Medicines reviewed and adjusted: Please take all your  medications with you for your next visit with your Primary MD   Laboratory/radiological data: Please request your Primary MD to go over all hospital tests and procedure/radiological results at the follow up, please ask your Primary MD to get all Hospital records sent to his/her office.   In some cases, they will be blood work, cultures and biopsy  results pending at the time of your discharge. Please request that your primary care M.D. goes through all the records of your hospital data and follows up on these results.   Also Note the following: If you experience worsening of your admission symptoms, develop shortness of breath, life threatening emergency, suicidal or homicidal thoughts you must seek medical attention immediately by calling 911 or calling your MD immediately  if symptoms less severe.   You must read complete instructions/literature along with all the possible adverse reactions/side effects for all the Medicines you take and that have been prescribed to you. Take any new Medicines after you have completely understood and accpet all the possible adverse reactions/side effects.    Do not drive when taking Pain medications or sleeping medications (Benzodaizepines)   Do not take more than prescribed Pain, Sleep and Anxiety Medications. It is not advisable to combine anxiety,sleep and pain medications without talking with your primary care practitioner   Special Instructions: If you have smoked or chewed Tobacco  in the last 2 yrs please stop smoking, stop any regular Alcohol  and or any Recreational drug use.   Wear Seat belts while driving.   Please note: You were cared for by a hospitalist during your hospital stay. Once you are discharged, your primary care physician will handle any further medical issues. Please note that NO REFILLS for any discharge medications will be authorized once you are discharged, as it is imperative that you return to your primary care physician (or  establish a relationship with a primary care physician if you do not have one) for your post hospital discharge needs so that they can reassess your need for medications and monitor your lab values.     Signed:  Florencia Reasons MD, PhD, FACP  [facility] 04/06/2022, 9:51 AM

## 2022-04-22 NOTE — Progress Notes (Signed)
[facility] Note  04/24/2022     CHIEF COMPLAINT Patient presents for Retina Follow Up   HISTORY OF PRESENT ILLNESS: James Chang is a 56 y.o. male who presents to the clinic today for:   HPI     Retina Follow Up   Patient presents with  Diabetic Retinopathy.  In both eyes.  This started years ago.  Duration of 4 weeks.  Since onset it is stable.  I, the attending physician,  performed the HPI with the patient and updated documentation appropriately.        Comments   Patient feels that his vision is the same. He states that he sees floaters in both eyes. His blood sugar was 256 yesterday at the doctors office and his A1c is 9.5.      Last edited by Bernarda Caffey, MD on 04/24/2022 11:39 AM.    Delayed to follow up 1 month instead of 1 week.  Patient states he was sick.   Referring physician: Loura Pardon, MD [place],  [place] [address]  HISTORICAL INFORMATION:   Selected notes from the MEDICAL RECORD NUMBER Referred by Dr. Dorian Pod for DM exam Ocular Hx- PDR s/p PRP OU ([facility]) PMH- Diabetic    CURRENT MEDICATIONS: No current outpatient medications on file. (Ophthalmic Drugs)   No current facility-administered medications for this visit. (Ophthalmic Drugs)   Current Outpatient Medications (Other)  Medication Sig   ABILIFY MAINTENA 400 MG PRSY prefilled syringe Inject 1 each into the muscle every 30 (thirty) days.   atorvastatin (LIPITOR) 40 MG tablet Take 40 mg by mouth daily.   busPIRone (BUSPAR) 15 MG tablet Take 15 mg by mouth 2 (two) times daily.   diclofenac Sodium (VOLTAREN) 1 % GEL Apply 1 application. topically daily as needed (For pain).   DULoxetine (CYMBALTA) 20 MG capsule Take 20 mg by mouth daily.   gabapentin (NEURONTIN) 300 MG capsule Take 300 mg by mouth daily as needed (For back pain).   insulin glargine (LANTUS SOLOSTAR) 100 UNIT/ML Solostar Pen Inject 40 Units into the skin daily.   JARDIANCE 25  MG TABS tablet Take 25 mg by mouth daily.   LINZESS 145 MCG CAPS capsule Take 145 mcg by mouth daily.   losartan (COZAAR) 50 MG tablet Take 50 mg by mouth daily.   ondansetron (ZOFRAN ODT) 8 MG disintegrating tablet Take 1 tablet (8 mg total) by mouth every 8 (eight) hours as needed for nausea or vomiting.   pantoprazole (PROTONIX) 40 MG tablet Take 40 mg by mouth daily.   prochlorperazine (COMPAZINE) 10 MG tablet Take 1 tablet (10 mg total) by mouth 2 (two) times daily as needed for nausea or vomiting.   promethazine (PHENERGAN) 25 MG suppository Place 1 suppository (25 mg total) rectally every 6 (six) hours as needed for nausea or vomiting.   tiZANidine (ZANAFLEX) 4 MG tablet Take 4 mg by mouth 3 (three) times daily.   No current facility-administered medications for this visit. (Other)   REVIEW OF SYSTEMS: ROS   Positive for: Endocrine, Eyes Last edited by Annie Paras, COT on 04/24/2022  9:19 AM.      ALLERGIES Allergies  Allergen Reactions   Metformin Diarrhea   Metformin And Related Diarrhea   Reglan [Metoclopramide] Diarrhea   PAST MEDICAL HISTORY Past Medical History:  Diagnosis Date   Depression    Diabetes mellitus without complication ([place])    History of migraine    Hyperlipidemia  Hypertension    Personal history of drug abuse ([place])    Stroke ([place]) 2008   Past Surgical History:  Procedure Laterality Date   NO PAST SURGERIES     FAMILY HISTORY Family History  Problem Relation Age of Onset   Arthritis Mother    Stroke Father    Hypertension Father    Kidney disease Father    Heart attack Father    Cancer Neg Hx    Diabetes Neg Hx    SOCIAL HISTORY Social History   Tobacco Use   Smoking status: Never   Smokeless tobacco: Never  Vaping Use   Vaping Use: Never used  Substance Use Topics   Alcohol use: Not Currently    Comment: seldom 1-2 times a month beer   Drug use: Yes    Types: Cocaine, "Crack" cocaine    Comment: "a couple days  ago"       OPHTHALMIC EXAM:  Base Eye Exam     Visual Acuity (Snellen - Linear)       Right Left   Dist cc 20/70 20/25   Dist ph cc NI     Correction: Glasses         Tonometry (Tonopen, 9:24 AM)       Right Left   Pressure 13 8         Pupils       Dark Light Shape React APD   Right 2 1 Round Minimal None   Left 3 2 Round Minimal None         Visual Fields       Left Right   Restrictions Partial outer inferior temporal deficiency Partial outer superior temporal, inferior temporal, superior nasal, inferior nasal deficiencies         Extraocular Movement       Right Left    Full, Ortho Full, Ortho         Neuro/Psych     Oriented x3: Yes   Mood/Affect: Normal         Dilation     Both eyes: 1.0% Mydriacyl, 2.5% Phenylephrine @ 9:23 AM           Slit Lamp and Fundus Exam     External Exam       Right Left   External Normal Normal         Slit Lamp Exam       Right Left   Lids/Lashes Dermatochalasis - upper lid, Meibomian gland dysfunction Dermatochalasis - upper lid, Meibomian gland dysfunction   Conjunctiva/Sclera Pinguecula, Melanosis White and quiet   Cornea Arcus Arcus   Anterior Chamber Deep and clear Deep and clear   Iris Round and moderately dilated 4.3m, No NVI Round and moderately dilated 4.720m  Lens 2+ Nuclear sclerosis, 2+ Cortical cataract 2+ Nuclear sclerosis, 2+ Posterior capsular opacification   Anterior Vitreous Vitreous syneresis Vitreous syneresis         Fundus Exam       Right Left   Disc Pink and sharp, +fibrosis Pink and sharp   C/D Ratio 0.6 0.6   Macula Blunted foveal reflex, lamellar hole, tractional fibrosis along arcades temporal mac, scattered DBH Flat, Blunted foveal reflex, ERM with striae, +fibrosis, DBH   Vessels Attenuated, Tortuous, +fibrotic NVE Attenuated, Tortuous, AV crossing changes, +NVE along arcades   Periphery Attached, scattered DBH, scattered fibrosis, 360 PRP with room  for fill in Attached, scattered MA/ DBH, very light PRP 360  Refraction     Wearing Rx       Sphere Cylinder Axis Add   Right +1.50 +0.50 013 +2.50   Left +2.00 Sphere  +2.50            IMAGING AND PROCEDURES  Imaging and Procedures for 04/24/2022  OCT, Retina - OU - Both Eyes       Right Eye Quality was borderline. Central Foveal Thickness: 948. Progression has been stable. Findings include no SRF, abnormal foveal contour, intraretinal hyper-reflective material, epiretinal membrane, intraretinal fluid, preretinal fibrosis (Extensive preretinal fibrosis with tractional edema).   Left Eye Quality was good. Central Foveal Thickness: 276. Progression has been stable. Findings include abnormal foveal contour, intraretinal hyper-reflective material, epiretinal membrane, intraretinal fluid, macular pucker, preretinal fibrosis (Persistent cystic changes ).   Notes *Images captured and stored on drive  Diagnosis / Impression:  DME OU OD: Extensive preretinal fibrosis with tractional edema OS: Persistent cystic changes  Clinical management:  See below  Abbreviations: NFP - Normal foveal profile. CME - cystoid macular edema. PED - pigment epithelial detachment. IRF - intraretinal fluid. SRF - subretinal fluid. EZ - ellipsoid zone. ERM - epiretinal membrane. ORA - outer retinal atrophy. ORT - outer retinal tubulation. SRHM - subretinal hyper-reflective material. IRHM - intraretinal hyper-reflective material      Panretinal Photocoagulation - OD - Right Eye       LASER PROCEDURE NOTE  Diagnosis:   Proliferative Diabetic Retinopathy, RIGHT EYE  Procedure:  Pan-retinal photocoagulation using slit lamp laser, RIGHT EYE, fill-in  Anesthesia:  Topical  Surgeon: Bernarda Caffey, MD, PhD   Informed consent obtained, operative eye marked, and time out performed prior to initiation of laser.   [facility] [id number] slit lamp laser Pattern: 3x3 square Power: 320  mW Duration: 30 msec  Spot size: 200 microns  # spots: [id number] spots  Complications: None.  RTC: 2-3 wks for PRP, contralateral eye  Patient tolerated the procedure well and received written and verbal post-procedure care information/education.             ASSESSMENT/PLAN:    ICD-10-CM   1. Proliferative diabetic retinopathy of both eyes with macular edema associated with type 2 diabetes mellitus (HCC)  E11.3513 OCT, Retina - OU - Both Eyes    Panretinal Photocoagulation - OD - Right Eye    2. Essential hypertension  I10     3. Hypertensive retinopathy of both eyes  H35.033     4. Combined forms of age-related cataract of both eyes  H25.813      Proliferative diabetic retinopathy OU - A1C 9.7 on 05.25.23;  11.7 on 10.27.22 - hx PRP OU in 2020 by Dr. Raylene Miyamoto -- was lost to retina f/u from 2020 to 2023 - exam shows scattered MAs/DBH OU; scattered fibrosis and NVE OU - FA 5.17.23 shows [place] NVE greatest along temporal arcades OD, along temporal macula OS - OCT shows diabetic macular edema OU, OD: Extensive preretinal fibrosis with tractional edema; OS: Persistent cystic changes  - recommend PRP fill in OU -- OD first today - pt wishes to proceed with laser - RBA of procedure discussed, questions answered - informed consent obtained and signed - see procedure note - start Lotemax QID OD x5-7 days - f/u in 3-4 wks -- DFE/OCT/PRP OS  2,3 Hypertensive retinopathy OU - discussed importance of tight BP control  - will continue to monitor  4. Mixed Cataract OU - The symptoms of cataract, surgical options, and treatments and risks were discussed with  patient. - discussed diagnosis and progression - monitor   Ophthalmic Meds Ordered this visit:  No orders of the defined types were placed in this encounter.    Return for Return in 3-4 weeks DFE/OCT/PRP OS.  There are no Patient Instructions on file for this visit.   Explained the diagnoses, plan, and follow up with  the patient and they expressed understanding.  Patient expressed understanding of the importance of proper follow up care.   This document serves as a record of services personally performed by Gardiner Sleeper, MD, PhD. It was created on their behalf by Orvan Falconer, an ophthalmic technician. The creation of this record is the provider's dictation and/or activities during the visit.    Electronically signed by: Orvan Falconer, OA, 04/24/22  12:57 PM   Gardiner Sleeper, M.D., Ph.D. Diseases & Surgery of the Retina and Vitreous [facility]  I have reviewed the above documentation for accuracy and completeness, and I agree with the above. Gardiner Sleeper, M.D., Ph.D. 04/24/22 12:58 PM   Abbreviations: M myopia (nearsighted); A astigmatism; H hyperopia (farsighted); P presbyopia; Mrx spectacle prescription;  CTL contact lenses; OD right eye; OS left eye; OU both eyes  XT exotropia; ET esotropia; PEK punctate epithelial keratitis; PEE punctate epithelial erosions; DES dry eye syndrome; MGD meibomian gland dysfunction; ATs artificial tears; PFAT's preservative free artificial tears; [place] nuclear sclerotic cataract; PSC posterior subcapsular cataract; ERM epi-retinal membrane; PVD posterior vitreous detachment; RD retinal detachment; DM diabetes mellitus; DR diabetic retinopathy; NPDR non-proliferative diabetic retinopathy; PDR proliferative diabetic retinopathy; CSME clinically significant macular edema; DME diabetic macular edema; dbh dot blot hemorrhages; CWS cotton wool spot; POAG primary open angle glaucoma; C/D cup-to-disc ratio; HVF humphrey visual field; GVF goldmann visual field; OCT optical coherence tomography; IOP intraocular pressure; BRVO Branch retinal vein occlusion; CRVO central retinal vein occlusion; CRAO central retinal artery occlusion; BRAO branch retinal artery occlusion; RT retinal tear; SB scleral buckle; PPV pars plana vitrectomy; VH Vitreous hemorrhage;  PRP panretinal laser photocoagulation; IVK intravitreal kenalog; VMT vitreomacular traction; MH Macular hole;  NVD neovascularization of the disc; NVE neovascularization elsewhere; AREDS age related eye disease study; ARMD age related macular degeneration; POAG primary open angle glaucoma; EBMD epithelial/anterior basement membrane dystrophy; ACIOL anterior chamber intraocular lens; IOL intraocular lens; PCIOL posterior chamber intraocular lens; Phaco/IOL phacoemulsification with intraocular lens placement; [place] photorefractive keratectomy; LASIK laser assisted in situ keratomileusis; HTN hypertension; DM diabetes mellitus; COPD chronic obstructive pulmonary disease

## 2022-04-24 ENCOUNTER — Ambulatory Visit (INDEPENDENT_AMBULATORY_CARE_PROVIDER_SITE_OTHER): Payer: Medicare Other | Admitting: Ophthalmology

## 2022-04-24 ENCOUNTER — Encounter (INDEPENDENT_AMBULATORY_CARE_PROVIDER_SITE_OTHER): Payer: Self-pay | Admitting: Ophthalmology

## 2022-04-24 DIAGNOSIS — E113513 Type 2 diabetes mellitus with proliferative diabetic retinopathy with macular edema, bilateral: Secondary | ICD-10-CM | POA: Diagnosis not present

## 2022-04-24 DIAGNOSIS — I10 Essential (primary) hypertension: Secondary | ICD-10-CM

## 2022-04-24 DIAGNOSIS — H35033 Hypertensive retinopathy, bilateral: Secondary | ICD-10-CM | POA: Diagnosis not present

## 2022-04-24 DIAGNOSIS — H25813 Combined forms of age-related cataract, bilateral: Secondary | ICD-10-CM | POA: Diagnosis not present

## 2022-05-22 ENCOUNTER — Encounter (INDEPENDENT_AMBULATORY_CARE_PROVIDER_SITE_OTHER): Payer: Medicare Other | Admitting: Ophthalmology

## 2022-05-22 DIAGNOSIS — H25813 Combined forms of age-related cataract, bilateral: Secondary | ICD-10-CM

## 2022-05-22 DIAGNOSIS — I10 Essential (primary) hypertension: Secondary | ICD-10-CM

## 2022-05-22 DIAGNOSIS — H35033 Hypertensive retinopathy, bilateral: Secondary | ICD-10-CM

## 2022-05-22 DIAGNOSIS — E113513 Type 2 diabetes mellitus with proliferative diabetic retinopathy with macular edema, bilateral: Secondary | ICD-10-CM

## 2022-05-26 ENCOUNTER — Emergency Department (HOSPITAL_COMMUNITY)
Admission: EM | Admit: 2022-05-26 | Discharge: 2022-05-26 | Disposition: A | Discharge status: Home / Self Care | Payer: Medicare Other | Attending: Emergency Medicine | Admitting: Emergency Medicine

## 2022-05-26 ENCOUNTER — Other Ambulatory Visit: Payer: Self-pay

## 2022-05-26 ENCOUNTER — Encounter (HOSPITAL_COMMUNITY): Payer: Self-pay

## 2022-05-26 DIAGNOSIS — R1084 Generalized abdominal pain: Secondary | ICD-10-CM

## 2022-05-26 DIAGNOSIS — R112 Nausea with vomiting, unspecified: Secondary | ICD-10-CM | POA: Diagnosis not present

## 2022-05-26 DIAGNOSIS — Z79899 Other long term (current) drug therapy: Secondary | ICD-10-CM | POA: Diagnosis not present

## 2022-05-26 DIAGNOSIS — Z794 Long term (current) use of insulin: Secondary | ICD-10-CM | POA: Insufficient documentation

## 2022-05-26 DIAGNOSIS — R519 Headache, unspecified: Secondary | ICD-10-CM | POA: Diagnosis not present

## 2022-05-26 DIAGNOSIS — R1111 Vomiting without nausea: Secondary | ICD-10-CM

## 2022-05-26 DIAGNOSIS — M791 Myalgia, unspecified site: Secondary | ICD-10-CM | POA: Diagnosis not present

## 2022-05-26 LAB — COMPREHENSIVE METABOLIC PANEL
ALT: 16 U/L (ref 0–44)
AST: 16 U/L (ref 15–41)
Albumin: 4.2 g/dL (ref 3.5–5.0)
Alkaline Phosphatase: 78 U/L (ref 38–126)
Anion gap: 8 (ref 5–15)
BUN: 18 mg/dL (ref 6–20)
CO2: 27 mmol/L (ref 22–32)
Calcium: 9.6 mg/dL (ref 8.9–10.3)
Chloride: 101 mmol/L (ref 98–111)
Creatinine, Ser: 1.19 mg/dL (ref 0.61–1.24)
GFR, Estimated: >60 mL/min (ref >60)
Glucose, Bld: 295 mg/dL — ABNORMAL HIGH (ref 70–99)
Potassium: 3.9 mmol/L (ref 3.5–5.1)
Sodium: 136 mmol/L (ref 135–145)
Total Bilirubin: 1.2 mg/dL (ref 0.3–1.2)
Total Protein: 7.7 g/dL (ref 6.5–8.1)

## 2022-05-26 LAB — CBC
HCT: 40.3 % (ref 39.0–52.0)
Hemoglobin: 13.8 g/dL (ref 13.0–17.0)
MCH: 28.2 pg (ref 26.0–34.0)
MCHC: 34.2 g/dL (ref 30.0–36.0)
MCV: 82.2 fL (ref 80.0–100.0)
Platelets: 323 10*3/uL (ref 150–400)
RBC: 4.9 MIL/uL (ref 4.22–5.81)
RDW: 13 % (ref 11.5–15.5)
WBC: 10.1 10*3/uL (ref 4.0–10.5)
nRBC: 0 % (ref 0.0–0.2)

## 2022-05-26 LAB — LIPASE, BLOOD: Lipase: 22 U/L (ref 11–51)

## 2022-05-26 MED ORDER — SODIUM CHLORIDE 0.9 % IV BOLUS
1000.0000 mL | Freq: Once | INTRAVENOUS | Status: AC
  Administered 2022-05-26: 1000 mL via INTRAVENOUS

## 2022-05-26 MED ORDER — LORAZEPAM 2 MG/ML IJ SOLN
2.0000 mg | Freq: Once | INTRAMUSCULAR | Status: AC
  Administered 2022-05-26: 2 mg via INTRAVENOUS
  Filled 2022-05-26: qty 1

## 2022-05-26 MED ORDER — ONDANSETRON HCL 4 MG/2ML IJ SOLN
4.0000 mg | Freq: Once | INTRAMUSCULAR | Status: AC
  Administered 2022-05-26: 4 mg via INTRAVENOUS
  Filled 2022-05-26: qty 2

## 2022-05-26 MED ORDER — ONDANSETRON 4 MG PO TBDP: 4.0000 mg | ORAL_TABLET | Freq: Three times a day (TID) | ORAL | 0 refills | Status: DC | PRN

## 2022-05-26 NOTE — Discharge Instructions (Signed)
Call your primary care doctor or specialist as discussed in the next 2-3 days.   Return immediately back to the ER if:  Your symptoms worsen within the next 12-24 hours. You develop new symptoms such as new fevers, persistent vomiting, new pain, shortness of breath, or new weakness or numbness, or if you have any other concerns.  

## 2022-05-26 NOTE — ED Triage Notes (Signed)
PT arrives via EMS. Pt c/o abdominal pain, nausea, and vomiting x 3 days. Pt is is cool to the touch and pal. Pt reports using crack cocaine yesterday. Denies cp or sob

## 2022-05-26 NOTE — ED Provider Notes (Signed)
[facility]-EMERGENCY DEPT Provider Note   CSN: [id number] Arrival date & time: 05/26/22  1308     History  Chief Complaint  Patient presents with   Abdominal Pain   Headache   Emesis    James Chang is a 56 y.o. male.  Chief complaint of nausea vomiting abdominal pain and body aches.  He states he has been having the symptoms for the past 3 days.  He did crack cocaine in the last 2 days ago.  Otherwise denies any chest pain or difficulty breathing no reports of fevers no reports of diarrhea.  He said multiple similar symptoms in the past and states it feels very similar.       Home Medications Prior to Admission medications   Medication Sig Start Date End Date Taking? Authorizing Provider  ABILIFY MAINTENA 400 MG PRSY prefilled syringe Inject 1 each into the muscle every 30 (thirty) days. 03/18/22   [provider]  atorvastatin (LIPITOR) 40 MG tablet Take 40 mg by mouth daily.    [provider]  busPIRone (BUSPAR) 15 MG tablet Take 15 mg by mouth 2 (two) times daily. 03/18/22   [provider]  diclofenac Sodium (VOLTAREN) 1 % GEL Apply 1 application. topically daily as needed (For pain). 03/18/22   [provider]  DULoxetine (CYMBALTA) 20 MG capsule Take 20 mg by mouth daily. 01/16/22   [provider]  gabapentin (NEURONTIN) 300 MG capsule Take 300 mg by mouth daily as needed (For back pain). 03/18/22   [provider]  insulin glargine (LANTUS SOLOSTAR) 100 UNIT/ML Solostar Pen Inject 40 Units into the skin daily. 04/24/18   [provider]  JARDIANCE 25 MG TABS tablet Take 25 mg by mouth daily. 02/04/22   [provider]  LINZESS 145 MCG CAPS capsule Take 145 mcg by mouth daily. 03/18/22   [provider]  losartan (COZAAR) 100 MG tablet Take 100 mg by mouth daily. 05/13/22   [provider]  ondansetron (ZOFRAN ODT) 8 MG disintegrating tablet Take 1 tablet (8 mg total) by  mouth every 8 (eight) hours as needed for nausea or vomiting. 08/31/21   Margarita Grizzle, MD  ondansetron (ZOFRAN) 4 MG tablet Take 4 mg by mouth every 8 (eight) hours as needed for nausea/vomiting. 05/13/22   [provider]  pantoprazole (PROTONIX) 40 MG tablet Take 40 mg by mouth daily. 08/08/20   [provider]  prochlorperazine (COMPAZINE) 10 MG tablet Take 1 tablet (10 mg total) by mouth 2 (two) times daily as needed for nausea or vomiting. 12/24/21   Arthor Captain, PA-C  promethazine (PHENERGAN) 25 MG suppository Place 1 suppository (25 mg total) rectally every 6 (six) hours as needed for nausea or vomiting. 12/24/21   Melene Plan, DO  tiZANidine (ZANAFLEX) 4 MG tablet Take 4 mg by mouth 3 (three) times daily. 01/29/22   [provider]      Allergies    Metformin and related and Reglan [metoclopramide]    Review of Systems   Review of Systems  Constitutional:  Negative for fever.  HENT:  Negative for ear pain and sore throat.   Eyes:  Negative for pain.  Respiratory:  Negative for cough.   Cardiovascular:  Negative for chest pain.  Gastrointestinal:  Negative for abdominal pain.  Genitourinary:  Negative for flank pain.  Musculoskeletal:  Negative for back pain.  Skin:  Negative for color change and rash.  Neurological:  Negative for syncope.  All other systems reviewed and are negative.   Physical Exam Updated Vital Signs BP (!) 101/50   Pulse 76   Temp 98.1 F (36.7 C) (Oral)   Resp 14   Ht 5\' 6"  (1.676 m)   Wt 78.9 kg   SpO2 99%   BMI 28.08 kg/m  Physical Exam Constitutional:      Appearance: He is well-developed.  HENT:     Head: Normocephalic.     Nose: Nose normal.  Eyes:     Extraocular Movements: Extraocular movements intact.  Cardiovascular:     Rate and Rhythm: Normal rate.  Pulmonary:     Effort: Pulmonary effort is normal.  Abdominal:     Tenderness: There is no abdominal tenderness. There is no guarding or rebound.  Skin:     Coloration: Skin is not jaundiced.  Neurological:     Mental Status: He is alert. Mental status is at baseline.     ED Results / Procedures / Treatments   Labs (all labs ordered are listed, but only abnormal results are displayed) Labs Reviewed  COMPREHENSIVE METABOLIC PANEL - Abnormal; Notable for the following components:      Result Value   Glucose, Bld 295 (*)    All other components within normal limits  LIPASE, BLOOD  CBC  URINALYSIS, ROUTINE W REFLEX MICROSCOPIC    EKG EKG Interpretation  Date/Time:  Sunday May 26 2022 13:21:12 EDT Ventricular Rate:  76 PR Interval:  139 QRS Duration: 85 QT Interval:  394 QTC Calculation: 443 R Axis:   76 Text Interpretation: Sinus rhythm Consider left ventricular hypertrophy Anterior ST elevation, probably due to LVH Confirmed by 11-10-1989 ([id number]) on 05/26/2022 1:34:21 PM  Radiology No results found.  Procedures Procedures    Medications Ordered in ED Medications  sodium chloride 0.9 % bolus 1,000 mL (1,000 mLs Intravenous New Bag/Given 05/26/22 1338)  ondansetron (ZOFRAN) injection 4 mg (4 mg Intravenous Given 05/26/22 1340)  LORazepam (ATIVAN) injection 2 mg (2 mg Intravenous Given 05/26/22 1356)    ED Course/ Medical Decision Making/ A&P                           Medical Decision Making Amount and/or Complexity of Data Reviewed Labs: ordered.  Risk Prescription drug management.   Review of record shows office visit April 24, 2022 for diabetic retinopathy.  Cardiac monitoring showing sinus rhythm.  Work-up today includes labs CBC chemistry.  White count normal hemoglobin 14 chemistry unremarkable lipase normal.  Patient given IV fluids Zofran and Ativan with significant improvement.  We will give a prescription of Zofran, advised outpatient follow-up within the next 2 to 3 days, advised immediate return for worsening symptoms or any additional concerns.        Final Clinical Impression(s) / ED  Diagnoses Final diagnoses:  Generalized abdominal pain  Vomiting without nausea, unspecified vomiting type    Rx / DC Orders ED Discharge Orders     None         April 26, 2022, MD 05/26/22 1457

## 2022-05-26 NOTE — ED Provider Notes (Signed)
[facility]-EMERGENCY DEPT Provider Note   CSN: [id number] Arrival date & time: 05/26/22  1308     History  Chief Complaint  Patient presents with   Abdominal Pain   Headache   Emesis    James Chang is a 56 y.o. male.  HPI     Home Medications Prior to Admission medications   Medication Sig Start Date End Date Taking? Authorizing Provider  ondansetron (ZOFRAN-ODT) 4 MG disintegrating tablet Take 1 tablet (4 mg total) by mouth every 8 (eight) hours as needed for nausea or vomiting. 05/26/22  Yes Feliberto Stockley, Eustace Moore, MD  ABILIFY MAINTENA 400 MG PRSY prefilled syringe Inject 1 each into the muscle every 30 (thirty) days. 03/18/22   [provider]  atorvastatin (LIPITOR) 40 MG tablet Take 40 mg by mouth daily.    [provider]  busPIRone (BUSPAR) 15 MG tablet Take 15 mg by mouth 2 (two) times daily. 03/18/22   [provider]  diclofenac Sodium (VOLTAREN) 1 % GEL Apply 1 application. topically daily as needed (For pain). 03/18/22   [provider]  DULoxetine (CYMBALTA) 20 MG capsule Take 20 mg by mouth daily. 01/16/22   [provider]  gabapentin (NEURONTIN) 300 MG capsule Take 300 mg by mouth daily as needed (For back pain). 03/18/22   [provider]  insulin glargine (LANTUS SOLOSTAR) 100 UNIT/ML Solostar Pen Inject 40 Units into the skin daily. 04/24/18   [provider]  JARDIANCE 25 MG TABS tablet Take 25 mg by mouth daily. 02/04/22   [provider]  LINZESS 145 MCG CAPS capsule Take 145 mcg by mouth daily. 03/18/22   [provider]  losartan (COZAAR) 100 MG tablet Take 100 mg by mouth daily. 05/13/22   [provider]  ondansetron (ZOFRAN) 4 MG tablet Take 4 mg by mouth every 8 (eight) hours as needed for nausea/vomiting. 05/13/22   [provider]  pantoprazole (PROTONIX) 40 MG tablet Take 40 mg by mouth daily. 08/08/20   [provider]  prochlorperazine  (COMPAZINE) 10 MG tablet Take 1 tablet (10 mg total) by mouth 2 (two) times daily as needed for nausea or vomiting. 12/24/21   Arthor Captain, PA-C  promethazine (PHENERGAN) 25 MG suppository Place 1 suppository (25 mg total) rectally every 6 (six) hours as needed for nausea or vomiting. 12/24/21   Melene Plan, DO  tiZANidine (ZANAFLEX) 4 MG tablet Take 4 mg by mouth 3 (three) times daily. 01/29/22   [provider]      Allergies    Metformin and related and Reglan [metoclopramide]    Review of Systems   Review of Systems  Constitutional:  Negative for fever.  HENT:  Negative for ear pain and sore throat.   Eyes:  Negative for pain.  Respiratory:  Negative for cough.   Cardiovascular:  Negative for chest pain.  Gastrointestinal:  Positive for abdominal pain and vomiting.  Genitourinary:  Negative for flank pain.  Musculoskeletal:  Negative for back pain.  Skin:  Negative for color change and rash.  Neurological:  Negative for syncope.  All other systems reviewed and are negative.   Physical Exam Updated Vital Signs BP (!) 88/53   Pulse 78   Temp 98.1 F (36.7 C) (Oral)   Resp 13   Ht 5\' 6"  (1.676 m)   Wt 78.9 kg   SpO2 99%   BMI 28.08 kg/m  Physical Exam Constitutional:      Appearance: He is  well-developed.  HENT:     Head: Normocephalic.     Nose: Nose normal.  Eyes:     Extraocular Movements: Extraocular movements intact.  Cardiovascular:     Rate and Rhythm: Normal rate.  Pulmonary:     Effort: Pulmonary effort is normal.  Abdominal:     Tenderness: There is no abdominal tenderness. There is no guarding or rebound.  Skin:    Coloration: Skin is not jaundiced.  Neurological:     Mental Status: He is alert. Mental status is at baseline.     ED Results / Procedures / Treatments   Labs (all labs ordered are listed, but only abnormal results are displayed) Labs Reviewed  COMPREHENSIVE METABOLIC PANEL - Abnormal; Notable for the following components:       Result Value   Glucose, Bld 295 (*)    All other components within normal limits  LIPASE, BLOOD  CBC  URINALYSIS, ROUTINE W REFLEX MICROSCOPIC    EKG EKG Interpretation  Date/Time:  Sunday May 26 2022 13:21:12 EDT Ventricular Rate:  76 PR Interval:  139 QRS Duration: 85 QT Interval:  394 QTC Calculation: 443 R Axis:   76 Text Interpretation: Sinus rhythm Consider left ventricular hypertrophy Anterior ST elevation, probably due to LVH Confirmed by Phuong Moffatt ([id number]) on 05/26/2022 1:34:21 PM  Radiology No results found.  Procedures Procedures    Medications Ordered in ED Medications  sodium chloride 0.9 % bolus 1,000 mL (1,000 mLs Intravenous New Bag/Given 05/26/22 1338)  ondansetron (ZOFRAN) injection 4 mg (4 mg Intravenous Given 05/26/22 1340)  LORazepam (ATIVAN) injection 2 mg (2 mg Intravenous Given 05/26/22 1356)    ED Course/ Medical Decision Making/ A&P                           Medical Decision Making Amount and/or Complexity of Data Reviewed Labs: ordered.  Risk Prescription drug management.   Cardiac monitoring showing sinus rhythm.  Review of records shows ER visit in May for nausea and vomiting.  Work-up includes labs CBC chemistry.  White count 10 hemoglobin 14 chemistry remarkable lipase normal.  Patient given liter bolus of fluids Ativan and Zofran with improvement of symptoms no longer vomiting.  We will recommend outpatient follow-up with his doctor this week, recommended return if she is unable to keep down any fluids or if she has any additional concerns.  Otherwise given a prescription of Zofran to go home with.        Final Clinical Impression(s) / ED Diagnoses Final diagnoses:  Generalized abdominal pain  Vomiting without nausea, unspecified vomiting type    Rx / DC Orders ED Discharge Orders          Ordered    ondansetron (ZOFRAN-ODT) 4 MG disintegrating tablet  Every 8 hours PRN        07 /16/23 1500               11-08-2006, MD 05/26/22 1500

## 2022-05-28 ENCOUNTER — Emergency Department (HOSPITAL_COMMUNITY)
Admission: EM | Admit: 2022-05-28 | Discharge: 2022-05-28 | Disposition: A | Discharge status: Home / Self Care | Payer: Medicare Other | Attending: Student | Admitting: Student

## 2022-05-28 ENCOUNTER — Encounter (INDEPENDENT_AMBULATORY_CARE_PROVIDER_SITE_OTHER): Payer: Medicare Other | Admitting: Ophthalmology

## 2022-05-28 ENCOUNTER — Other Ambulatory Visit: Payer: Self-pay

## 2022-05-28 ENCOUNTER — Emergency Department (HOSPITAL_COMMUNITY): Payer: Medicare Other

## 2022-05-28 ENCOUNTER — Encounter (HOSPITAL_COMMUNITY): Payer: Self-pay | Admitting: Emergency Medicine

## 2022-05-28 DIAGNOSIS — H25813 Combined forms of age-related cataract, bilateral: Secondary | ICD-10-CM

## 2022-05-28 DIAGNOSIS — K3184 Gastroparesis: Secondary | ICD-10-CM | POA: Diagnosis not present

## 2022-05-28 DIAGNOSIS — I129 Hypertensive chronic kidney disease with stage 1 through stage 4 chronic kidney disease, or unspecified chronic kidney disease: Secondary | ICD-10-CM | POA: Insufficient documentation

## 2022-05-28 DIAGNOSIS — K5900 Constipation, unspecified: Secondary | ICD-10-CM | POA: Insufficient documentation

## 2022-05-28 DIAGNOSIS — E1122 Type 2 diabetes mellitus with diabetic chronic kidney disease: Secondary | ICD-10-CM | POA: Diagnosis not present

## 2022-05-28 DIAGNOSIS — R1013 Epigastric pain: Secondary | ICD-10-CM

## 2022-05-28 DIAGNOSIS — N1831 Chronic kidney disease, stage 3a: Secondary | ICD-10-CM | POA: Insufficient documentation

## 2022-05-28 DIAGNOSIS — Z794 Long term (current) use of insulin: Secondary | ICD-10-CM | POA: Diagnosis not present

## 2022-05-28 DIAGNOSIS — Z79899 Other long term (current) drug therapy: Secondary | ICD-10-CM | POA: Diagnosis not present

## 2022-05-28 DIAGNOSIS — I10 Essential (primary) hypertension: Secondary | ICD-10-CM

## 2022-05-28 DIAGNOSIS — H35033 Hypertensive retinopathy, bilateral: Secondary | ICD-10-CM

## 2022-05-28 DIAGNOSIS — E113513 Type 2 diabetes mellitus with proliferative diabetic retinopathy with macular edema, bilateral: Secondary | ICD-10-CM

## 2022-05-28 LAB — COMPREHENSIVE METABOLIC PANEL
ALT: 14 U/L (ref 0–44)
AST: 16 U/L (ref 15–41)
Albumin: 3.7 g/dL (ref 3.5–5.0)
Alkaline Phosphatase: 74 U/L (ref 38–126)
Anion gap: 12 (ref 5–15)
BUN: 26 mg/dL — ABNORMAL HIGH (ref 6–20)
CO2: 26 mmol/L (ref 22–32)
Calcium: 9.5 mg/dL (ref 8.9–10.3)
Chloride: 100 mmol/L (ref 98–111)
Creatinine, Ser: 1.54 mg/dL — ABNORMAL HIGH (ref 0.61–1.24)
GFR, Estimated: 52 mL/min — ABNORMAL LOW (ref >60)
Glucose, Bld: 126 mg/dL — ABNORMAL HIGH (ref 70–99)
Potassium: 3.6 mmol/L (ref 3.5–5.1)
Sodium: 138 mmol/L (ref 135–145)
Total Bilirubin: 1 mg/dL (ref 0.3–1.2)
Total Protein: 6.8 g/dL (ref 6.5–8.1)

## 2022-05-28 LAB — URINALYSIS, ROUTINE W REFLEX MICROSCOPIC
Bacteria, UA: NONE SEEN
Bilirubin Urine: NEGATIVE
Glucose, UA: 50 mg/dL — AB
Ketones, ur: NEGATIVE mg/dL
Leukocytes,Ua: NEGATIVE
Nitrite: NEGATIVE
Protein, ur: 100 mg/dL — AB
Specific Gravity, Urine: 1.014 (ref 1.005–1.030)
pH: 7 (ref 5.0–8.0)

## 2022-05-28 LAB — CBC
HCT: 40 % (ref 39.0–52.0)
Hemoglobin: 13.4 g/dL (ref 13.0–17.0)
MCH: 27.9 pg (ref 26.0–34.0)
MCHC: 33.5 g/dL (ref 30.0–36.0)
MCV: 83.2 fL (ref 80.0–100.0)
Platelets: 332 10*3/uL (ref 150–400)
RBC: 4.81 MIL/uL (ref 4.22–5.81)
RDW: 12.9 % (ref 11.5–15.5)
WBC: 9.8 10*3/uL (ref 4.0–10.5)
nRBC: 0 % (ref 0.0–0.2)

## 2022-05-28 LAB — TROPONIN I (HIGH SENSITIVITY)
Troponin I (High Sensitivity): 13 ng/L (ref <18)
Troponin I (High Sensitivity): 12 ng/L (ref <18)

## 2022-05-28 LAB — LIPASE, BLOOD: Lipase: 22 U/L (ref 11–51)

## 2022-05-28 MED ORDER — DICYCLOMINE HCL 20 MG PO TABS: 20.0000 mg | ORAL_TABLET | Freq: Two times a day (BID) | ORAL | 0 refills | Status: AC

## 2022-05-28 MED ORDER — ALUM & MAG HYDROXIDE-SIMETH 200-200-20 MG/5ML PO SUSP
30.0000 mL | Freq: Once | ORAL | Status: AC
  Administered 2022-05-28: 30 mL via ORAL
  Filled 2022-05-28: qty 30

## 2022-05-28 MED ORDER — LACTULOSE 10 GM/15ML PO SOLN
30.0000 g | Freq: Once | ORAL | Status: AC
  Administered 2022-05-28: 30 g via ORAL
  Filled 2022-05-28: qty 60

## 2022-05-28 MED ORDER — ALUM & MAG HYDROXIDE-SIMETH 400-400-40 MG/5ML PO SUSP: 15.0000 mL | Freq: Four times a day (QID) | ORAL | 0 refills | Status: AC | PRN

## 2022-05-28 MED ORDER — ONDANSETRON 4 MG PO TBDP
4.0000 mg | ORAL_TABLET | Freq: Once | ORAL | Status: AC | PRN
  Administered 2022-05-28: 4 mg via ORAL
  Filled 2022-05-28: qty 1

## 2022-05-28 MED ORDER — LIDOCAINE VISCOUS HCL 2 % MT SOLN
15.0000 mL | Freq: Once | OROMUCOSAL | Status: AC
  Administered 2022-05-28: 15 mL via OROMUCOSAL
  Filled 2022-05-28: qty 15

## 2022-05-28 MED ORDER — LORAZEPAM 2 MG/ML IJ SOLN
0.5000 mg | Freq: Once | INTRAMUSCULAR | Status: AC
  Administered 2022-05-28: 0.5 mg via INTRAVENOUS
  Filled 2022-05-28: qty 1

## 2022-05-28 MED ORDER — LOSARTAN POTASSIUM 50 MG PO TABS
100.0000 mg | ORAL_TABLET | Freq: Every day | ORAL | Status: DC
  Administered 2022-05-28: 100 mg via ORAL
  Filled 2022-05-28: qty 2

## 2022-05-28 MED ORDER — LACTATED RINGERS IV BOLUS
1000.0000 mL | Freq: Once | INTRAVENOUS | Status: AC
  Administered 2022-05-28: 1000 mL via INTRAVENOUS

## 2022-05-28 MED ORDER — DROPERIDOL 2.5 MG/ML IJ SOLN
1.2500 mg | Freq: Once | INTRAMUSCULAR | Status: AC
  Administered 2022-05-28: 1.25 mg via INTRAVENOUS
  Filled 2022-05-28: qty 2

## 2022-05-28 MED ORDER — IOHEXOL 300 MG/ML  SOLN
100.0000 mL | Freq: Once | INTRAMUSCULAR | Status: AC | PRN
  Administered 2022-05-28: 100 mL via INTRAVENOUS

## 2022-05-28 MED ORDER — FAMOTIDINE 20 MG PO TABS: 20.0000 mg | ORAL_TABLET | Freq: Two times a day (BID) | ORAL | 0 refills | Status: AC

## 2022-05-28 MED ORDER — FAMOTIDINE IN NACL 20-0.9 MG/50ML-% IV SOLN
20.0000 mg | Freq: Once | INTRAVENOUS | Status: AC
  Administered 2022-05-28: 20 mg via INTRAVENOUS
  Filled 2022-05-28: qty 50

## 2022-05-28 NOTE — ED Triage Notes (Addendum)
Pt to triage via [facility] from home.  Seen at [facility] 7/16.  C/o generalized abd pain, nausea, and vomiting.  Denies diarrhea.  States he already had a Zofran Rx and took it yesterday without relief.  Also reports generalized chest pain, posterior neck pain and headache.  No neuro deficits noted.  EMS- CBG 108

## 2022-05-28 NOTE — ED Provider Notes (Signed)
[name] EMERGENCY DEPARTMENT Provider Note  CSN: [id number] Arrival date & time: 05/28/22 0715  Chief Complaint(s) No chief complaint on file.  HPI BUD KAESER is a 56 y.o. male with PMH previous CVA, CKD 3, T2DM with diabetic gastroparesis, daily crack cocaine use who presents emergency department for evaluation of abdominal pain nausea and vomiting.  Patient was seen 2 days ago in the emergency department and states that the home Zofran is not helping his symptoms and he has been unable to tolerate p.o.  He states that he has not used crack cocaine in 2 days because he is feeling so sick.  He also endorses a burning chest pain that started after vomiting.  Denies shortness of breath, fever, diarrhea, hematochezia, hematemesis or other systemic symptoms.   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/19/2020   Substance induced mood disorder (HCC) 07/19/2020   Nausea and vomiting 12/26/2019   Vitamin D deficiency 12/23/2019   Diabetic gastroparesis (HCC) 10/28/2019   Constipation 10/28/2019   Proliferative diabetic retinopathy of left eye with macular edema associated with type 2 diabetes mellitus (HCC) 05/19/2019   Bipolar affective disorder in remission (HCC) 03/30/2019   Diabetic retinopathy of right eye with macular edema associated with type 2 diabetes mellitus (HCC) 03/30/2019   Fatigue 03/30/2019   Snoring 03/30/2019   Uncontrolled  type 2 diabetes mellitus with hyperglycemia (HCC) 03/30/2019   Cocaine use disorder, severe, dependence (HCC) 01/14/2019   Bursitis of right foot 09/29/2018   Metatarsalgia of right foot 09/29/2018   Biceps tendonitis on right 12/11/2017   Tendinitis of right rotator cuff 11/26/2017   Noncompliance with diabetes treatment 12/26/2016   Bulging of lumbar intervertebral disc 12/06/2016   Hammer toe of left foot 03/12/2016   Current use of insulin (HCC) 01/01/2016   Osteoarthritis of spine with radiculopathy, lumbar region 11/15/2014   Chronic pain syndrome 08/18/2014   Diabetes, polyneuropathy (HCC) 08/18/2014   Esophageal reflux 12/16/2013   Schizoaffective disorder (HCC) 04/17/2013   Peripheral neuropathy 11/24/2012   Hyperlipidemia 09/30/2012   Chronic low back pain 09/30/2012   Low testosterone 09/30/2012   Depression 09/30/2012   History of stroke 09/30/2012   HTN (hypertension) 09/30/2012   Home Medication(s) Prior to Admission medications   Medication Sig Start Date End Date Taking? Authorizing Provider  ABILIFY MAINTENA 400 MG PRSY prefilled syringe Inject 1 each into the muscle every 30 (thirty) days. 03/18/22   [provider]  atorvastatin (LIPITOR) 40 MG tablet Take 40 mg by mouth daily.    [provider]  busPIRone (BUSPAR) 15 MG tablet Take 15 mg by mouth 2 (two) times daily. 03/18/22   [provider]  diclofenac Sodium (VOLTAREN) 1 % GEL Apply 1 application. topically daily as needed (For pain). 03/18/22   [provider]  DULoxetine (CYMBALTA) 20 MG capsule Take 20 mg by mouth daily. 01/16/22   [provider]  gabapentin (NEURONTIN) 300 MG capsule Take 300 mg by mouth daily as needed (For back pain). 03/18/22   [provider]  insulin glargine (LANTUS SOLOSTAR) 100 UNIT/ML Solostar Pen Inject 40 Units into the skin daily. 04/24/18   [provider]  JARDIANCE 25 MG TABS tablet Take 25 mg by mouth daily. 02/04/22    [provider]  LINZESS 145 MCG CAPS capsule Take 145 mcg by mouth daily. 03/18/22   [provider]  losartan (COZAAR) 100 MG tablet Take 100 mg by mouth daily. 05/13/22   [provider]  ondansetron (ZOFRAN) 4 MG tablet Take 4 mg by mouth every 8 (eight) hours as needed for nausea/vomiting. 05/13/22   [provider]  ondansetron (ZOFRAN-ODT) 4 MG disintegrating tablet Take 1 tablet (4 mg total) by mouth every 8 (eight) hours as needed for nausea or vomiting. 05/26/22   Luna Fuse, MD  pantoprazole (PROTONIX) 40 MG tablet Take 40 mg by mouth daily. 08/08/20   [provider]  prochlorperazine (COMPAZINE) 10 MG tablet Take 1 tablet (10 mg total) by mouth 2 (two) times daily as needed for nausea or vomiting. 12/24/21   Margarita Mail, PA-C  promethazine (PHENERGAN) 25 MG suppository Place 1 suppository (25 mg total) rectally every 6 (six) hours as needed for nausea or vomiting. 12/24/21   Deno Etienne, DO  tiZANidine (ZANAFLEX) 4 MG tablet Take 4 mg by mouth 3 (three) times daily. 01/29/22   [provider]                                                                                                                                    Past Surgical History Past Surgical History:  Procedure Laterality Date   NO PAST SURGERIES     Family History Family History  Problem Relation Age of Onset   Arthritis Mother    Stroke Father    Hypertension Father    Kidney disease Father    Heart attack Father    Cancer Neg Hx    Diabetes Neg Hx     Social History Social History   Tobacco Use   Smoking status: Never   Smokeless tobacco: Never  Vaping Use   Vaping Use: Never used  Substance Use Topics   Alcohol use: Not Currently    Comment: seldom 1-2 times a month beer   Drug use: Yes    Types: Cocaine, "Crack" cocaine    Comment: "a couple days ago"   Allergies Metformin and related and Reglan [metoclopramide]  Review of Systems Review  of Systems  Cardiovascular:  Positive for chest pain.  Gastrointestinal:  Positive for abdominal pain, nausea and vomiting.    Physical Exam Vital Signs  I have reviewed the triage vital signs BP (!) 211/98   Pulse 76   Temp 98.4 F (36.9 C) (Oral)   Resp 15   SpO2 100%  Physical Exam Vitals and nursing note reviewed.  Constitutional:      General: He is not in acute distress.    Appearance: He is well-developed.  HENT:     Head: Normocephalic and atraumatic.  Eyes:     Conjunctiva/sclera: Conjunctivae normal.  Cardiovascular:     Rate and Rhythm: Normal rate and regular rhythm.     Heart sounds: No murmur heard. Pulmonary:     Effort: Pulmonary effort is normal. No respiratory distress.  Abdominal:     Tenderness: There is abdominal tenderness.  Musculoskeletal:        General: No swelling.     Cervical back: Neck supple.  Skin:    General: Skin is warm and dry.  Neurological:     Mental Status: He is alert.  Psychiatric:        Mood and Affect: Mood normal.     ED Results and Treatments Labs (all labs ordered are listed, but only abnormal results are displayed) Labs Reviewed  URINALYSIS, ROUTINE W REFLEX MICROSCOPIC - Abnormal; Notable for the following components:      Result Value   Color, Urine STRAW (*)    Glucose, UA 50 (*)    Hgb urine dipstick SMALL (*)    Protein, ur 100 (*)    All other components within normal limits  CBC  LIPASE, BLOOD  COMPREHENSIVE METABOLIC PANEL  TROPONIN I (HIGH SENSITIVITY)                                                                                                                          Radiology DG Chest 2 View  Result Date: 05/28/2022 CLINICAL DATA:  Chest pain EXAM: CHEST - 2 VIEW COMPARISON:  12/24/2021 chest radiograph. FINDINGS: Stable cardiomediastinal silhouette with normal heart size. No pneumothorax. No pleural effusion. Lungs appear clear, with no acute consolidative airspace disease and no pulmonary  edema. IMPRESSION: No active cardiopulmonary disease. Electronically Signed   By: Ilona Sorrel M.D.   On: 05/28/2022 08:03    Pertinent labs & imaging results that were available during my care of the patient were reviewed by me and considered in my medical decision making (see MDM for details).  Medications Ordered in ED Medications  droperidol (INAPSINE) 2.5 MG/ML injection 1.25 mg (has no administration in time range)  LORazepam (ATIVAN) injection 0.5 mg (has no administration in time range)  ondansetron (ZOFRAN-ODT) disintegrating tablet 4 mg (4 mg Oral Given 05/28/22 0724)  Procedures Procedures  (including critical care time)  Medical Decision Making / ED Course   This patient presents to the ED for concern of abdominal pain, nausea, vomiting, this involves an extensive number of treatment options, and is a complaint that carries with it a high risk of complications and morbidity.  The differential diagnosis includes gastroparesis, cyclic vomiting syndrome, intra-abdominal infection, constipation, gastritis, peptic ulcer disease  MDM: Patient seen in the emergency department for evaluation of abdominal pain, nausea and vomiting.  Physical exam with mild epigastric tenderness to palpation but is otherwise unremarkable.  Laboratory evaluation with a BUN of 26, creatinine 1.54 which is slightly elevated from 2 days ago but is near patient's baseline.  Lipase negative.  Minimal microscopic hematuria on UA but no evidence of infection.  CT abdomen pelvis with contrast showing a moderate to large stool burden but no acute intra-abdominal pathology.  Patient was given droperidol and Ativan and on reevaluation nausea and vomiting has improved.  He still had a minimal amount of epigastric pain and thus he was given a GI cocktail which alleviated his abdominal pain on  second reevaluation.  Patient's blood pressure was elevated while in the emergency department but admits to not taking his home blood pressure medications due to vomiting.  He was given lactulose for stool burden.  He is now able to tolerate p.o. and patient has no current signs of hypertensive emergency.  He was then discharged with outpatient GI follow-up, prescriptions for Pepcid and Maalox.   Additional history obtained:  -External records from outside source obtained and reviewed including: Chart review including previous notes, labs, imaging, consultation notes   Lab Tests: -I ordered, reviewed, and interpreted labs.   The pertinent results include:   Labs Reviewed  URINALYSIS, ROUTINE W REFLEX MICROSCOPIC - Abnormal; Notable for the following components:      Result Value   Color, Urine STRAW (*)    Glucose, UA 50 (*)    Hgb urine dipstick SMALL (*)    Protein, ur 100 (*)    All other components within normal limits  CBC  LIPASE, BLOOD  COMPREHENSIVE METABOLIC PANEL  TROPONIN I (HIGH SENSITIVITY)      EKG   EKG Interpretation  Date/Time:  Tuesday May 28 2022 07:41:27 EDT Ventricular Rate:  89 PR Interval:  142 QRS Duration: 86 QT Interval:  336 QTC Calculation: 408 R Axis:   45 Text Interpretation: Normal sinus rhythm with sinus arrhythmia inverted T waves in lateral leads seen in ECG from 03/2022 Abnormal ECG When compared with ECG of 26-May-2022 13:21, PREVIOUS ECG IS PRESENT Confirmed by Cire Deyarmin ([id number]) on 05/28/2022 8:06:23 AM         Imaging Studies ordered: I ordered imaging studies including CT abdomen pelvis with contrast I independently visualized and interpreted imaging. I agree with the radiologist interpretation   Medicines ordered and prescription drug management: Meds ordered this encounter  Medications   ondansetron (ZOFRAN-ODT) disintegrating tablet 4 mg   droperidol (INAPSINE) 2.5 MG/ML injection 1.25 mg   LORazepam (ATIVAN) injection  0.5 mg    -I have reviewed the patients home medicines and have made adjustments as needed  Critical interventions none    Cardiac Monitoring: The patient was maintained on a cardiac monitor.  I personally viewed and interpreted the cardiac monitored which showed an underlying rhythm of: NSR  Social Determinants of Health:  Factors impacting patients care include: Daily crack cocaine use   Reevaluation: After the interventions noted above, I  reevaluated the patient and found that they have :improved  Co morbidities that complicate the patient evaluation  Past Medical History:  Diagnosis Date   Depression    Diabetes mellitus without complication ([place])    History of migraine    Hyperlipidemia    Hypertension    Personal history of drug abuse ([place])    Stroke ([place]) 2008      Dispostion: I considered admission for this patient, but with symptoms improved, patient does not meet inpatient criteria for admission is safe for discharge with outpatient follow-up.     Final Clinical Impression(s) / ED Diagnoses Final diagnoses:  None     @PCDICTATION @    Teressa Lower, MD 05/28/22 1700

## 2022-06-25 ENCOUNTER — Encounter (INDEPENDENT_AMBULATORY_CARE_PROVIDER_SITE_OTHER): Payer: Medicare Other | Admitting: Ophthalmology

## 2022-08-31 ENCOUNTER — Emergency Department (HOSPITAL_COMMUNITY)
Admission: EM | Admit: 2022-08-31 | Discharge: 2022-08-31 | Discharge status: Against Medical Advice | Payer: Medicare Other | Attending: Emergency Medicine | Admitting: Emergency Medicine

## 2022-08-31 ENCOUNTER — Emergency Department (HOSPITAL_COMMUNITY): Payer: Medicare Other

## 2022-08-31 ENCOUNTER — Other Ambulatory Visit: Payer: Self-pay

## 2022-08-31 ENCOUNTER — Encounter (HOSPITAL_COMMUNITY): Payer: Self-pay | Admitting: Emergency Medicine

## 2022-08-31 DIAGNOSIS — E119 Type 2 diabetes mellitus without complications: Secondary | ICD-10-CM | POA: Insufficient documentation

## 2022-08-31 DIAGNOSIS — R1010 Upper abdominal pain, unspecified: Secondary | ICD-10-CM | POA: Insufficient documentation

## 2022-08-31 DIAGNOSIS — Z5321 Procedure and treatment not carried out due to patient leaving prior to being seen by health care provider: Secondary | ICD-10-CM | POA: Diagnosis not present

## 2022-08-31 DIAGNOSIS — R112 Nausea with vomiting, unspecified: Secondary | ICD-10-CM | POA: Insufficient documentation

## 2022-08-31 DIAGNOSIS — I10 Essential (primary) hypertension: Secondary | ICD-10-CM | POA: Diagnosis not present

## 2022-08-31 LAB — URINALYSIS, ROUTINE W REFLEX MICROSCOPIC
Bacteria, UA: NONE SEEN
Bilirubin Urine: NEGATIVE
Glucose, UA: >=500 mg/dL — AB
Hgb urine dipstick: NEGATIVE
Ketones, ur: 5 mg/dL — AB
Leukocytes,Ua: NEGATIVE
Nitrite: NEGATIVE
Protein, ur: >=300 mg/dL — AB
Specific Gravity, Urine: 1.022 (ref 1.005–1.030)
pH: 6 (ref 5.0–8.0)

## 2022-08-31 LAB — CBC
HCT: 38.5 % — ABNORMAL LOW (ref 39.0–52.0)
Hemoglobin: 13.3 g/dL (ref 13.0–17.0)
MCH: 28.3 pg (ref 26.0–34.0)
MCHC: 34.5 g/dL (ref 30.0–36.0)
MCV: 81.9 fL (ref 80.0–100.0)
Platelets: 334 10*3/uL (ref 150–400)
RBC: 4.7 MIL/uL (ref 4.22–5.81)
RDW: 12.5 % (ref 11.5–15.5)
WBC: 8.8 10*3/uL (ref 4.0–10.5)
nRBC: 0 % (ref 0.0–0.2)

## 2022-08-31 LAB — COMPREHENSIVE METABOLIC PANEL
ALT: 13 U/L (ref 0–44)
AST: 13 U/L — ABNORMAL LOW (ref 15–41)
Albumin: 3.8 g/dL (ref 3.5–5.0)
Alkaline Phosphatase: 71 U/L (ref 38–126)
Anion gap: 11 (ref 5–15)
BUN: 21 mg/dL — ABNORMAL HIGH (ref 6–20)
CO2: 24 mmol/L (ref 22–32)
Calcium: 9.3 mg/dL (ref 8.9–10.3)
Chloride: 95 mmol/L — ABNORMAL LOW (ref 98–111)
Creatinine, Ser: 1.65 mg/dL — ABNORMAL HIGH (ref 0.61–1.24)
GFR, Estimated: 48 mL/min — ABNORMAL LOW (ref >60)
Glucose, Bld: 311 mg/dL — ABNORMAL HIGH (ref 70–99)
Potassium: 4.1 mmol/L (ref 3.5–5.1)
Sodium: 130 mmol/L — ABNORMAL LOW (ref 135–145)
Total Bilirubin: 1.4 mg/dL — ABNORMAL HIGH (ref 0.3–1.2)
Total Protein: 6.9 g/dL (ref 6.5–8.1)

## 2022-08-31 LAB — LIPASE, BLOOD: Lipase: 23 U/L (ref 11–51)

## 2022-08-31 MED ORDER — ONDANSETRON 4 MG PO TBDP
8.0000 mg | ORAL_TABLET | Freq: Once | ORAL | Status: AC
  Administered 2022-08-31: 8 mg via ORAL
  Filled 2022-08-31: qty 2

## 2022-08-31 NOTE — ED Triage Notes (Signed)
Pt BIB [facility] from home, c/o upper abdominal pain, nausea/vomiting x 4 days, decreased PO intake x 3 days, also reports no BM x 1 week. Has been unable to take his medications. Hx HTN, diabetes. EMS VS: BP 209/119, HR 90, SpO2 99% room air, CBG 350

## 2022-08-31 NOTE — ED Notes (Signed)
Called no answer x2 

## 2022-08-31 NOTE — ED Notes (Signed)
Called for pt x2 for vitals, no response. 

## 2022-08-31 NOTE — ED Notes (Signed)
Called for vitals no answer x1

## 2022-10-04 ENCOUNTER — Emergency Department (HOSPITAL_COMMUNITY)
Admission: EM | Admit: 2022-10-04 | Discharge: 2022-10-04 | Disposition: A | Discharge status: Home / Self Care | Payer: Medicare Other | Attending: Emergency Medicine | Admitting: Emergency Medicine

## 2022-10-04 ENCOUNTER — Other Ambulatory Visit: Payer: Self-pay

## 2022-10-04 ENCOUNTER — Encounter (HOSPITAL_COMMUNITY): Payer: Self-pay | Admitting: Oncology

## 2022-10-04 DIAGNOSIS — I10 Essential (primary) hypertension: Secondary | ICD-10-CM | POA: Insufficient documentation

## 2022-10-04 DIAGNOSIS — K3184 Gastroparesis: Secondary | ICD-10-CM | POA: Insufficient documentation

## 2022-10-04 DIAGNOSIS — E1143 Type 2 diabetes mellitus with diabetic autonomic (poly)neuropathy: Secondary | ICD-10-CM | POA: Diagnosis not present

## 2022-10-04 DIAGNOSIS — Z794 Long term (current) use of insulin: Secondary | ICD-10-CM | POA: Insufficient documentation

## 2022-10-04 DIAGNOSIS — Z7984 Long term (current) use of oral hypoglycemic drugs: Secondary | ICD-10-CM | POA: Diagnosis not present

## 2022-10-04 DIAGNOSIS — R109 Unspecified abdominal pain: Secondary | ICD-10-CM | POA: Diagnosis present

## 2022-10-04 DIAGNOSIS — Z79899 Other long term (current) drug therapy: Secondary | ICD-10-CM | POA: Diagnosis not present

## 2022-10-04 LAB — CBC WITH DIFFERENTIAL/PLATELET
Abs Immature Granulocytes: 0.03 10*3/uL (ref 0.00–0.07)
Basophils Absolute: 0 10*3/uL (ref 0.0–0.1)
Basophils Relative: 0 %
Eosinophils Absolute: 0 10*3/uL (ref 0.0–0.5)
Eosinophils Relative: 1 %
HCT: 39.6 % (ref 39.0–52.0)
Hemoglobin: 13.2 g/dL (ref 13.0–17.0)
Immature Granulocytes: 0 %
Lymphocytes Relative: 13 %
Lymphs Abs: 1.1 10*3/uL (ref 0.7–4.0)
MCH: 27.7 pg (ref 26.0–34.0)
MCHC: 33.3 g/dL (ref 30.0–36.0)
MCV: 83.2 fL (ref 80.0–100.0)
Monocytes Absolute: 0.4 10*3/uL (ref 0.1–1.0)
Monocytes Relative: 4 %
Neutro Abs: 7 10*3/uL (ref 1.7–7.7)
Neutrophils Relative %: 82 %
Platelets: 360 10*3/uL (ref 150–400)
RBC: 4.76 MIL/uL (ref 4.22–5.81)
RDW: 12.8 % (ref 11.5–15.5)
WBC: 8.5 10*3/uL (ref 4.0–10.5)
nRBC: 0 % (ref 0.0–0.2)

## 2022-10-04 LAB — COMPREHENSIVE METABOLIC PANEL
ALT: 49 U/L — ABNORMAL HIGH (ref 0–44)
AST: 20 U/L (ref 15–41)
Albumin: 4.1 g/dL (ref 3.5–5.0)
Alkaline Phosphatase: 94 U/L (ref 38–126)
Anion gap: 10 (ref 5–15)
BUN: 20 mg/dL (ref 6–20)
CO2: 26 mmol/L (ref 22–32)
Calcium: 9.4 mg/dL (ref 8.9–10.3)
Chloride: 100 mmol/L (ref 98–111)
Creatinine, Ser: 1.56 mg/dL — ABNORMAL HIGH (ref 0.61–1.24)
GFR, Estimated: 51 mL/min — ABNORMAL LOW (ref >60)
Glucose, Bld: 249 mg/dL — ABNORMAL HIGH (ref 70–99)
Potassium: 3.9 mmol/L (ref 3.5–5.1)
Sodium: 136 mmol/L (ref 135–145)
Total Bilirubin: 1.1 mg/dL (ref 0.3–1.2)
Total Protein: 7.9 g/dL (ref 6.5–8.1)

## 2022-10-04 MED ORDER — KETOROLAC TROMETHAMINE 30 MG/ML IJ SOLN
15.0000 mg | Freq: Once | INTRAMUSCULAR | Status: AC
Start: 2022-10-04 — End: 2022-10-04
  Administered 2022-10-04: 15 mg via INTRAVENOUS
  Filled 2022-10-04: qty 1

## 2022-10-04 MED ORDER — HALOPERIDOL LACTATE 5 MG/ML IJ SOLN
2.5000 mg | Freq: Once | INTRAMUSCULAR | Status: AC
  Administered 2022-10-04: 2.5 mg via INTRAVENOUS
  Filled 2022-10-04: qty 1

## 2022-10-04 MED ORDER — SODIUM CHLORIDE 0.9 % IV BOLUS
1000.0000 mL | Freq: Once | INTRAVENOUS | Status: AC
Start: 2022-10-04 — End: 2022-10-04
  Administered 2022-10-04: 1000 mL via INTRAVENOUS

## 2022-10-04 MED ORDER — HALOPERIDOL LACTATE 5 MG/ML IJ SOLN: 2.5000 mg | Freq: Once | INTRAMUSCULAR | Status: DC | PRN

## 2022-10-04 MED ORDER — ALUM & MAG HYDROXIDE-SIMETH 200-200-20 MG/5ML PO SUSP
30.0000 mL | Freq: Once | ORAL | Status: AC
  Administered 2022-10-04: 30 mL via ORAL
  Filled 2022-10-04: qty 30

## 2022-10-04 MED ORDER — PROMETHAZINE HCL 25 MG RE SUPP: 25.0000 mg | Freq: Four times a day (QID) | RECTAL | 0 refills | Status: DC | PRN

## 2022-10-04 MED ORDER — LOSARTAN POTASSIUM 25 MG PO TABS
100.0000 mg | ORAL_TABLET | Freq: Once | ORAL | Status: AC
  Administered 2022-10-04: 100 mg via ORAL
  Filled 2022-10-04: qty 4

## 2022-10-04 MED ORDER — SUCRALFATE 1 G PO TABS: 1.0000 g | ORAL_TABLET | Freq: Three times a day (TID) | ORAL | 0 refills | Status: DC

## 2022-10-04 MED ORDER — HYDROCODONE-ACETAMINOPHEN 5-325 MG PO TABS
1.0000 | ORAL_TABLET | Freq: Once | ORAL | Status: AC
  Administered 2022-10-04: 1 via ORAL
  Filled 2022-10-04: qty 1

## 2022-10-04 NOTE — ED Triage Notes (Signed)
Pt presents d/t 2 months of abdominal pain w/ n/v and shob. Pt states this bout of shob began 2 days ago.

## 2022-10-04 NOTE — Discharge Instructions (Addendum)
You are seen in the ER for nausea and vomiting along with abdominal pain. Most likely your pain is because of gastroparesis. Take the medications as prescribed.  Follow-up with your GI doctor as planned.  Your blood pressure was noted to be elevated.  Please follow-up with your primary care doctor for optimal management of your blood pressure.  Start taking your BP medications as prescribed.

## 2022-10-04 NOTE — ED Provider Notes (Signed)
[facility]-EMERGENCY DEPT Provider Note   CSN: [id number] Arrival date & time: 10/04/22  1038     History  Chief Complaint  Patient presents with   Abdominal Pain    James Chang is a 56 y.o. male.  HPI     56 year old male comes in with chief complaint of abdominal pain. Patient has history of gastroparesis, cocaine use disorder, diabetes and episodes of recurrent abdominal pain.  Patient states that the current abdominal pain started 2 days ago.  Pain is waxing and waning in intensity.  No specific triggers.  He is unable to tolerate any food however, because of pain and nausea plus vomiting.  He denies any cocaine use.  Denies any chest pain, shortness of breath.  Patient is noted to have elevated blood pressure.  He denies any associated chest pain, headache, shortness of breath.  States that he has not taken his BP medications for over 10 days now.  Patient denies any diarrhea.  Last bowel movement was yesterday.  He is passing flatus.  Home Medications Prior to Admission medications   Medication Sig Start Date End Date Taking? Authorizing Provider  promethazine (PHENERGAN) 25 MG suppository Place 1 suppository (25 mg total) rectally every 6 (six) hours as needed for nausea. 10/04/22  Yes Derwood Kaplan, MD  sucralfate (CARAFATE) 1 g tablet Take 1 tablet (1 g total) by mouth 4 (four) times daily -  with meals and at bedtime. 10/04/22  Yes Rankin Coolman, MD  ABILIFY MAINTENA 400 MG PRSY prefilled syringe Inject 1 each into the muscle every 30 (thirty) days. 03/18/22   [provider]  alum & mag hydroxide-simeth (MAALOX PLUS) 400-400-40 MG/5ML suspension Take 15 mLs by mouth every 6 (six) hours as needed for indigestion. 05/28/22   Kommor, Madison, MD  atorvastatin (LIPITOR) 40 MG tablet Take 40 mg by mouth daily.    [provider]  busPIRone (BUSPAR) 15 MG tablet Take 15 mg by mouth 2 (two) times daily. 03/18/22   [provider]  diclofenac Sodium (VOLTAREN) 1 % GEL Apply 1 application. topically daily as needed (For pain). 03/18/22   [provider]  dicyclomine (BENTYL) 20 MG tablet Take 1 tablet (20 mg total) by mouth 2 (two) times daily. 05/28/22   Kommor, Madison, MD  DULoxetine (CYMBALTA) 20 MG capsule Take 20 mg by mouth daily. 01/16/22   [provider]  famotidine (PEPCID) 20 MG tablet Take 1 tablet (20 mg total) by mouth 2 (two) times daily. 05/28/22   Kommor, Madison, MD  gabapentin (NEURONTIN) 300 MG capsule Take 300 mg by mouth daily as needed (For back pain). 03/18/22   [provider]  insulin glargine (LANTUS SOLOSTAR) 100 UNIT/ML Solostar Pen Inject 40 Units into the skin daily. 04/24/18   [provider]  JARDIANCE 25 MG TABS tablet Take 25 mg by mouth daily. 02/04/22   [provider]  LINZESS 145 MCG CAPS capsule Take 145 mcg by mouth daily. 03/18/22   [provider]  losartan (COZAAR) 100 MG tablet Take 100 mg by mouth daily. 05/13/22   [provider]  ondansetron (ZOFRAN) 4 MG tablet Take 4 mg by mouth every 8 (eight) hours as needed for nausea/vomiting. 05/13/22   [provider]  ondansetron (ZOFRAN-ODT) 4 MG disintegrating tablet Take 1 tablet (4 mg total) by mouth every 8 (eight) hours as needed for nausea or vomiting. 05/26/22   Cheryll Cockayne, MD  pantoprazole (PROTONIX) 40 MG tablet  Take 40 mg by mouth daily. 08/08/20   [provider]  prochlorperazine (COMPAZINE) 10 MG tablet Take 1 tablet (10 mg total) by mouth 2 (two) times daily as needed for nausea or vomiting. 12/24/21   Arthor Captain, PA-C  tiZANidine (ZANAFLEX) 4 MG tablet Take 4 mg by mouth 3 (three) times daily. 01/29/22   [provider]      Allergies    Metformin and related and Reglan [metoclopramide]    Review of Systems   Review of Systems  Physical Exam Updated Vital Signs BP (!) 177/93   Pulse 81   Temp 98.4 F (36.9 C) (Oral)    Resp 12   Ht 5\' 6"  (1.676 m)   Wt 78.9 kg   SpO2 98%   BMI 28.08 kg/m  Physical Exam Vitals and nursing note reviewed.  Constitutional:      Appearance: He is well-developed.  HENT:     Head: Atraumatic.  Cardiovascular:     Rate and Rhythm: Normal rate.  Pulmonary:     Effort: Pulmonary effort is normal.  Abdominal:     Tenderness: There is abdominal tenderness in the right upper quadrant, epigastric area and left upper quadrant. There is no guarding or rebound.  Musculoskeletal:     Cervical back: Neck supple.  Skin:    General: Skin is warm.  Neurological:     Mental Status: He is alert and oriented to person, place, and time.     ED Results / Procedures / Treatments   Labs (all labs ordered are listed, but only abnormal results are displayed) Labs Reviewed  COMPREHENSIVE METABOLIC PANEL - Abnormal; Notable for the following components:      Result Value   Glucose, Bld 249 (*)    Creatinine, Ser 1.56 (*)    ALT 49 (*)    GFR, Estimated 51 (*)    All other components within normal limits  CBC WITH DIFFERENTIAL/PLATELET    EKG None  Radiology No results found.  Procedures Procedures    Medications Ordered in ED Medications  haloperidol lactate (HALDOL) injection 2.5 mg (has no administration in time range)  haloperidol lactate (HALDOL) injection 2.5 mg (2.5 mg Intravenous Given 10/04/22 1125)  alum & mag hydroxide-simeth (MAALOX/MYLANTA) 200-200-20 MG/5ML suspension 30 mL (30 mLs Oral Given 10/04/22 1128)  sodium chloride 0.9 % bolus 1,000 mL (0 mLs Intravenous Stopped 10/04/22 1255)  HYDROcodone-acetaminophen (NORCO/VICODIN) 5-325 MG per tablet 1 tablet (1 tablet Oral Given 10/04/22 1259)  losartan (COZAAR) tablet 100 mg (100 mg Oral Given 10/04/22 1259)  ketorolac (TORADOL) 30 MG/ML injection 15 mg (15 mg Intravenous Given 10/04/22 1454)    ED Course/ Medical Decision Making/ A&P Clinical Course as of 10/04/22 1526  Fri Oct 04, 2022  1523 Patient  was reassessed multiple times.  At the time of discharge, he informed nursing staff that he did not feel safe going home.  I saw the patient again.  He states that he thinks his vomiting will get worse upon discharge.  Nursing staff has not appreciated any emesis here.  Labs are reassuring.  No evidence of renal failure.  Patient's blood pressure has come down after he received BP medication.  He then told me that he felt unsafe because of depression.  He has no active suicide plan.  He did not mention this complaint until he was discharged.  He was discharged at 3 PM is supposed to 2 PM, at his request.  I do not think patient is  serious about suicidal ideation, there is secondary gain related to it or situational depression.  There could be substance related mood disorder as well.  I asked patient if there is any barrier for him to be discharged, to which he responded ride.  I told him to refrain from drugs and to return to the ER if his symptoms get worse.  He then asked me to call his friend, who can possibly pick him up.  I informed the nursing staff, if patient needs, they can provide him with a bus pass. [AN]    Clinical Course User Index [AN] Derwood Kaplan, MD                           Medical Decision Making Amount and/or Complexity of Data Reviewed Labs: ordered.  Risk OTC drugs. Prescription drug management.   56 year old patient comes in with chief complaint of abdominal pain in the epigastric region.  He has history of gastroparesis, hypertension and cocaine use disorder.  Patient also found to be hypertensive without any associated symptoms.  He denies any cocaine use.  Differential diagnosis considered includes aortic dissection, ACS, gastroparesis, small bowel obstruction, peptic ulcer disease, cholecystitis.  Patient's chart reviewed.  He has had similar complaints on 3 separate occasions within the last 6 months.  CT abdomen pelvis with contrast were completed in May, July and  then again in August.  Patient states that his pain is similar to those previous episodes.  Clinically, patient does not have any peritonitis.  His vascular exam is reassuring. We will start him on medications for gastroparesis, p.o. challenge initiated.  I will give him his blood pressure medicine here, advised that he starts taking his BP medications that have been prescribed.  Patient states that he just stopped taking the BP medication " just because".    Final Clinical Impression(s) / ED Diagnoses Final diagnoses:  Gastroparesis  Uncontrolled hypertension    Rx / DC Orders ED Discharge Orders          Ordered    promethazine (PHENERGAN) 25 MG suppository  Every 6 hours PRN        10/04/22 1446    sucralfate (CARAFATE) 1 g tablet  3 times daily with meals & bedtime        10/04/22 1448              Derwood Kaplan, MD 10/04/22 1526

## 2023-03-02 ENCOUNTER — Emergency Department (HOSPITAL_COMMUNITY): Payer: 59

## 2023-03-02 ENCOUNTER — Emergency Department (HOSPITAL_COMMUNITY)
Admission: EM | Admit: 2023-03-02 | Discharge: 2023-03-02 | Disposition: A | Discharge status: Home / Self Care | Payer: 59 | Attending: Emergency Medicine | Admitting: Emergency Medicine

## 2023-03-02 DIAGNOSIS — R1013 Epigastric pain: Secondary | ICD-10-CM | POA: Insufficient documentation

## 2023-03-02 DIAGNOSIS — N189 Chronic kidney disease, unspecified: Secondary | ICD-10-CM | POA: Diagnosis not present

## 2023-03-02 DIAGNOSIS — E1122 Type 2 diabetes mellitus with diabetic chronic kidney disease: Secondary | ICD-10-CM | POA: Insufficient documentation

## 2023-03-02 DIAGNOSIS — I129 Hypertensive chronic kidney disease with stage 1 through stage 4 chronic kidney disease, or unspecified chronic kidney disease: Secondary | ICD-10-CM | POA: Diagnosis not present

## 2023-03-02 DIAGNOSIS — R079 Chest pain, unspecified: Secondary | ICD-10-CM | POA: Diagnosis present

## 2023-03-02 LAB — COMPREHENSIVE METABOLIC PANEL
ALT: 15 U/L (ref 0–44)
AST: 13 U/L — ABNORMAL LOW (ref 15–41)
Albumin: 3.6 g/dL (ref 3.5–5.0)
Alkaline Phosphatase: 65 U/L (ref 38–126)
Anion gap: 11 (ref 5–15)
BUN: 24 mg/dL — ABNORMAL HIGH (ref 6–20)
CO2: 24 mmol/L (ref 22–32)
Calcium: 9.4 mg/dL (ref 8.9–10.3)
Chloride: 98 mmol/L (ref 98–111)
Creatinine, Ser: 1.74 mg/dL — ABNORMAL HIGH (ref 0.61–1.24)
GFR, Estimated: 45 mL/min — ABNORMAL LOW (ref >60)
Glucose, Bld: 293 mg/dL — ABNORMAL HIGH (ref 70–99)
Potassium: 4.5 mmol/L (ref 3.5–5.1)
Sodium: 133 mmol/L — ABNORMAL LOW (ref 135–145)
Total Bilirubin: 1.2 mg/dL (ref 0.3–1.2)
Total Protein: 7 g/dL (ref 6.5–8.1)

## 2023-03-02 LAB — CBG MONITORING, ED
Glucose-Capillary: 272 mg/dL — ABNORMAL HIGH (ref 70–99)
Glucose-Capillary: 189 mg/dL — ABNORMAL HIGH (ref 70–99)

## 2023-03-02 LAB — CBC
HCT: 39.6 % (ref 39.0–52.0)
Hemoglobin: 13.2 g/dL (ref 13.0–17.0)
MCH: 27.8 pg (ref 26.0–34.0)
MCHC: 33.3 g/dL (ref 30.0–36.0)
MCV: 83.4 fL (ref 80.0–100.0)
Platelets: 303 10*3/uL (ref 150–400)
RBC: 4.75 MIL/uL (ref 4.22–5.81)
RDW: 13.1 % (ref 11.5–15.5)
WBC: 10.4 10*3/uL (ref 4.0–10.5)
nRBC: 0 % (ref 0.0–0.2)

## 2023-03-02 LAB — TROPONIN I (HIGH SENSITIVITY)
Troponin I (High Sensitivity): 15 ng/L (ref <18)
Troponin I (High Sensitivity): 18 ng/L — ABNORMAL HIGH (ref <18)

## 2023-03-02 LAB — LIPASE, BLOOD: Lipase: 22 U/L (ref 11–51)

## 2023-03-02 MED ORDER — ALUM & MAG HYDROXIDE-SIMETH 200-200-20 MG/5ML PO SUSP
30.0000 mL | Freq: Once | ORAL | Status: AC
  Administered 2023-03-02: 30 mL via ORAL
  Filled 2023-03-02: qty 30

## 2023-03-02 MED ORDER — LACTATED RINGERS IV BOLUS
1000.0000 mL | Freq: Once | INTRAVENOUS | Status: AC
  Administered 2023-03-02: 1000 mL via INTRAVENOUS

## 2023-03-02 MED ORDER — IOHEXOL 350 MG/ML SOLN
75.0000 mL | Freq: Once | INTRAVENOUS | Status: AC | PRN
  Administered 2023-03-02: 75 mL via INTRAVENOUS

## 2023-03-02 MED ORDER — DICYCLOMINE HCL 10 MG/5ML PO SOLN
10.0000 mg | Freq: Once | ORAL | Status: AC
  Administered 2023-03-02: 10 mg via ORAL
  Filled 2023-03-02: qty 5

## 2023-03-02 MED ORDER — LACTATED RINGERS IV BOLUS: 1000.0000 mL | Freq: Once | INTRAVENOUS | Status: DC

## 2023-03-02 MED ORDER — MORPHINE SULFATE (PF) 4 MG/ML IV SOLN
4.0000 mg | Freq: Once | INTRAVENOUS | Status: AC
  Administered 2023-03-02: 4 mg via INTRAVENOUS
  Filled 2023-03-02: qty 1

## 2023-03-02 NOTE — Discharge Instructions (Addendum)
You came to the emergency department today with lower chest/upper abdominal pain.  This is likely secondary to your chronic GI issues, potentially a bout of gastroparesis.  You felt better after morphine and a GI cocktail.  Continue to take the Maalox and Bentyl at home.  Additionally, you appear fairly constipated, continue with your outpatient constipation regimen.  Do not hesitate to return with any worsening symptoms.  It was a pleasure to meet you and we hope you feel better!

## 2023-03-02 NOTE — ED Provider Notes (Signed)
[place] EMERGENCY DEPARTMENT AT [facility] Provider Note   CSN: [id number] Arrival date & time: 03/02/23  1101     History No chief complaint on file.   James Chang is a 57 y.o. male with a past medical history of hypertension, hyperlipidemia, CKD, substance use disorder to include cocaine use, type 2 diabetes and schizoaffective disorder presenting today due to chest pain.  Reports on Thursday he started to have some pain in the "lower chest" that seem to also be in his shoulder and his back.  Denies a history of alcohol use.  Additionally he reports he has been vomiting for the past couple of days.  Has not had a bowel movement in 3 to 4 days which he says is normal for him.  Still passing gas.  No dysuria or hematuria.  No history of ACS.  Tells me that he does not take his blood pressure or blood sugar at home.  Says that he went to see his PCP on Friday due to his discomfort and they did lab work and an EKG.  The EKG was normal but he says he has not received the results of the lab work.  HPI     Home Medications Prior to Admission medications   Medication Sig Start Date End Date Taking? Authorizing Provider  ABILIFY MAINTENA 400 MG PRSY prefilled syringe Inject 1 each into the muscle every 30 (thirty) days. 03/18/22   [provider]  alum & mag hydroxide-simeth (MAALOX PLUS) 400-400-40 MG/5ML suspension Take 15 mLs by mouth every 6 (six) hours as needed for indigestion. 05/28/22   Kommor, Abdelaziz Westenberger, MD  atorvastatin (LIPITOR) 40 MG tablet Take 40 mg by mouth daily.    [provider]  busPIRone (BUSPAR) 15 MG tablet Take 15 mg by mouth 2 (two) times daily. 03/18/22   [provider]  diclofenac Sodium (VOLTAREN) 1 % GEL Apply 1 application. topically daily as needed (For pain). 03/18/22   [provider]  dicyclomine (BENTYL) 20 MG tablet Take 1 tablet (20 mg total) by mouth 2 (two) times daily. 05/28/22   Kommor, Achaia Garlock, MD  DULoxetine  (CYMBALTA) 20 MG capsule Take 20 mg by mouth daily. 01/16/22   [provider]  famotidine (PEPCID) 20 MG tablet Take 1 tablet (20 mg total) by mouth 2 (two) times daily. 05/28/22   Kommor, Niko Penson, MD  gabapentin (NEURONTIN) 300 MG capsule Take 300 mg by mouth daily as needed (For back pain). 03/18/22   [provider]  insulin glargine (LANTUS SOLOSTAR) 100 UNIT/ML Solostar Pen Inject 40 Units into the skin daily. 04/24/18   [provider]  JARDIANCE 25 MG TABS tablet Take 25 mg by mouth daily. 02/04/22   [provider]  LINZESS 145 MCG CAPS capsule Take 145 mcg by mouth daily. 03/18/22   [provider]  losartan (COZAAR) 100 MG tablet Take 100 mg by mouth daily. 05/13/22   [provider]  ondansetron (ZOFRAN) 4 MG tablet Take 4 mg by mouth every 8 (eight) hours as needed for nausea/vomiting. 05/13/22   [provider]  ondansetron (ZOFRAN-ODT) 4 MG disintegrating tablet Take 1 tablet (4 mg total) by mouth every 8 (eight) hours as needed for nausea or vomiting. 05/26/22   Cheryll Cockayne, MD  pantoprazole (PROTONIX) 40 MG tablet Take 40 mg by mouth daily. 08/08/20   [provider]  prochlorperazine (COMPAZINE) 10 MG tablet Take 1 tablet (10 mg total) by mouth 2 (two) times  daily as needed for nausea or vomiting. 12/24/21   Arthor Captain, PA-C  promethazine (PHENERGAN) 25 MG suppository Place 1 suppository (25 mg total) rectally every 6 (six) hours as needed for nausea. 10/04/22   Derwood Kaplan, MD  sucralfate (CARAFATE) 1 g tablet Take 1 tablet (1 g total) by mouth 4 (four) times daily -  with meals and at bedtime. 10/04/22   Derwood Kaplan, MD  tiZANidine (ZANAFLEX) 4 MG tablet Take 4 mg by mouth 3 (three) times daily. 01/29/22   [provider]      Allergies    Metformin and related and Reglan [metoclopramide]    Review of Systems   Review of Systems  Physical Exam Updated Vital Signs There were no vitals taken for  this visit. Physical Exam Vitals and nursing note reviewed.  Constitutional:      General: He is not in acute distress.    Appearance: Normal appearance. He is normal weight. He is not ill-appearing.  HENT:     Head: Normocephalic and atraumatic.  Eyes:     General: No scleral icterus.    Conjunctiva/sclera: Conjunctivae normal.  Cardiovascular:     Rate and Rhythm: Normal rate and regular rhythm.     Heart sounds: Normal heart sounds.  Pulmonary:     Effort: Pulmonary effort is normal. No respiratory distress.     Breath sounds: Normal breath sounds.  Chest:     Chest wall: No mass or tenderness.  Abdominal:     Palpations: Abdomen is soft.     Tenderness: There is abdominal tenderness (Epigastric).  Musculoskeletal:     Right lower leg: No edema.     Left lower leg: No edema.  Skin:    General: Skin is warm and dry.     Findings: No rash.  Neurological:     Mental Status: He is alert.  Psychiatric:        Mood and Affect: Mood normal.     ED Results / Procedures / Treatments   Labs (all labs ordered are listed, but only abnormal results are displayed) Labs Reviewed - No data to display  EKG None  Radiology No results found.  Procedures Procedures    Medications Ordered in ED Medications - No data to display  ED Course/ Medical Decision Making/ A&P                             Medical Decision Making Amount and/or Complexity of Data Reviewed Labs: ordered. Radiology: ordered.  Risk OTC drugs. Prescription drug management.   57 year old male who presents today with chest/abdominal pain.  Appears to be localize to the epigastrium.  Differential includes but is not limited to, ACS, AAA, pancreatitis, PUD, gastroparesis, cholecystitis, hepatitis, gastroenteritis, bowel obstruction.  This is not exhaustive.   this is not an exhaustive .    Past Medical History / Co-morbidities / Social History: Hypertension, hyperlipidemia, CKD, substance use  disorder   Additional history:   Per chart review patient follows with [facility] for idiopathic constipation.  Also has had multiple visits to our emergency department for epigastric pain and gastroparesis.  Last CT scan 08/31/2022.  Showed an area of potential colonic neoplasm.  Recommendation was for outpatient colonoscopy.  He was subsequently seen by [facility] GI without any further concerns outside of his chronic constipation.   Physical Exam: Pertinent physical exam findings include Epigastric tenderness, no reproducible chest wall tenderness  Lab  Tests: I ordered, and personally interpreted labs.  The pertinent results include: Glucose 272 Normal white count Creatinine 1.74, around patient's baseline with CKD Normal AGAP  Imaging Studies: CXR: Negative CTAP: Nonacute, constipation as already known   Cardiac Monitoring:  The patient was maintained on a cardiac monitor.  I viewed and interpreted the cardiac monitored which showed an underlying rhythm of: Sinus   Medications: I ordered medication including morphine and on reevaluation patient felt much better.  Was then given a GI cocktail as well.  Fluids and repeat blood sugar in 180s.  MDM/Disposition: This is a 57 year old male who presented today with chest/abdominal pain.  On physical exam it was mostly in the epigastrium.  Differential was broad.  He does have an extensive history of constipation followed by GI.  He also has diabetes that is poorly controlled and he has had multiple bouts of gastroparesis.  Based on my physical exam and his history I suspect patient is having some degree of GERD.  Also may have mild gastroparesis causing the nausea and episodes of emesis over the past couple of days.  Blood sugar around 300, he says he does not check this at home.  Normal white count, CT scan nonacute, showing the constipation that we were already aware of.  Patient felt much better after morphine and even better  after GI cocktail.  Troponin negative x 2 with a nonischemic EKG, do not believe this is ACS.  He is stable and agreeable to discharge home.  He will follow-up with the gastroenterology team.  Already prescribed Maalox, Bentyl and PPI.  He will continue with these   Final Clinical Impression(s) / ED Diagnoses Final diagnoses:  Epigastric abdominal pain    Rx / DC Orders ED Discharge Orders     None      Results and diagnoses were explained to the patient. Return precautions discussed in full. Patient had no additional questions and expressed complete understanding.   This chart was dictated using voice recognition software.  Despite best efforts to proofread,  errors can occur which can change the documentation meaning.     Woodroe Chen 03/02/23 1743    Rondel Baton, MD 03/02/23 2137

## 2023-03-02 NOTE — ED Triage Notes (Signed)
Pt BIB EMS for chest pain, N/V, and hypoglycemia since Friday. Went to PCP and had normal EKG. Has not been eating or drinking much and has not had a bowel movement in several days. C/o chills but no fever. Aox4. Hx of HTN and DM.

## 2023-03-10 ENCOUNTER — Encounter (HOSPITAL_COMMUNITY): Payer: Self-pay

## 2023-03-10 ENCOUNTER — Emergency Department (HOSPITAL_COMMUNITY): Payer: 59

## 2023-03-10 ENCOUNTER — Emergency Department (HOSPITAL_COMMUNITY)
Admission: EM | Admit: 2023-03-10 | Discharge: 2023-03-10 | Disposition: A | Discharge status: Home / Self Care | Payer: 59 | Attending: Student | Admitting: Student

## 2023-03-10 ENCOUNTER — Other Ambulatory Visit: Payer: Self-pay

## 2023-03-10 DIAGNOSIS — N1831 Chronic kidney disease, stage 3a: Secondary | ICD-10-CM | POA: Diagnosis not present

## 2023-03-10 DIAGNOSIS — E1122 Type 2 diabetes mellitus with diabetic chronic kidney disease: Secondary | ICD-10-CM | POA: Insufficient documentation

## 2023-03-10 DIAGNOSIS — I129 Hypertensive chronic kidney disease with stage 1 through stage 4 chronic kidney disease, or unspecified chronic kidney disease: Secondary | ICD-10-CM | POA: Diagnosis not present

## 2023-03-10 DIAGNOSIS — Z79899 Other long term (current) drug therapy: Secondary | ICD-10-CM | POA: Diagnosis not present

## 2023-03-10 DIAGNOSIS — F1490 Cocaine use, unspecified, uncomplicated: Secondary | ICD-10-CM

## 2023-03-10 DIAGNOSIS — D649 Anemia, unspecified: Secondary | ICD-10-CM | POA: Diagnosis not present

## 2023-03-10 DIAGNOSIS — R0789 Other chest pain: Secondary | ICD-10-CM | POA: Diagnosis present

## 2023-03-10 DIAGNOSIS — R079 Chest pain, unspecified: Secondary | ICD-10-CM

## 2023-03-10 DIAGNOSIS — Z794 Long term (current) use of insulin: Secondary | ICD-10-CM | POA: Insufficient documentation

## 2023-03-10 DIAGNOSIS — R1013 Epigastric pain: Secondary | ICD-10-CM | POA: Diagnosis not present

## 2023-03-10 LAB — BASIC METABOLIC PANEL
Anion gap: 8 (ref 5–15)
BUN: 27 mg/dL — ABNORMAL HIGH (ref 6–20)
CO2: 27 mmol/L (ref 22–32)
Calcium: 9.1 mg/dL (ref 8.9–10.3)
Chloride: 96 mmol/L — ABNORMAL LOW (ref 98–111)
Creatinine, Ser: 2.26 mg/dL — ABNORMAL HIGH (ref 0.61–1.24)
GFR, Estimated: 33 mL/min — ABNORMAL LOW (ref >60)
Glucose, Bld: 321 mg/dL — ABNORMAL HIGH (ref 70–99)
Potassium: 3.8 mmol/L (ref 3.5–5.1)
Sodium: 131 mmol/L — ABNORMAL LOW (ref 135–145)

## 2023-03-10 LAB — CBC
HCT: 35.6 % — ABNORMAL LOW (ref 39.0–52.0)
Hemoglobin: 12.1 g/dL — ABNORMAL LOW (ref 13.0–17.0)
MCH: 28 pg (ref 26.0–34.0)
MCHC: 34 g/dL (ref 30.0–36.0)
MCV: 82.4 fL (ref 80.0–100.0)
Platelets: 284 10*3/uL (ref 150–400)
RBC: 4.32 MIL/uL (ref 4.22–5.81)
RDW: 13 % (ref 11.5–15.5)
WBC: 10.1 10*3/uL (ref 4.0–10.5)
nRBC: 0 % (ref 0.0–0.2)

## 2023-03-10 LAB — TROPONIN I (HIGH SENSITIVITY)
Troponin I (High Sensitivity): 13 ng/L (ref <18)
Troponin I (High Sensitivity): 13 ng/L (ref <18)

## 2023-03-10 MED ORDER — LORAZEPAM 2 MG/ML IJ SOLN
0.5000 mg | Freq: Once | INTRAMUSCULAR | Status: AC
  Administered 2023-03-10: 0.5 mg via INTRAVENOUS
  Filled 2023-03-10: qty 1

## 2023-03-10 MED ORDER — IOHEXOL 350 MG/ML SOLN
75.0000 mL | Freq: Once | INTRAVENOUS | Status: AC | PRN
  Administered 2023-03-10: 75 mL via INTRAVENOUS

## 2023-03-10 MED ORDER — LACTATED RINGERS IV BOLUS
1000.0000 mL | Freq: Once | INTRAVENOUS | Status: AC
  Administered 2023-03-10: 1000 mL via INTRAVENOUS

## 2023-03-10 MED ORDER — DROPERIDOL 2.5 MG/ML IJ SOLN
1.2500 mg | Freq: Once | INTRAMUSCULAR | Status: AC
  Administered 2023-03-10: 1.25 mg via INTRAVENOUS
  Filled 2023-03-10: qty 2

## 2023-03-10 MED ORDER — OXYCODONE-ACETAMINOPHEN 5-325 MG PO TABS: 1.0000 | ORAL_TABLET | Freq: Once | ORAL | Status: DC

## 2023-03-10 NOTE — ED Notes (Signed)
Attempted to obtain troponin level unable to get level at this time. Phlebotomy asked for assistance.

## 2023-03-10 NOTE — ED Triage Notes (Signed)
BIB EMS for crushing chest pain radiating to R arm, hyperglycemia (389),  throbbing abd pain started 1800.   0.4 Nitro   97/58 after nitro

## 2023-03-10 NOTE — ED Notes (Addendum)
Pt given meal and beverage

## 2023-03-10 NOTE — ED Provider Notes (Signed)
[place] EMERGENCY DEPARTMENT AT [facility] Provider Note  CSN: [id number] Arrival date & time: 03/10/23 4098  Chief Complaint(s) Chest Pain  HPI James Chang is a 57 y.o. male with PMH polysubstance abuse and crack cocaine use, CKD 3, HTN, HLD, previous CVA who presents emergency department for evaluation of chest and abdominal pain.  Patient states that symptoms began abruptly yesterday with lower chest pain radiating up into the neck and epigastric abdominal pain.  He states that he did use crack cocaine yesterday and does this because it "numbs the pain".  Here in the emergency room, he endorses persistent chest pain, right arm numbness, epigastric abdominal pain and is actively vomiting.  Describes the pain as a sharp stabbing pain.  Denies shortness of breath, diarrhea, headache, fever or other systemic symptoms.   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   Substance induced mood disorder (HCC) 07/19/2020   Nausea and vomiting 12/26/2019   Vitamin D deficiency 12/23/2019   Diabetic gastroparesis (HCC) 10/28/2019   Constipation 10/28/2019   Proliferative diabetic retinopathy of left eye with macular edema associated with type 2 diabetes mellitus (HCC) 05/19/2019   Bipolar affective disorder in remission (HCC) 03/30/2019   Diabetic retinopathy of right eye with macular edema associated with type 2 diabetes mellitus (HCC) 03/30/2019    Fatigue 03/30/2019   Snoring 03/30/2019   Uncontrolled type 2 diabetes mellitus with hyperglycemia (HCC) 03/30/2019   Cocaine use disorder, severe, dependence (HCC) 01/14/2019   Bursitis of right foot 09/29/2018   Metatarsalgia of right foot 09/29/2018   Biceps tendonitis on right 12/11/2017   Tendinitis of right rotator cuff 11/26/2017   Noncompliance with diabetes treatment 12/26/2016   Bulging of lumbar intervertebral disc 12/06/2016   Hammer toe of left foot 03/12/2016   Current use of insulin (HCC) 01/01/2016   Osteoarthritis of spine with radiculopathy, lumbar region 11/15/2014   Chronic pain syndrome 08/18/2014   Diabetes, polyneuropathy (HCC) 08/18/2014   Esophageal reflux 12/16/2013   Schizoaffective disorder (HCC) 04/17/2013   Peripheral neuropathy 11/24/2012   Hyperlipidemia 09/30/2012   Chronic low back pain 09/30/2012   Low testosterone 09/30/2012   Depression 09/30/2012   History of stroke 09/30/2012   HTN (hypertension) 09/30/2012   Home Medication(s) Prior to Admission medications   Medication Sig Start Date End Date Taking? Authorizing Provider  ABILIFY MAINTENA 400 MG PRSY prefilled syringe Inject 1 each into the muscle every 30 (thirty) days. 03/18/22   [provider]  alum & mag hydroxide-simeth (MAALOX PLUS) 400-400-40 MG/5ML suspension Take 15 mLs by mouth every 6 (six) hours as needed for indigestion. 05/28/22   Krithi Bray, MD  atorvastatin (LIPITOR) 40 MG tablet Take 40 mg by mouth daily.    [provider]  busPIRone (BUSPAR) 15 MG tablet Take 15 mg by mouth 2 (two) times daily. 03/18/22   [provider]  diclofenac Sodium (VOLTAREN)  1 % GEL Apply 1 application. topically daily as needed (For pain). 03/18/22   [provider]  dicyclomine (BENTYL) 20 MG tablet Take 1 tablet (20 mg total) by mouth 2 (two) times daily. 05/28/22   Gearldean Lomanto, MD  DULoxetine (CYMBALTA) 20 MG capsule Take 20 mg by mouth daily. 01/16/22    [provider]  famotidine (PEPCID) 20 MG tablet Take 1 tablet (20 mg total) by mouth 2 (two) times daily. 05/28/22   Rehmat Murtagh, MD  gabapentin (NEURONTIN) 300 MG capsule Take 300 mg by mouth daily as needed (For back pain). 03/18/22   [provider]  insulin glargine (LANTUS SOLOSTAR) 100 UNIT/ML Solostar Pen Inject 40 Units into the skin daily. 04/24/18   [provider]  JARDIANCE 25 MG TABS tablet Take 25 mg by mouth daily. 02/04/22   [provider]  LINZESS 145 MCG CAPS capsule Take 145 mcg by mouth daily. 03/18/22   [provider]  losartan (COZAAR) 100 MG tablet Take 100 mg by mouth daily. 05/13/22   [provider]  ondansetron (ZOFRAN) 4 MG tablet Take 4 mg by mouth every 8 (eight) hours as needed for nausea/vomiting. 05/13/22   [provider]  ondansetron (ZOFRAN-ODT) 4 MG disintegrating tablet Take 1 tablet (4 mg total) by mouth every 8 (eight) hours as needed for nausea or vomiting. 05/26/22   Cheryll Cockayne, MD  pantoprazole (PROTONIX) 40 MG tablet Take 40 mg by mouth daily. 08/08/20   [provider]  prochlorperazine (COMPAZINE) 10 MG tablet Take 1 tablet (10 mg total) by mouth 2 (two) times daily as needed for nausea or vomiting. 12/24/21   Arthor Captain, PA-C  promethazine (PHENERGAN) 25 MG suppository Place 1 suppository (25 mg total) rectally every 6 (six) hours as needed for nausea. 10/04/22   Derwood Kaplan, MD  sucralfate (CARAFATE) 1 g tablet Take 1 tablet (1 g total) by mouth 4 (four) times daily -  with meals and at bedtime. 10/04/22   Derwood Kaplan, MD  tiZANidine (ZANAFLEX) 4 MG tablet Take 4 mg by mouth 3 (three) times daily. 01/29/22   [provider]                                                                                                                                    Past Surgical History Past Surgical History:  Procedure Laterality Date   NO PAST SURGERIES     Family  History Family History  Problem Relation Age of Onset   Arthritis Mother    Stroke Father    Hypertension Father    Kidney disease Father    Heart attack Father    Cancer Neg Hx    Diabetes Neg Hx     Social History Social History   Tobacco Use   Smoking status: Never   Smokeless tobacco: Never  Vaping Use   Vaping Use: Never used  Substance  Use Topics   Alcohol use: Not Currently    Comment: seldom 1-2 times a month beer   Drug use: Yes    Types: Cocaine, "Crack" cocaine    Comment: "a couple days ago"   Allergies Metformin and related and Reglan [metoclopramide]  Review of Systems Review of Systems  Respiratory:  Positive for chest tightness.   Cardiovascular:  Positive for chest pain.  Gastrointestinal:  Positive for abdominal pain, nausea and vomiting.  Neurological:  Positive for numbness.    Physical Exam Vital Signs  I have reviewed the triage vital signs BP (!) 191/110   Pulse 91   Temp 98.1 F (36.7 C) (Oral)   Resp 14   Ht 5\' 6"  (1.676 m)   Wt 81.2 kg   SpO2 100%   BMI 28.89 kg/m   Physical Exam Constitutional:      General: He is not in acute distress.    Appearance: Normal appearance. He is ill-appearing.  HENT:     Head: Normocephalic and atraumatic.     Nose: No congestion or rhinorrhea.  Eyes:     General:        Right eye: No discharge.        Left eye: No discharge.     Extraocular Movements: Extraocular movements intact.     Pupils: Pupils are equal, round, and reactive to light.  Cardiovascular:     Rate and Rhythm: Normal rate and regular rhythm.     Heart sounds: No murmur heard. Pulmonary:     Effort: No respiratory distress.     Breath sounds: No wheezing or rales.  Abdominal:     General: There is no distension.     Tenderness: There is no abdominal tenderness.  Musculoskeletal:        General: Normal range of motion.     Cervical back: Normal range of motion.  Skin:    General: Skin is warm and dry.  Neurological:      General: No focal deficit present.     Mental Status: He is alert.     ED Results and Treatments Labs (all labs ordered are listed, but only abnormal results are displayed) Labs Reviewed  BASIC METABOLIC PANEL - Abnormal; Notable for the following components:      Result Value   Sodium 131 (*)    Chloride 96 (*)    Glucose, Bld 321 (*)    BUN 27 (*)    Creatinine, Ser 2.26 (*)    GFR, Estimated 33 (*)    All other components within normal limits  CBC - Abnormal; Notable for the following components:   Hemoglobin 12.1 (*)    HCT 35.6 (*)    All other components within normal limits  RAPID URINE DRUG SCREEN, HOSP PERFORMED  TROPONIN I (HIGH SENSITIVITY)  Radiology No results found.  Pertinent labs & imaging results that were available during my care of the patient were reviewed by me and considered in my medical decision making (see MDM for details).  Medications Ordered in ED Medications  lactated ringers bolus 1,000 mL (has no administration in time range)                                                                                                                                     Procedures Procedures  (including critical care time)  Medical Decision Making / ED Course   This patient presents to the ED for concern of chest and abdominal pain, this involves an extensive number of treatment options, and is a complaint that carries with it a high risk of complications and morbidity.  The differential diagnosis includes ACS, PE, dissection, gastritis, Prinzmetal angina, vasospasm, esophageal spasm, intra-abdominal infection, pancreatitis, pneumonia  MDM: Patient seen emergency room for evaluation of chest and abdominal pain.  Physical exam reveals an uncomfortable appearing patient with epigastric tenderness to palpation but is otherwise  unremarkable.  No focal motor or neurodeficits.  Pulmonary exam unremarkable.  Laboratory evaluation with a hemoglobin of 12.1, BUN 27, creatinine 2.26 which is a mild increase from patient's baseline of 1.74 8 days ago.  Patient fluid resuscitated.  High-sensitivity troponin and delta troponin unremarkable.  ECG nonischemic.  Chest x-ray unremarkable.  Given both chest pain radiating up into the neck and abdominal pain with persistent hypertension, concern for aortic dissection and the dissection scan was obtained that was reassuringly negative for acute findings.  Patient given droperidol and Ativan for abdominal pain and on reevaluation his symptoms have completely resolved.  Vital signs improved and patient able to tolerate p.o. without difficulty.  With symptoms completely resolved, I have a lower suspicion for cardiac pathology and patient's heart score is 3.  Patient extensively counseled to stop crack cocaine use as this will increase mortality and the likelihood of a cardiac event.  An ambulatory referral to cardiology was placed, and patient encouraged to call his primary care physician for creatinine recheck next week.  Home fluid resuscitation encouraged.  Patient then discharged with outpatient cardiology and PCP follow-up.  Return precautions given which patient voiced understanding.   Additional history obtained:  -External records from outside source obtained and reviewed including: Chart review including previous notes, labs, imaging, consultation notes   Lab Tests: -I ordered, reviewed, and interpreted labs.   The pertinent results include:   Labs Reviewed  BASIC METABOLIC PANEL - Abnormal; Notable for the following components:      Result Value   Sodium 131 (*)    Chloride 96 (*)    Glucose, Bld 321 (*)    BUN 27 (*)    Creatinine, Ser 2.26 (*)    GFR, Estimated 33 (*)    All other components within normal limits  CBC - Abnormal; Notable for  the following components:    Hemoglobin 12.1 (*)    HCT 35.6 (*)    All other components within normal limits  RAPID URINE DRUG SCREEN, HOSP PERFORMED  TROPONIN I (HIGH SENSITIVITY)      EKG   EKG Interpretation  Date/Time:  Monday March 10 2023 06:15:19 EDT Ventricular Rate:  94 PR Interval:  150 QRS Duration: 86 QT Interval:  353 QTC Calculation: 442 R Axis:   83 Text Interpretation: Sinus rhythm Consider left ventricular hypertrophy Confirmed by Benedetto Ryder ([id number]) on 03/10/2023 7:36:49 AM         Imaging Studies ordered: I ordered imaging studies including chest x-ray, CT dissection study I independently visualized and interpreted imaging. I agree with the radiologist interpretation   Medicines ordered and prescription drug management: Meds ordered this encounter  Medications   lactated ringers bolus 1,000 mL    -I have reviewed the patients home medicines and have made adjustments as needed  Critical interventions none    Cardiac Monitoring: The patient was maintained on a cardiac monitor.  I personally viewed and interpreted the cardiac monitored which showed an underlying rhythm of: NSR, sinus tachycardia  Social Determinants of Health:  Factors impacting patients care include: Crack cocaine use   Reevaluation: After the interventions noted above, I reevaluated the patient and found that they have :improved  Co morbidities that complicate the patient evaluation  Past Medical History:  Diagnosis Date   Depression    Diabetes mellitus without complication (HCC)    History of migraine    Hyperlipidemia    Hypertension    Personal history of drug abuse (HCC)    Stroke (HCC) 2008      Dispostion: I considered admission for this patient, but at this time, patient does not meet inpatient criteria for admission and he is safe for discharge with outpatient cardiology follow-up and return precautions     Final Clinical Impression(s) / ED Diagnoses Final diagnoses:  None      @PCDICTATION @    Glendora Score, MD 03/10/23 1815

## 2023-03-13 NOTE — Progress Notes (Deleted)
Cardiology Office Note:    Date:  03/13/2023   ID:  Tyrell Antonio, DOB 1966/09/26, MRN [id number]  PCP:  Sharmon Revere, MD   [facility] Providers Cardiologist:  None { Click to update primary MD,subspecialty MD or APP then REFRESH:1}    Referring MD: Sharmon Revere, MD   CC: *** Consulted for the evaluation of *** at the behest of ***   History of Present Illness:    James Chang is a 57 y.o. male with a hx of HTN, DM with organ complications, polysubstance abuse (including cocaine), CKD stage IIIa, and schizoaffective disorder.  Patient notes that he is doing ***.   Since last visit notes *** . There are no*** interval hospital/ED visit.    No chest pain or pressure ***.  No SOB/DOE*** and no PND/Orthopnea***.  No weight gain or leg swelling***.  No palpitations or syncope ***.  Ambulatory blood pressure ***.   Past Medical History:  Diagnosis Date   Abdominal pain    CKD (chronic kidney disease), stage III (HCC)    Depression    Diabetes mellitus without complication (HCC)    History of migraine    Hyperlipidemia    Hypertension    Personal history of drug abuse (HCC)    Stroke (HCC) 11/11/2006    Past Surgical History:  Procedure Laterality Date   NO PAST SURGERIES      Current Medications: No outpatient medications have been marked as taking for the 03/14/23 encounter (Appointment) with Christell Constant, MD.     Allergies:   Metformin and related and Reglan [metoclopramide]   Social History   Socioeconomic History   Marital status: Single    Spouse name: Not on file   Number of children: 3   Years of education: Not on file   Highest education level: Not on file  Occupational History   Not on file  Tobacco Use   Smoking status: Never   Smokeless tobacco: Never  Vaping Use   Vaping Use: Never used  Substance and Sexual Activity   Alcohol use: Not Currently    Comment: seldom 1-2 times a month beer   Drug use: Yes     Types: Cocaine, "Crack" cocaine    Comment: "a couple days ago"   Sexual activity: Not on file  Other Topics Concern   Not on file  Social History Narrative   Disabled truck driver- since stroke.   Has 33 year old mother who is frail- he is now staying with her.   3 children- youngest is 16.   Single   Completed the 12th grade   Previous hx of crack cocaine abuse.  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         Social Determinants of Health   Financial Resource Strain: Not on file  Food Insecurity: Not on file  Transportation Needs: Not on file  Physical Activity: Not on file  Stress: Not on file  Social Connections: Not on file     Family History: The patient's ***family history includes Arthritis in his mother; Heart attack in his father; Hypertension in his father; Kidney disease in his father; Stroke in his father. There is no history of Cancer or Diabetes.  ROS:   Please see the history of present illness.    *** All other systems reviewed and are negative.  EKGs/Labs/Other Studies Reviewed:    The following studies were reviewed today: ***  EKG:  EKG is *** ordered today.  The ekg  ordered today demonstrates ***  Recent Labs: 04/06/2022: Magnesium 2.4 03/02/2023: ALT 15 03/10/2023: BUN 27; Creatinine, Ser 2.26; Hemoglobin 12.1; Platelets 284; Potassium 3.8; Sodium 131  Recent Lipid Panel    Component Value Date/Time   CHOL 221 (H) 03/29/2013 1111   TRIG 81 03/29/2013 1111   HDL 61 03/29/2013 1111   CHOLHDL 3.6 03/29/2013 1111   VLDL 16 03/29/2013 1111   LDLCALC 144 (H) 03/29/2013 1111   LDLDIRECT 148.9 02/03/2013 1155     Risk Assessment/Calculations:   {Does this patient have ATRIAL FIBRILLATION?:[phone]}  No BP recorded.  {Refresh Note OR Click here to enter BP  :1}***         Physical Exam:    VS:  There were no vitals taken for this visit.    Wt Readings from Last 3 Encounters:  03/10/23 179 lb (81.2 kg)  03/02/23 179 lb (81.2 kg)  10/04/22 174 lb (78.9  kg)     GEN: *** Well nourished, well developed in no acute distress HEENT: Normal NECK: No JVD; No carotid bruits LYMPHATICS: No lymphadenopathy CARDIAC: ***RRR, no murmurs, rubs, gallops RESPIRATORY:  Clear to auscultation without rales, wheezing or rhonchi  ABDOMEN: Soft, non-tender, non-distended MUSCULOSKELETAL:  No edema; No deformity  SKIN: Warm and dry NEUROLOGIC:  Alert and oriented x 3 PSYCHIATRIC:  Normal affect   ASSESSMENT:    No diagnosis found. PLAN:    Precordial Pain - The patient presents with cardiac/possibly cardiac/non-cardiac pain ***  Risk stratification - other key conditions include: *** The ASCVD Risk score (Arnett DK, et al., 2019) failed to calculate for the following reasons:   The patient has a prior MI or stroke diagnosis  - Additional Blood Work:  Lipids ***  - *** ASA 81 mg QD, current statin, and beta blocker therapies ***  - Sublingual nitroglycerin as need for chest pain. ***  Testing: - *** Criteria to defer EKG  stress include without evidence of accessory pathway, ventricular pacing, digoxin use, LBBB, or baseline ST changes.  - Would recommend an echocardiogram to assess LVEF and exclude WMA.   - Would recommend CCTA with possible FFR as needed to exclude obstructive CAD and to assess for non-obstructive CAD requiring secondary prevention - BMI in *** so high tube current may be necessary, *** no hx of AF ivabradine - resting heart rate is ***50-60 bpm, given BP room with ddd Metoprolol 25 mg PO 90 min prior to scan - resting heart rate is ***60-65 bpm, given BP room with add Metoprolol  50 mg PO 90 min prior to scan - resting heart rate is ***65-80 bpm, given BP room with add Metoprolol  100 mg PO 90 min prior to scan - - resting heart rate is ***> 80bpm, given BP room with  add ivabradine 15 mg PO 120 min prior to scan - GFR is *** necessitating contrast limit of ***  - Would recommend exercise/pharmacological ***nuclear medicine  stress test  - PET study - (NPO at midnight/hold beta blocker in AM); discussed risks, benefits, and alternatives of the diagnostic procedure including chest pain, arrhythmia, and death.  Patient amenable for testing.           {Are you ordering a CV Procedure (e.g. stress test, cath, DCCV, TEE, etc)?   Press F2        :[id number]}    Medication Adjustments/Labs and Tests Ordered: Current medicines are reviewed at length with the patient today.  Concerns regarding medicines are outlined above.  No  orders of the defined types were placed in this encounter.  No orders of the defined types were placed in this encounter.   There are no Patient Instructions on file for this visit.   Signed, Christell Constant, MD  03/13/2023 9:29 AM    [facility]

## 2023-03-14 ENCOUNTER — Ambulatory Visit: Payer: 59 | Attending: Internal Medicine | Admitting: Internal Medicine

## 2023-03-17 ENCOUNTER — Encounter: Payer: Self-pay | Admitting: Internal Medicine

## 2023-05-12 ENCOUNTER — Encounter: Payer: Self-pay | Admitting: Cardiovascular Disease

## 2023-05-12 ENCOUNTER — Ambulatory Visit: Payer: 59 | Attending: Cardiovascular Disease | Admitting: Cardiovascular Disease

## 2023-07-01 IMAGING — CT CT HEAD W/O CM
4 series · 16 of 47 positions shown, 18 images · non-contrast
Comparison: Prior head CT examinations 09/01/2021 and earlier.

CLINICAL DATA: Provided history: Syncope/presyncope,
cerebrovascular cause suspected.



[Series 2: head wo · axial · 0.47mm/px · z∈[+1406,+1516]mm · 7 of 30 slices shown, 9 images]
[im 4/30  brain]
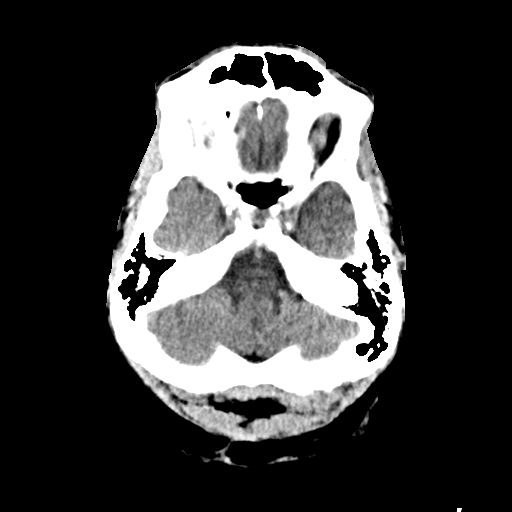
[im 4/30  bone]
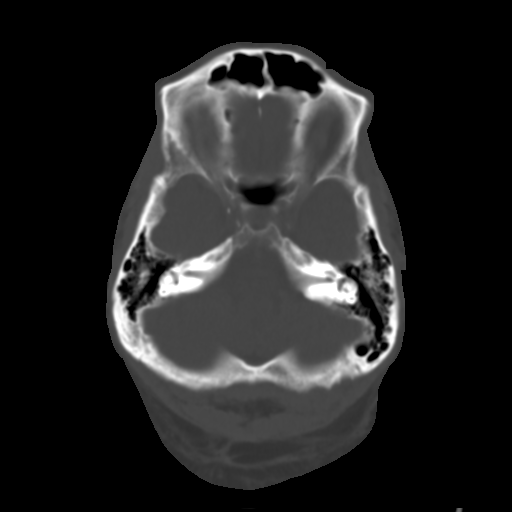
[im 8/30  brain]
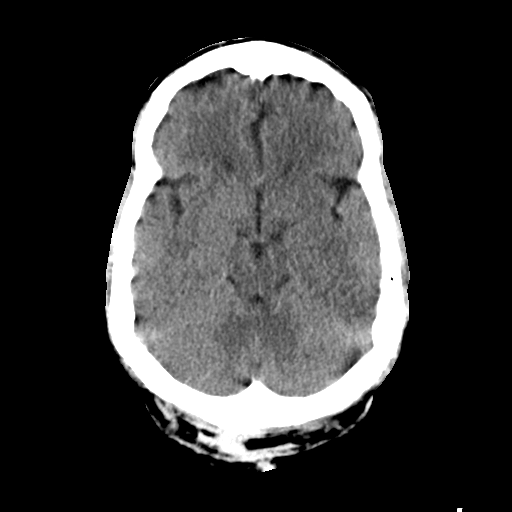
[im 11/30  brain]
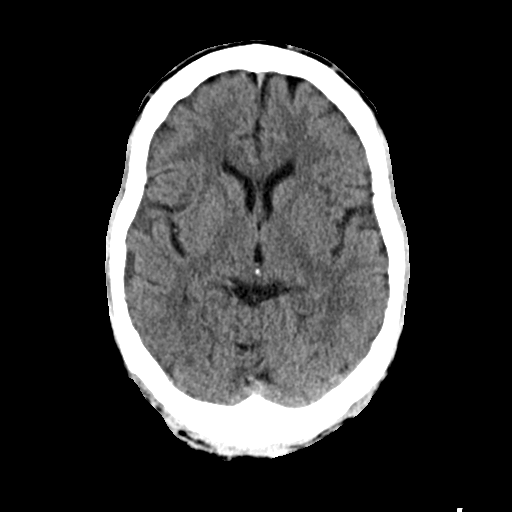
[im 15/30  brain]
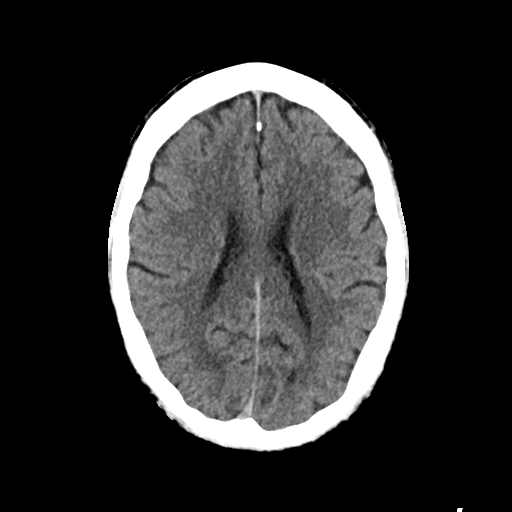
[im 19/30  brain]
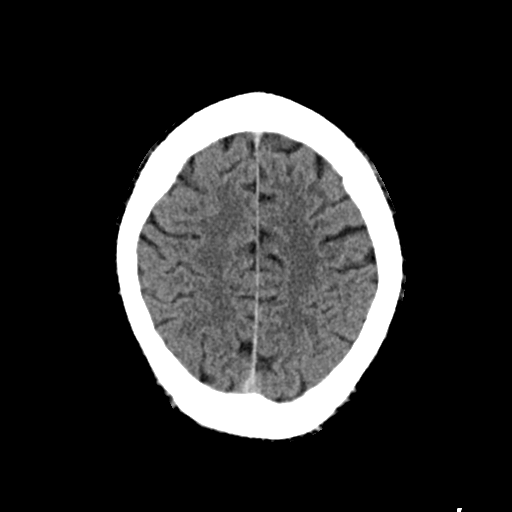
[im 19/30  bone]
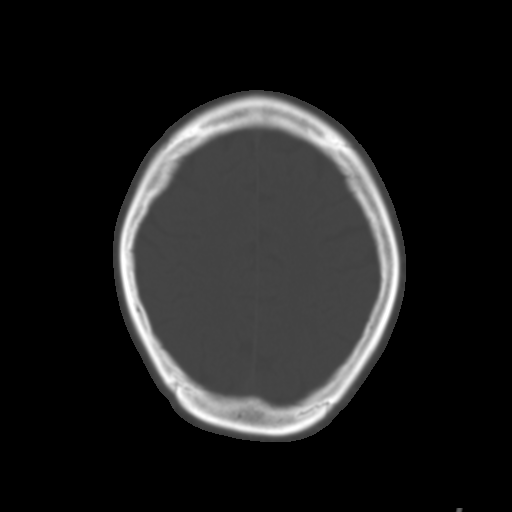
[im 22/30  brain]
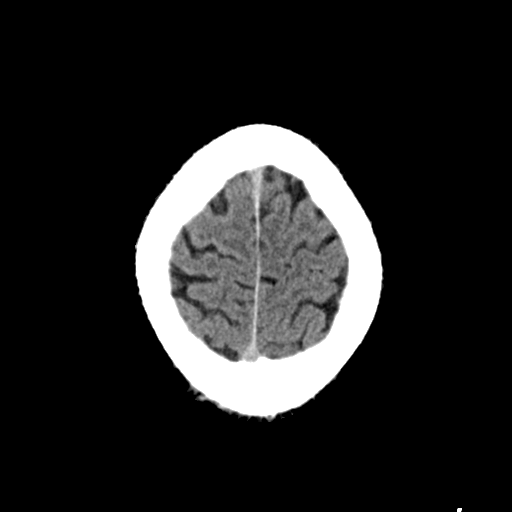
[im 26/30  brain]
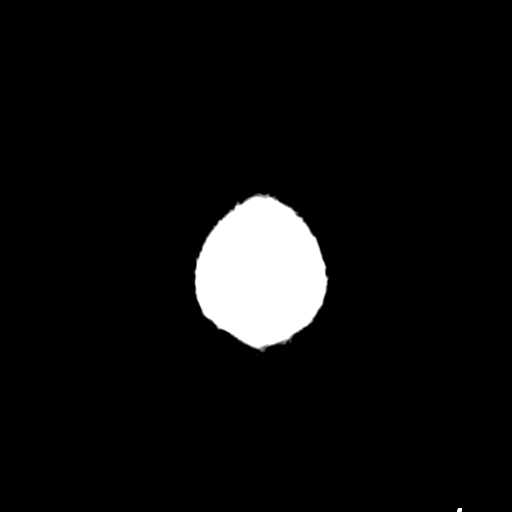

[Series 3: head bone · axial · 0.47mm/px · z∈[+1405,+1433]mm · 3 of 74 slices shown]
[im 8/74  bone]
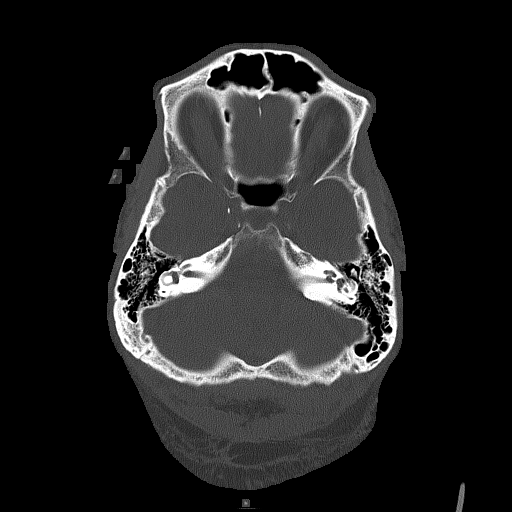
[im 15/74  bone]
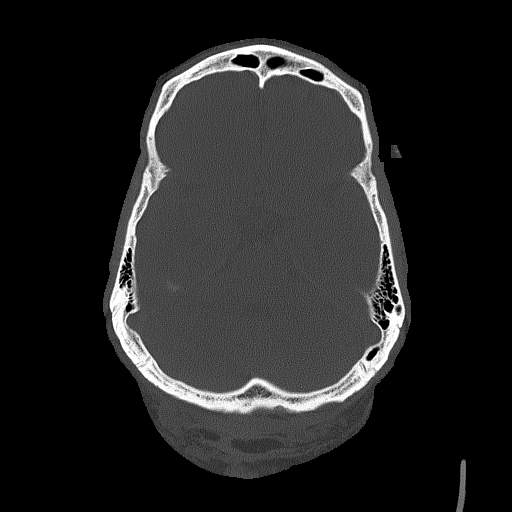
[im 22/74  bone]
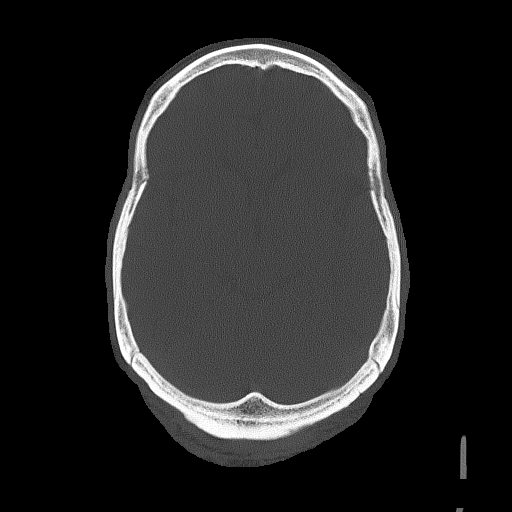

[Series 4: coronal soft tissue · coronal · 0.30mm/px · 3 of 74 slices shown]
[im 25/74  brain]
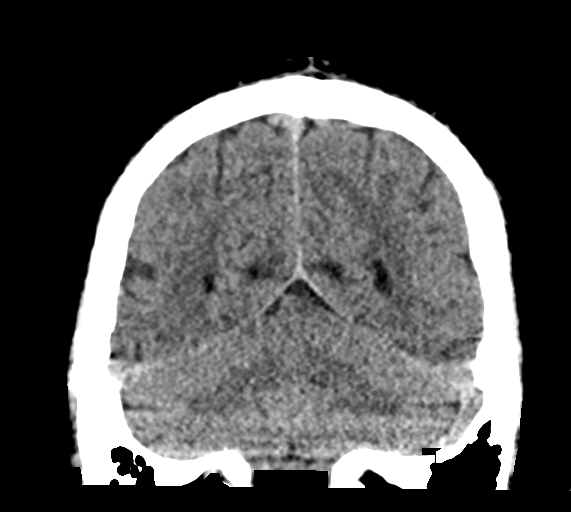
[im 33/74  brain]
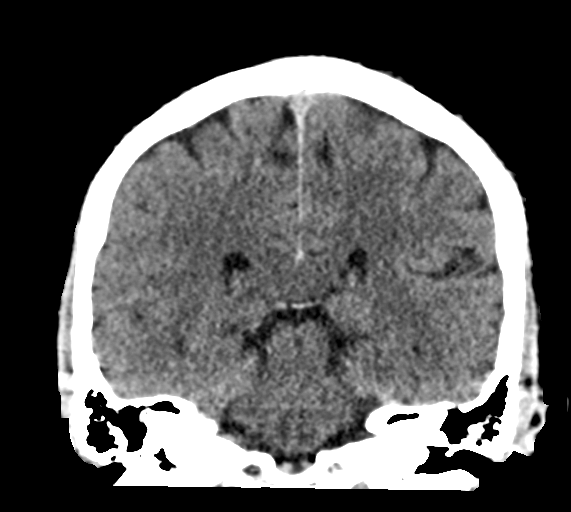
[im 41/74  brain]
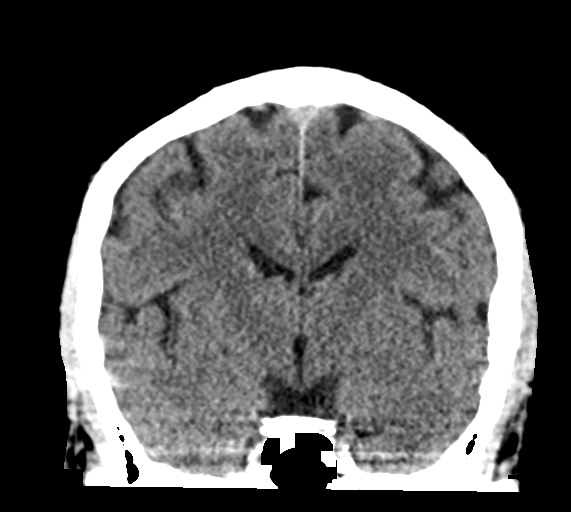

[Series 5: sagittal soft tissue · sagittal · 0.30mm/px · 3 of 57 slices shown]
[im 19/57  brain]
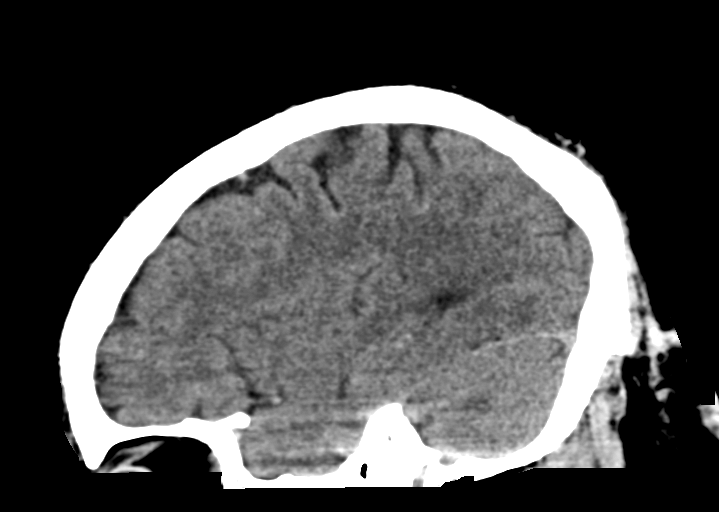
[im 29/57  brain]
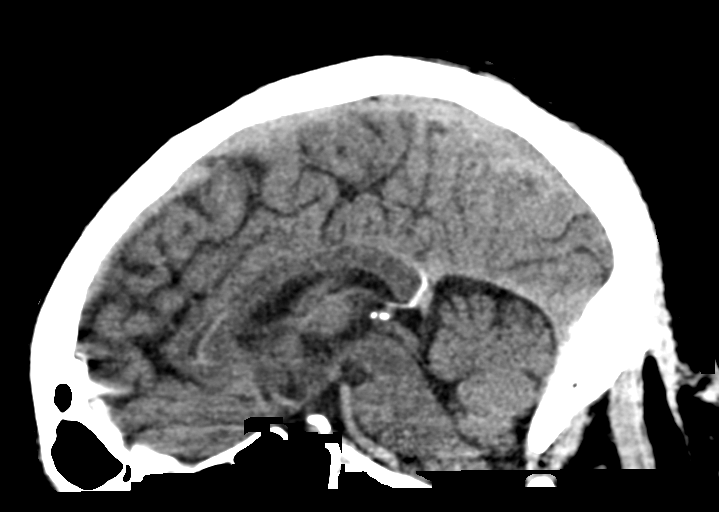
[im 38/57  brain]
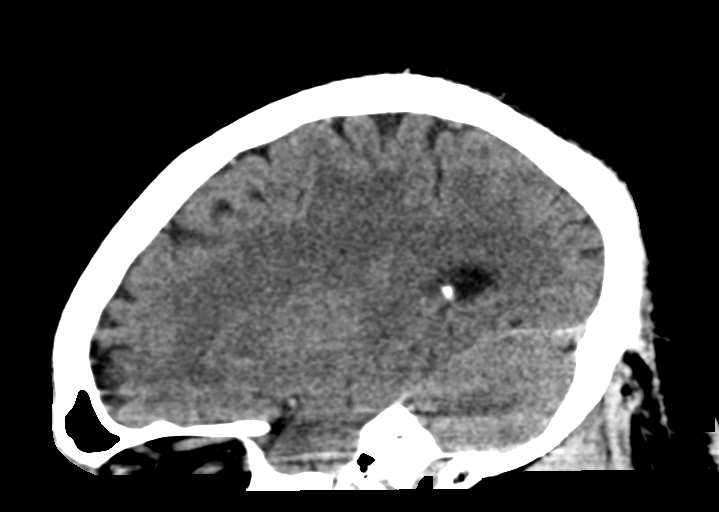

[16 of 47 positions shown; findings below may reference images not displayed]

FINDINGS: Brain:

No age advanced or lobar predominant parenchymal atrophy.

There is no acute intracranial hemorrhage.

No demarcated cortical infarct.

No extra-axial fluid collection.

No evidence of an intracranial mass.

No midline shift.

Vascular: No hyperdense vessel.  Atherosclerotic calcifications.

Skull: No fracture or aggressive osseous lesion.

Sinuses/Orbits: No mass or acute finding within the imaged orbits.
Mild mucosal thickening within the right sphenoid sinus at the
imaged levels.
IMPRESSION: No evidence of acute intracranial abnormality.

## 2023-07-02 IMAGING — DX DG ABDOMEN 1V
2 series · 2 of 2 positions shown · non-contrast
Comparison: CT abdomen pelvis from yesterday. Abdominal x-ray dated
January 22, 2022.

CLINICAL DATA: Nausea and vomiting.

EXAM:
ABDOMEN - 1 VIEW

[abdomen kub (1 of 2)]
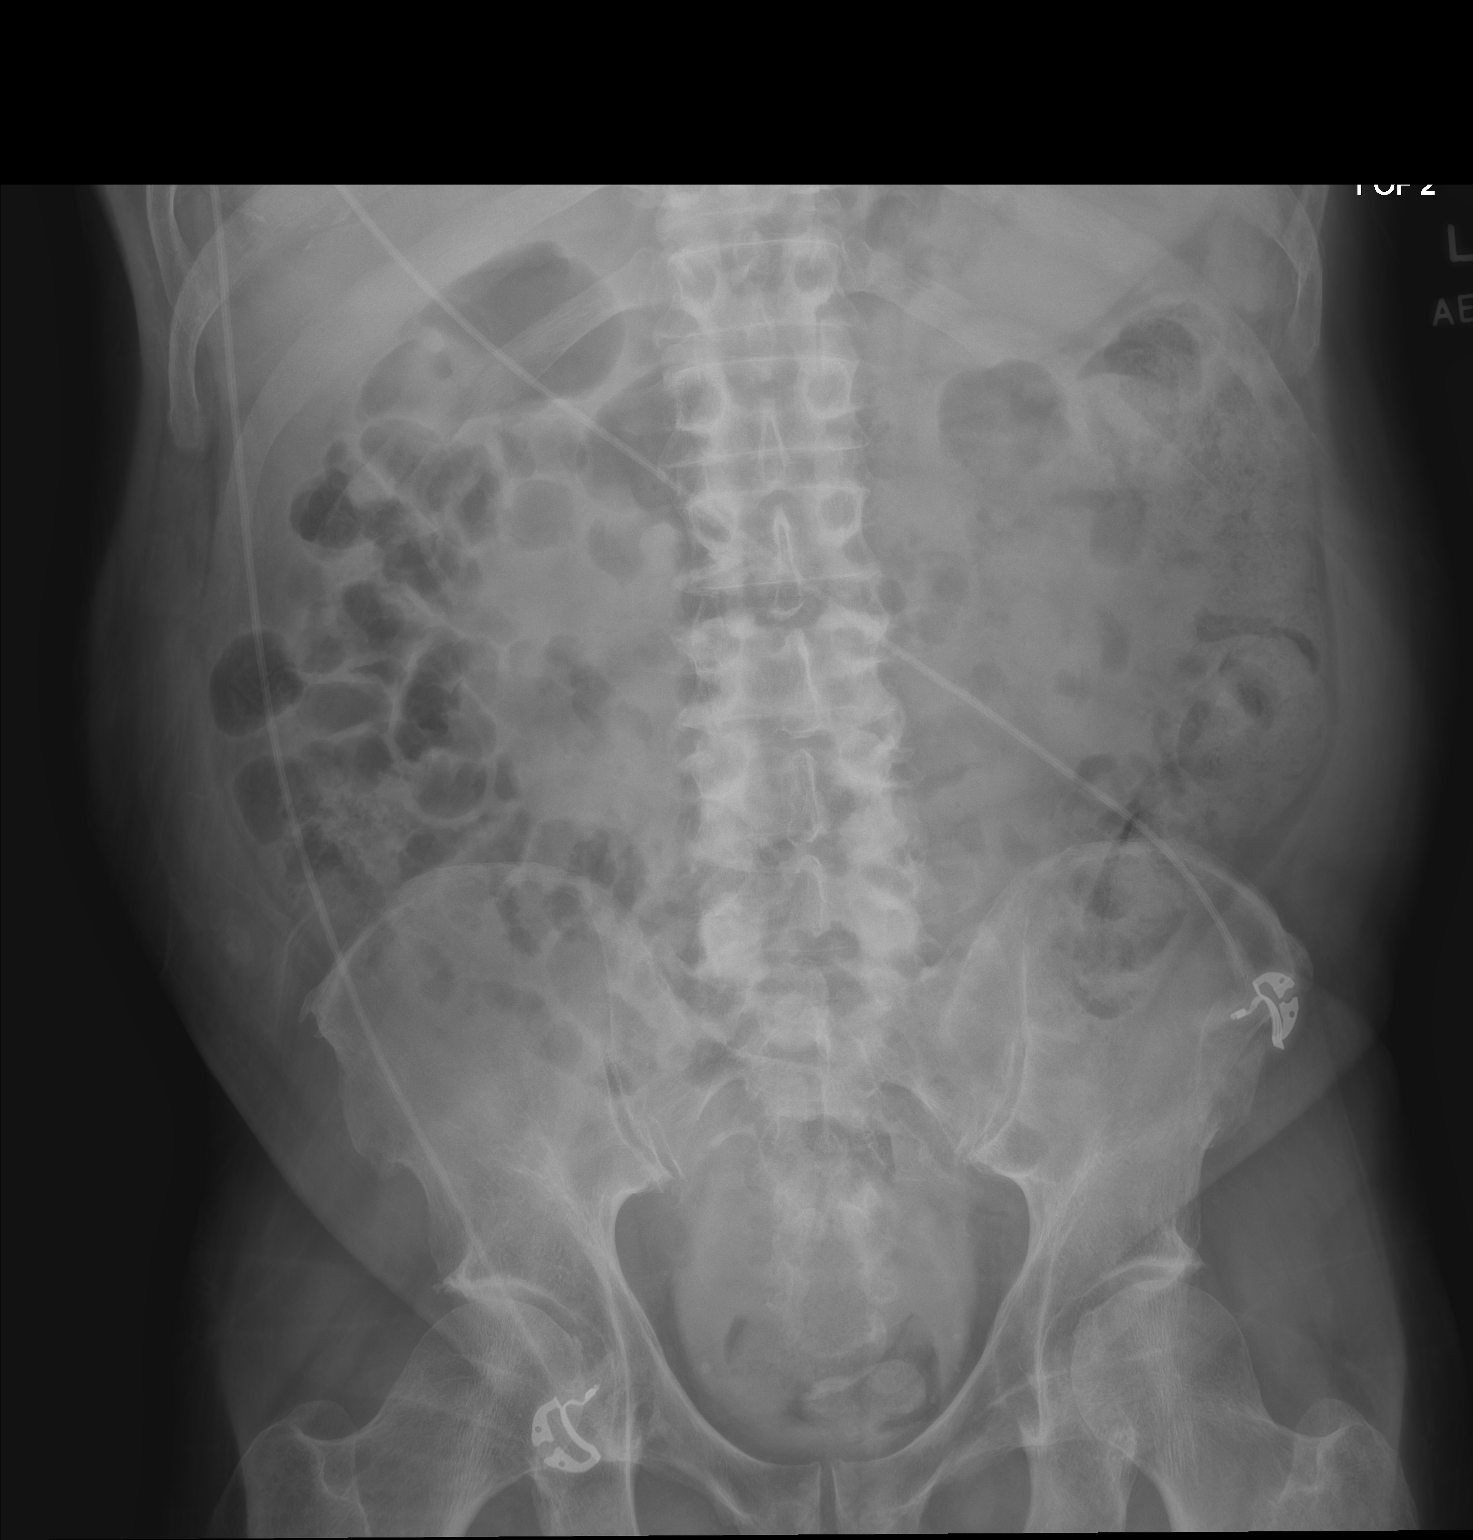

[abdomen kub (2 of 2)]
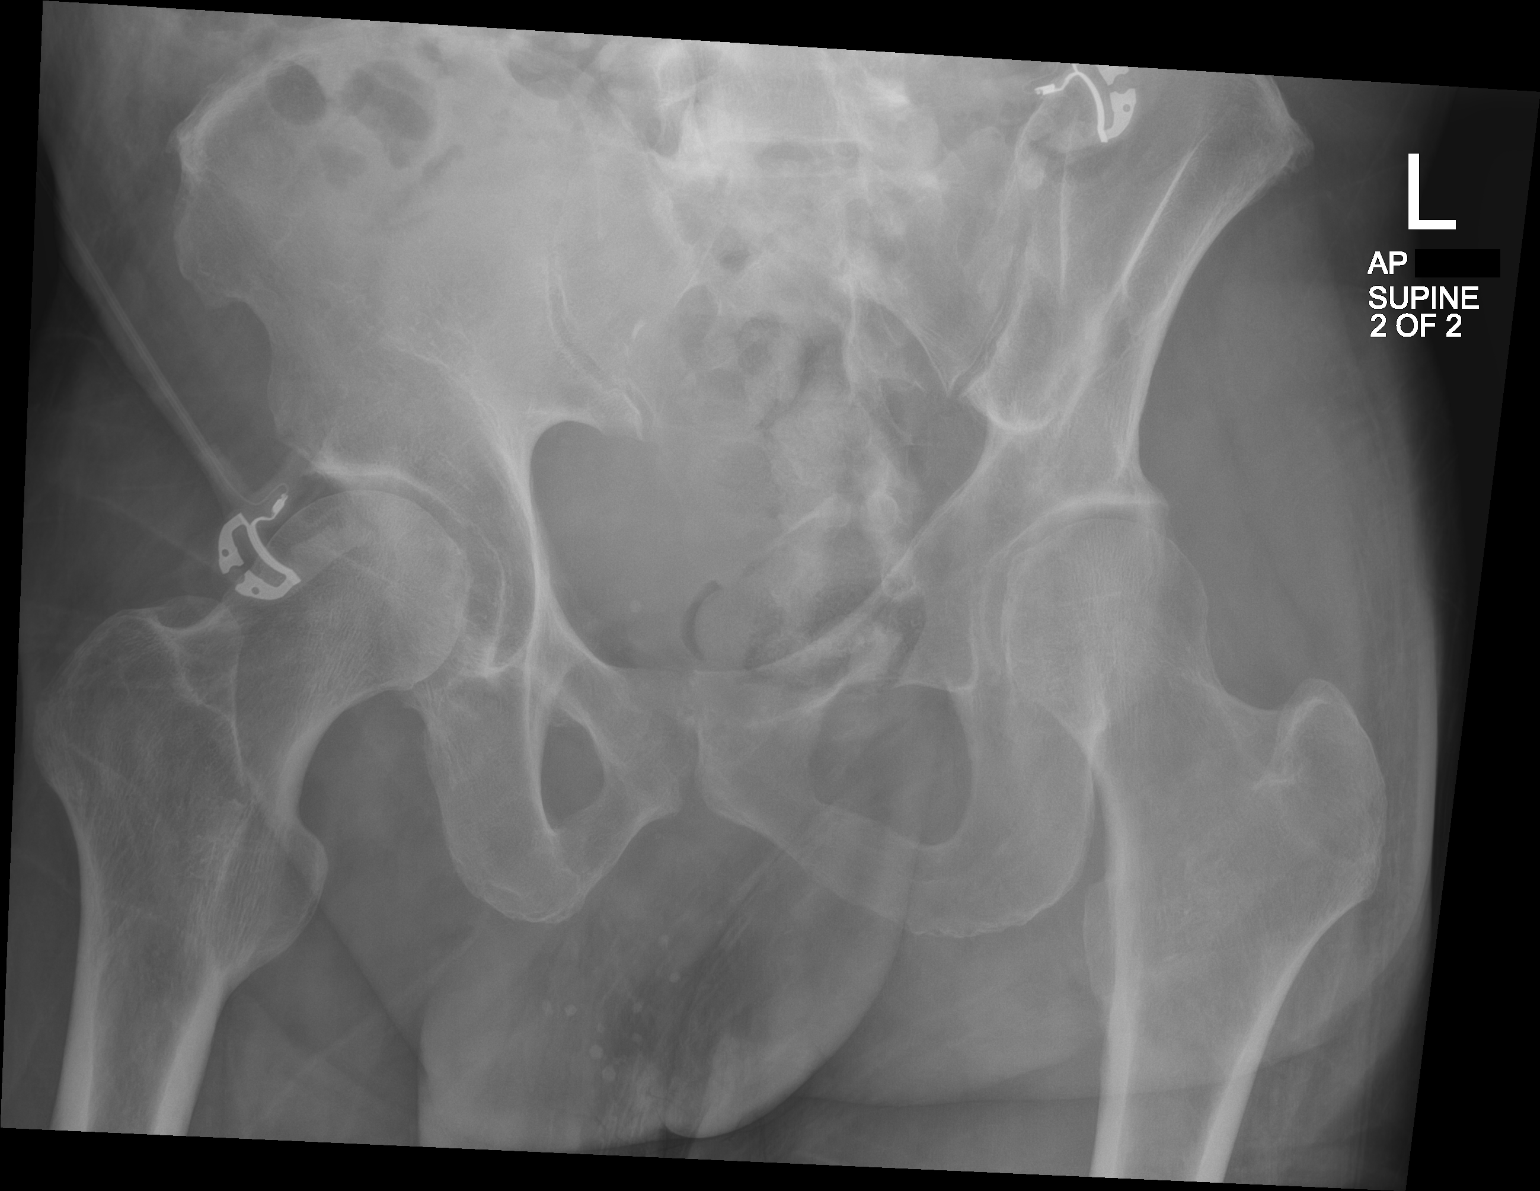

[2 of 2 positions shown; findings below may reference images not displayed]

FINDINGS: The bowel gas pattern is normal. No radio-opaque calculi or other
significant radiographic abnormality are seen.
IMPRESSION: Negative.

## 2023-10-07 ENCOUNTER — Emergency Department (HOSPITAL_COMMUNITY): Payer: 59

## 2023-10-07 ENCOUNTER — Emergency Department (HOSPITAL_COMMUNITY)
Admission: EM | Admit: 2023-10-07 | Discharge: 2023-10-08 | Disposition: A | Discharge status: Home / Self Care | Payer: 59 | Attending: Emergency Medicine | Admitting: Emergency Medicine

## 2023-10-07 DIAGNOSIS — M50322 Other cervical disc degeneration at C5-C6 level: Secondary | ICD-10-CM | POA: Diagnosis not present

## 2023-10-07 DIAGNOSIS — I129 Hypertensive chronic kidney disease with stage 1 through stage 4 chronic kidney disease, or unspecified chronic kidney disease: Secondary | ICD-10-CM | POA: Diagnosis not present

## 2023-10-07 DIAGNOSIS — F1290 Cannabis use, unspecified, uncomplicated: Secondary | ICD-10-CM | POA: Diagnosis not present

## 2023-10-07 DIAGNOSIS — R112 Nausea with vomiting, unspecified: Secondary | ICD-10-CM | POA: Insufficient documentation

## 2023-10-07 DIAGNOSIS — I6523 Occlusion and stenosis of bilateral carotid arteries: Secondary | ICD-10-CM | POA: Diagnosis not present

## 2023-10-07 DIAGNOSIS — Z794 Long term (current) use of insulin: Secondary | ICD-10-CM | POA: Diagnosis not present

## 2023-10-07 DIAGNOSIS — I6782 Cerebral ischemia: Secondary | ICD-10-CM | POA: Diagnosis not present

## 2023-10-07 DIAGNOSIS — I6522 Occlusion and stenosis of left carotid artery: Secondary | ICD-10-CM | POA: Insufficient documentation

## 2023-10-07 DIAGNOSIS — M4802 Spinal stenosis, cervical region: Secondary | ICD-10-CM | POA: Diagnosis not present

## 2023-10-07 DIAGNOSIS — R1013 Epigastric pain: Secondary | ICD-10-CM | POA: Insufficient documentation

## 2023-10-07 DIAGNOSIS — R531 Weakness: Secondary | ICD-10-CM | POA: Insufficient documentation

## 2023-10-07 DIAGNOSIS — N1831 Chronic kidney disease, stage 3a: Secondary | ICD-10-CM | POA: Insufficient documentation

## 2023-10-07 DIAGNOSIS — E785 Hyperlipidemia, unspecified: Secondary | ICD-10-CM | POA: Diagnosis not present

## 2023-10-07 DIAGNOSIS — R1010 Upper abdominal pain, unspecified: Secondary | ICD-10-CM

## 2023-10-07 DIAGNOSIS — R253 Fasciculation: Secondary | ICD-10-CM | POA: Diagnosis not present

## 2023-10-07 DIAGNOSIS — R29810 Facial weakness: Secondary | ICD-10-CM | POA: Diagnosis not present

## 2023-10-07 DIAGNOSIS — R4182 Altered mental status, unspecified: Secondary | ICD-10-CM | POA: Insufficient documentation

## 2023-10-07 DIAGNOSIS — E1122 Type 2 diabetes mellitus with diabetic chronic kidney disease: Secondary | ICD-10-CM | POA: Diagnosis not present

## 2023-10-07 DIAGNOSIS — Z79899 Other long term (current) drug therapy: Secondary | ICD-10-CM | POA: Diagnosis not present

## 2023-10-07 DIAGNOSIS — I10 Essential (primary) hypertension: Secondary | ICD-10-CM

## 2023-10-07 DIAGNOSIS — F1410 Cocaine abuse, uncomplicated: Secondary | ICD-10-CM | POA: Diagnosis not present

## 2023-10-07 LAB — DIFFERENTIAL
Abs Immature Granulocytes: 0.05 10*3/uL (ref 0.00–0.07)
Basophils Absolute: 0 10*3/uL (ref 0.0–0.1)
Basophils Relative: 0 %
Eosinophils Absolute: 0 10*3/uL (ref 0.0–0.5)
Eosinophils Relative: 0 %
Immature Granulocytes: 1 %
Lymphocytes Relative: 8 %
Lymphs Abs: 0.8 10*3/uL (ref 0.7–4.0)
Monocytes Absolute: 0.4 10*3/uL (ref 0.1–1.0)
Monocytes Relative: 3 %
Neutro Abs: 9.7 10*3/uL — ABNORMAL HIGH (ref 1.7–7.7)
Neutrophils Relative %: 88 %

## 2023-10-07 LAB — RAPID URINE DRUG SCREEN, HOSP PERFORMED
Amphetamines: NOT DETECTED
Barbiturates: NOT DETECTED
Benzodiazepines: NOT DETECTED
Cocaine: POSITIVE — AB
Opiates: NOT DETECTED
Tetrahydrocannabinol: POSITIVE — AB

## 2023-10-07 LAB — COMPREHENSIVE METABOLIC PANEL
ALT: 16 U/L (ref 0–44)
AST: 16 U/L (ref 15–41)
Albumin: 3.4 g/dL — ABNORMAL LOW (ref 3.5–5.0)
Alkaline Phosphatase: 73 U/L (ref 38–126)
Anion gap: 9 (ref 5–15)
BUN: 17 mg/dL (ref 6–20)
CO2: 24 mmol/L (ref 22–32)
Calcium: 9.2 mg/dL (ref 8.9–10.3)
Chloride: 103 mmol/L (ref 98–111)
Creatinine, Ser: 1.57 mg/dL — ABNORMAL HIGH (ref 0.61–1.24)
GFR, Estimated: 51 mL/min — ABNORMAL LOW (ref >60)
Glucose, Bld: 217 mg/dL — ABNORMAL HIGH (ref 70–99)
Potassium: 3.8 mmol/L (ref 3.5–5.1)
Sodium: 136 mmol/L (ref 135–145)
Total Bilirubin: 1.3 mg/dL — ABNORMAL HIGH (ref <1.2)
Total Protein: 6.8 g/dL (ref 6.5–8.1)

## 2023-10-07 LAB — I-STAT CHEM 8, ED
BUN: 18 mg/dL (ref 6–20)
Calcium, Ion: 1.09 mmol/L — ABNORMAL LOW (ref 1.15–1.40)
Chloride: 104 mmol/L (ref 98–111)
Creatinine, Ser: 1.5 mg/dL — ABNORMAL HIGH (ref 0.61–1.24)
Glucose, Bld: 212 mg/dL — ABNORMAL HIGH (ref 70–99)
HCT: 36 % — ABNORMAL LOW (ref 39.0–52.0)
Hemoglobin: 12.2 g/dL — ABNORMAL LOW (ref 13.0–17.0)
Potassium: 3.8 mmol/L (ref 3.5–5.1)
Sodium: 137 mmol/L (ref 135–145)
TCO2: 22 mmol/L (ref 22–32)

## 2023-10-07 LAB — URINALYSIS, ROUTINE W REFLEX MICROSCOPIC
Bilirubin Urine: NEGATIVE
Glucose, UA: 150 mg/dL — AB
Ketones, ur: 5 mg/dL — AB
Leukocytes,Ua: NEGATIVE
Nitrite: NEGATIVE
Protein, ur: >=300 mg/dL — AB
Specific Gravity, Urine: 1.024 (ref 1.005–1.030)
pH: 7 (ref 5.0–8.0)

## 2023-10-07 LAB — PROTIME-INR
INR: 1 (ref 0.8–1.2)
Prothrombin Time: 13.1 s (ref 11.4–15.2)

## 2023-10-07 LAB — CBC
HCT: 36.2 % — ABNORMAL LOW (ref 39.0–52.0)
Hemoglobin: 12.2 g/dL — ABNORMAL LOW (ref 13.0–17.0)
MCH: 27.4 pg (ref 26.0–34.0)
MCHC: 33.7 g/dL (ref 30.0–36.0)
MCV: 81.2 fL (ref 80.0–100.0)
Platelets: 280 10*3/uL (ref 150–400)
RBC: 4.46 MIL/uL (ref 4.22–5.81)
RDW: 13.3 % (ref 11.5–15.5)
WBC: 11 10*3/uL — ABNORMAL HIGH (ref 4.0–10.5)
nRBC: 0 % (ref 0.0–0.2)

## 2023-10-07 LAB — TROPONIN I (HIGH SENSITIVITY)
Troponin I (High Sensitivity): 13 ng/L (ref <18)
Troponin I (High Sensitivity): 12 ng/L (ref <18)

## 2023-10-07 LAB — APTT: aPTT: 25 s (ref 24–36)

## 2023-10-07 LAB — LIPASE, BLOOD: Lipase: 22 U/L (ref 11–51)

## 2023-10-07 LAB — ETHANOL: Alcohol, Ethyl (B): <10 mg/dL (ref <10)

## 2023-10-07 MED ORDER — ONDANSETRON HCL 4 MG/2ML IJ SOLN
4.0000 mg | Freq: Once | INTRAMUSCULAR | Status: AC
  Administered 2023-10-07: 4 mg via INTRAVENOUS
  Filled 2023-10-07: qty 2

## 2023-10-07 MED ORDER — IOHEXOL 350 MG/ML SOLN
75.0000 mL | Freq: Once | INTRAVENOUS | Status: AC | PRN
  Administered 2023-10-07: 75 mL via INTRAVENOUS

## 2023-10-07 MED ORDER — ONDANSETRON 8 MG PO TBDP: 8.0000 mg | ORAL_TABLET | Freq: Three times a day (TID) | ORAL | 0 refills | Status: DC | PRN

## 2023-10-07 MED ORDER — PANTOPRAZOLE SODIUM 40 MG PO TBEC: 40.0000 mg | DELAYED_RELEASE_TABLET | Freq: Every day | ORAL | 0 refills | Status: DC

## 2023-10-07 MED ORDER — DROPERIDOL 2.5 MG/ML IJ SOLN
1.2500 mg | Freq: Once | INTRAMUSCULAR | Status: AC
  Administered 2023-10-07: 1.25 mg via INTRAVENOUS
  Filled 2023-10-07: qty 2

## 2023-10-07 MED ORDER — PANTOPRAZOLE SODIUM 40 MG IV SOLR
80.0000 mg | Freq: Once | INTRAVENOUS | Status: AC
  Administered 2023-10-07: 80 mg via INTRAVENOUS
  Filled 2023-10-07: qty 20

## 2023-10-07 MED ORDER — LABETALOL HCL 5 MG/ML IV SOLN
20.0000 mg | Freq: Once | INTRAVENOUS | Status: AC
  Administered 2023-10-07: 20 mg via INTRAVENOUS
  Filled 2023-10-07: qty 4

## 2023-10-07 MED ORDER — DIAZEPAM 5 MG/ML IJ SOLN
2.5000 mg | Freq: Once | INTRAMUSCULAR | Status: AC
  Administered 2023-10-07: 2.5 mg via INTRAVENOUS
  Filled 2023-10-07: qty 2

## 2023-10-07 MED ORDER — MORPHINE SULFATE (PF) 4 MG/ML IV SOLN
4.0000 mg | Freq: Once | INTRAVENOUS | Status: AC
  Administered 2023-10-07: 4 mg via INTRAVENOUS
  Filled 2023-10-07: qty 1

## 2023-10-07 NOTE — ED Triage Notes (Signed)
Pt bib ems with abdominal pain X2 days along with nausea and vomiting. En route pt had a syncopal event. Started to become more hypertensive, with decreased LOC. On arrival to ED, pt with R sided facial droop and unequal pupils. Code stroke activated.  BP 212/120

## 2023-10-07 NOTE — ED Provider Notes (Signed)
Pt presented to the ED for AP, activated as a code stroke due to syncope, possible neuro deficits.  Care assumed pending MRI, re-eval.   MRI is negative for acute abnormality.  Patient's blood pressure did improve after medications.  He is able to tolerate p.o.  Will discharge home on antiemetic, PPI.  Discussed avoiding cocaine, PCP follow-up and return precautions.  Presentation is not consistent with hypertensive emergency.   Tilden Fossa, MD 10/08/23 [phone]

## 2023-10-07 NOTE — ED Notes (Signed)
Confirmed pt is admitted to [facility]

## 2023-10-07 NOTE — Discharge Instructions (Addendum)
It was our pleasure to provide your ER care today - we hope that you feel better.  Drink plenty of fluids/stay well hydrated.   Take acetaminophen as need. Take protonix (acid blocker). Take zofran as need for nausea.   Your blood pressure is high today - take your meds as prescribed, limit salt intake, and follow up closely with primary care doctor in 1-2 weeks.   Note that increasingly we are seeing a recurrent abdominal pain and/or vomiting syndrome called Cannabinoid Hyperemesis Syndrome - see attached info - in these cases avoiding marijuana use will prevent symptoms from recurring (note that symptoms can persist for a few weeks if history of heavy marijuana use as it can take time to get out of system).   Avoid cocaine use as it is harmful to your physical health and mental well-being - see attached info as well as substance use treatment programs in the area.   Return to ER right away  if worse, fevers, new symptoms, new or worsening or severe abdominal pain, persistent vomiting, chest pain, trouble breathing, or other emergency concern.  You were given pain meds in the ER - no driving for the next 6 hours.

## 2023-10-07 NOTE — Consult Note (Signed)
NEUROLOGY CONSULT NOTE   Date of service: October 07, 2023 Patient Name: James Chang MRN:  [id number] DOB:  1966/05/17 Chief Complaint: Code Stroke History is obtained from: patient and past medical records  History of Present Illness  James Chang is a 57 y.o. male  has a past medical history of Abdominal pain, CKD (chronic kidney disease), stage III (HCC), Depression, Diabetes mellitus without complication (HCC), History of migraine, Hyperlipidemia, Hypertension, Personal history of drug abuse (HCC), and Stroke (HCC) (11/11/2006). Cocaine abuse, who presents with abdominal pain and unresponsiveness.  EMS was called for abdominal pain nausea and vomiting of 2 days duration, en route to the hospital James Chang had an episode of decreased responsiveness, weakness, and hypertension. Code stroke was activated on EMS arrival.  On exam he does have some right mouth twitching, generalized weakness, and altered mental status. Reports GI upset started 2 days ago. He received 4 mg of zofran by EMS.  Continues to have dry heaving. BP 223/104. Having severe nausea with dry heaving during CT scan, he was able to sit up independently. After CT BP elevated and reporting headache, labetalol and morphine ordered x1 dose.   LKW: Two days ago Modified rankin score: 0-Completely asymptomatic and back to baseline post- stroke IV Thrombolysis: No, outside of window EVT: No, no LVO   1a Level of Conscious.: 1 1b LOC Questions: 1 1c LOC Commands: 0 2 Best Gaze: 0 3 Visual: 0 4 Facial Palsy: 0 5a Motor Arm - left: 1 5b Motor Arm - Right: 1 6a Motor Leg - Left: 1 6b Motor Leg - Right: 0 7 Limb Ataxia: 0 8 Sensory: 0 9 Best Language: 0 10 Dysarthria: 1 11 Extinct. and Inatten.: 0 TOTAL: 6    ROS  Comprehensive ROS performed and pertinent positives documented in HPI    Past History   Past Medical History:  Diagnosis Date   Abdominal pain    CKD (chronic kidney disease), stage III (HCC)     Depression    Diabetes mellitus without complication (HCC)    History of migraine    Hyperlipidemia    Hypertension    Personal history of drug abuse (HCC)    Stroke (HCC) 11/11/2006    Past Surgical History:  Procedure Laterality Date   NO PAST SURGERIES      Family History: Family History  Problem Relation Age of Onset   Arthritis Mother    Stroke Father    Hypertension Father    Kidney disease Father    Heart attack Father    Cancer Neg Hx    Diabetes Neg Hx     Social History  reports that he has never smoked. He has never used smokeless tobacco. He reports that he does not currently use alcohol. He reports current drug use. Drugs: Cocaine and "Crack" cocaine.  Allergies  Allergen Reactions   Metformin And Related Diarrhea   Reglan [Metoclopramide] Diarrhea    Medications  No current facility-administered medications for this encounter.  Current Outpatient Medications:    ABILIFY MAINTENA 400 MG PRSY prefilled syringe, Inject 1 each into the muscle every 30 (thirty) days., Disp: , Rfl:    alum & mag hydroxide-simeth (MAALOX PLUS) 400-400-40 MG/5ML suspension, Take 15 mLs by mouth every 6 (six) hours as needed for indigestion., Disp: 355 mL, Rfl: 0   atorvastatin (LIPITOR) 40 MG tablet, Take 40 mg by mouth daily., Disp: , Rfl:    busPIRone (BUSPAR) 15 MG tablet, Take 15 mg by  mouth 2 (two) times daily., Disp: , Rfl:    diclofenac Sodium (VOLTAREN) 1 % GEL, Apply 1 application. topically daily as needed (For pain)., Disp: , Rfl:    dicyclomine (BENTYL) 20 MG tablet, Take 1 tablet (20 mg total) by mouth 2 (two) times daily., Disp: 20 tablet, Rfl: 0   DULoxetine (CYMBALTA) 20 MG capsule, Take 20 mg by mouth daily., Disp: , Rfl:    famotidine (PEPCID) 20 MG tablet, Take 1 tablet (20 mg total) by mouth 2 (two) times daily., Disp: 30 tablet, Rfl: 0   gabapentin (NEURONTIN) 300 MG capsule, Take 300 mg by mouth daily as needed (For back pain)., Disp: , Rfl:    insulin  glargine (LANTUS SOLOSTAR) 100 UNIT/ML Solostar Pen, Inject 40 Units into the skin daily., Disp: , Rfl:    JARDIANCE 25 MG TABS tablet, Take 25 mg by mouth daily., Disp: , Rfl:    LINZESS 145 MCG CAPS capsule, Take 145 mcg by mouth daily., Disp: , Rfl:    losartan (COZAAR) 100 MG tablet, Take 100 mg by mouth daily., Disp: , Rfl:    ondansetron (ZOFRAN) 4 MG tablet, Take 4 mg by mouth every 8 (eight) hours as needed for nausea/vomiting., Disp: , Rfl:    ondansetron (ZOFRAN-ODT) 4 MG disintegrating tablet, Take 1 tablet (4 mg total) by mouth every 8 (eight) hours as needed for nausea or vomiting., Disp: 20 tablet, Rfl: 0   pantoprazole (PROTONIX) 40 MG tablet, Take 40 mg by mouth daily., Disp: , Rfl:    prochlorperazine (COMPAZINE) 10 MG tablet, Take 1 tablet (10 mg total) by mouth 2 (two) times daily as needed for nausea or vomiting., Disp: 10 tablet, Rfl: 0   promethazine (PHENERGAN) 25 MG suppository, Place 1 suppository (25 mg total) rectally every 6 (six) hours as needed for nausea., Disp: 12 each, Rfl: 0   sucralfate (CARAFATE) 1 g tablet, Take 1 tablet (1 g total) by mouth 4 (four) times daily -  with meals and at bedtime., Disp: 60 tablet, Rfl: 0   tiZANidine (ZANAFLEX) 4 MG tablet, Take 4 mg by mouth 3 (three) times daily., Disp: , Rfl:   Vitals   Vitals:   10/07/23 1700 10/07/23 1730 10/07/23 1745 10/07/23 1800  BP:  (!) 202/123 (!) 223/104 (!) 222/190  Pulse:   92 100  Resp:   17 (!) 33  Temp:   (!) 97.2 F (36.2 C)   TempSrc:   Temporal   SpO2:   96% 100%  Weight: 84.8 kg       There is no height or weight on file to calculate BMI.  Physical Exam   Constitutional: Appears well-developed and well-nourished.  Psych: Affect appropriate to situation.  Eyes: No scleral injection.  HENT: No OP obstruction.  Head: Normocephalic.  Cardiovascular: Normal rate and regular rhythm.  Respiratory: Effort normal, non-labored breathing.  GI: Soft.  No distension. There is no tenderness.   Skin: WDI.   Neurologic Examination   Neuro: Mental Status: Patient is lethargic on initial exam, requires stimulation  States name,  Cranial Nerves: II: Visual Fields are full. Pupils are unequal-left pupil is 3 mm reactive, right pupil is 1.5 mm reactive.. III,IV, VI: EOMI without ptosis or diploplia.  V: Facial sensation is symmetric to temperature VII: Facial movement is symmetric resting and smiling.  Very subtle twitching of right face noted VIII: Hearing is intact to voice X: Palate elevates symmetrically XI: Shoulder shrug is symmetric. XII: Tongue protrudes midline without atrophy  or fasciculations.  Motor: Tone is normal. Bulk is normal.  Generalized weakness in bilateral upper extremities Right leg maintains antigravity strength, left leg drifts  Sensory: Sensation is symmetric to light touch and temperature in the arms and legs.  Cerebellar: No ataxia noted on exam   Labs/Imaging/Neurodiagnostic studies   CBC:  Recent Labs  Lab Oct 31, 2023 1724  HGB 12.2*  HCT 36.0*    Basic Metabolic Panel:  Lab Results  Component Value Date   NA 137 10-31-2023   K 3.8 10/31/2023   CO2 27 03/10/2023   GLUCOSE 212 (H) 2023/10/31   BUN 18 2023-10-31   CREATININE 1.50 (H) 2023/10/31   CALCIUM 9.1 03/10/2023   GFRNONAA 33 (L) 03/10/2023   GFRAA >60 03/23/2020    Lipid Panel:  Lab Results  Component Value Date   LDLCALC 144 (H) 03/29/2013    HgbA1c:  Lab Results  Component Value Date   HGBA1C 9.7 (H) 04/04/2022    Urine Drug Screen: From 2023 below.  From today pending.    Component Value Date/Time   LABOPIA NONE DETECTED 04/04/2022 1138   COCAINSCRNUR POSITIVE (A) 04/04/2022 1138   LABBENZ NONE DETECTED 04/04/2022 1138   AMPHETMU NONE DETECTED 04/04/2022 1138   THCU NONE DETECTED 04/04/2022 1138   LABBARB NONE DETECTED 04/04/2022 1138     Alcohol Level     Component Value Date/Time   ETH <10 04/04/2022 1032    Code Stroke CT Head without  contrast(Personally reviewed): Negative head CT. ASPECTS of 10.   CT angio Head and Neck with contrast w/ perfusion (Personally reviewed): Mild atherosclerosis in the head and neck without a large vessel occlusion or significant proximal stenosis.  MRI Brain(Personally reviewed): Pending  Neurodiagnostics rEEG:    ASSESSMENT   James Chang is a 57 y.o. male  has a past medical history of Abdominal pain, CKD (chronic kidney disease), stage III (HCC), Depression, Diabetes mellitus without complication (HCC), History of migraine, Hyperlipidemia, Hypertension, Personal history of drug abuse (HCC), and Stroke (HCC) (11/11/2006).  Cocaine abuse, prior stroke in 2008 with mild residual left-sided weakness-who presents with abdominal pain and unresponsiveness. EMS was called for abdominal pain, en route to the hospital James Chang had an episode of decreased responsiveness, weakness, and hypertension. On exam he does have some very subtle right mouth twitching, generalized weakness, and altered mental status. Reports GI upset started 2 days ago. He received 4 mg of zofran by EMS. BP 223/104. Having severe nausea with dry heaving during CT scan, he was able to sit up independently. After CT BP elevated and reporting headache, labetalol and morphine ordered x1 dose.   Differentials include toxic encephalopathy due to acute GI illness, given history of drug abuse, stroke cannot be ruled out.  Drug intoxication should also be in the differentials.  Given history of upper epigastric pain and chest pain, check troponins to rule out MI again in the setting of history of cocaine use.  RECOMMENDATIONS  - The following imaging   - MRI of the brain without contrast *if MRI is positive he will need admission for a full stroke work up - Troponin is pending - GI work up per EDP ______________________________________________________________   Patient seen and examined by NP/APP with MD. MD to update note  as needed.   Elmer Picker, DNP, FNP-BC [facility] Pager: [phone]   Attending Neurohospitalist Addendum Patient seen and examined with APP/Resident. Agree with the history and physical as documented above. Agree with the plan as  documented, which I helped formulate. I have independently reviewed the chart, obtained history, review of systems and examined the patient.I have personally reviewed pertinent head/neck/spine imaging (CT/MRI). Plan discussed with Dr. Denton Lank Please feel free to call with any questions.  -- Milon Dikes, MD Neurologist [facility] Pager: [phone]

## 2023-10-07 NOTE — ED Provider Notes (Addendum)
[place] EMERGENCY DEPARTMENT AT [facility] Provider Note   CSN: [id number] Arrival date & time: 10/07/23  1724  An emergency department physician performed an initial assessment on this suspected stroke patient at 1717.  History  Chief Complaint  Patient presents with   Code Stroke    James Chang is a 57 y.o. male.  Pt c/o epigastric pain and nausea/vomiting for past  two days. Epigastric pain constant, dull, non radiating. Emesis clear, not bloody or bilious. Having bms. No abd distension. No dysuria or gu c/o. No back or flank pain. No chest pain or sob. On way to hospital via ems became less responsive verbally, eyes closed, and code stroke activated prior to arrival. Pt currently denies headache. No change in speech or vision. No focal or one-sided numbness or weakness.  Indicates did not have his bp meds yet today,  but did take normal meds yesterday. Denies hx pud, pancreatitis, or gallstones.   The history is provided by the patient, medical records and the EMS personnel. The history is limited by the condition of the patient.       Home Medications Prior to Admission medications   Medication Sig Start Date End Date Taking? Authorizing Provider  ondansetron (ZOFRAN-ODT) 8 MG disintegrating tablet Take 1 tablet (8 mg total) by mouth every 8 (eight) hours as needed for nausea or vomiting. 10/07/23  Yes Cathren Laine, MD  pantoprazole (PROTONIX) 40 MG tablet Take 1 tablet (40 mg total) by mouth daily. 10/07/23  Yes Cathren Laine, MD  ABILIFY MAINTENA 400 MG PRSY prefilled syringe Inject 1 each into the muscle every 30 (thirty) days. 03/18/22   [provider]  alum & mag hydroxide-simeth (MAALOX PLUS) 400-400-40 MG/5ML suspension Take 15 mLs by mouth every 6 (six) hours as needed for indigestion. 05/28/22   Kommor, Madison, MD  atorvastatin (LIPITOR) 40 MG tablet Take 40 mg by mouth daily.    [provider]  busPIRone (BUSPAR) 15 MG tablet Take  15 mg by mouth 2 (two) times daily. 03/18/22   [provider]  diclofenac Sodium (VOLTAREN) 1 % GEL Apply 1 application. topically daily as needed (For pain). 03/18/22   [provider]  dicyclomine (BENTYL) 20 MG tablet Take 1 tablet (20 mg total) by mouth 2 (two) times daily. 05/28/22   Kommor, Madison, MD  DULoxetine (CYMBALTA) 20 MG capsule Take 20 mg by mouth daily. 01/16/22   [provider]  famotidine (PEPCID) 20 MG tablet Take 1 tablet (20 mg total) by mouth 2 (two) times daily. 05/28/22   Kommor, Madison, MD  gabapentin (NEURONTIN) 300 MG capsule Take 300 mg by mouth daily as needed (For back pain). 03/18/22   [provider]  insulin glargine (LANTUS SOLOSTAR) 100 UNIT/ML Solostar Pen Inject 40 Units into the skin daily. 04/24/18   [provider]  JARDIANCE 25 MG TABS tablet Take 25 mg by mouth daily. 02/04/22   [provider]  LINZESS 145 MCG CAPS capsule Take 145 mcg by mouth daily. 03/18/22   [provider]  losartan (COZAAR) 100 MG tablet Take 100 mg by mouth daily. 05/13/22   [provider]  ondansetron (ZOFRAN) 4 MG tablet Take 4 mg by mouth every 8 (eight) hours as needed for nausea/vomiting. 05/13/22   [provider]  ondansetron (ZOFRAN-ODT) 4 MG disintegrating tablet Take 1 tablet (4 mg total) by mouth every 8 (eight) hours as needed for nausea or vomiting. 05/26/22   Cheryll Cockayne,  MD  pantoprazole (PROTONIX) 40 MG tablet Take 40 mg by mouth daily. 08/08/20   [provider]  prochlorperazine (COMPAZINE) 10 MG tablet Take 1 tablet (10 mg total) by mouth 2 (two) times daily as needed for nausea or vomiting. 12/24/21   Arthor Captain, PA-C  promethazine (PHENERGAN) 25 MG suppository Place 1 suppository (25 mg total) rectally every 6 (six) hours as needed for nausea. 10/04/22   Derwood Kaplan, MD  sucralfate (CARAFATE) 1 g tablet Take 1 tablet (1 g total) by mouth 4 (four) times daily -  with meals and at  bedtime. 10/04/22   Derwood Kaplan, MD  tiZANidine (ZANAFLEX) 4 MG tablet Take 4 mg by mouth 3 (three) times daily. 01/29/22   [provider]      Allergies    Metformin and related and Reglan [metoclopramide]    Review of Systems   Review of Systems  Constitutional:  Negative for fever.  HENT:  Negative for sore throat.   Eyes:  Negative for redness and visual disturbance.  Respiratory:  Negative for cough and shortness of breath.   Cardiovascular:  Negative for chest pain.  Gastrointestinal:  Positive for abdominal pain, nausea and vomiting. Negative for constipation and diarrhea.  Genitourinary:  Negative for dysuria and flank pain.  Musculoskeletal:  Negative for back pain and neck pain.  Skin:  Negative for rash.  Neurological:  Negative for speech difficulty, weakness, numbness and headaches.  Hematological:  Does not bruise/bleed easily.    Physical Exam Updated Vital Signs BP (!) 192/92   Pulse 81   Temp (!) 97.2 F (36.2 C) (Temporal)   Resp 14   Wt 84.8 kg   SpO2 97%   BMI 30.17 kg/m  Physical Exam Vitals and nursing note reviewed.  Constitutional:      Appearance: Normal appearance. He is well-developed.  HENT:     Head: Atraumatic.     Nose: Nose normal.     Mouth/Throat:     Mouth: Mucous membranes are moist.     Pharynx: Oropharynx is clear.  Eyes:     General: No scleral icterus.    Conjunctiva/sclera: Conjunctivae normal.     Pupils: Pupils are equal, round, and reactive to light.  Neck:     Vascular: No carotid bruit.     Trachea: No tracheal deviation.  Cardiovascular:     Rate and Rhythm: Normal rate and regular rhythm.     Pulses: Normal pulses.     Heart sounds: Normal heart sounds. No murmur heard.    No friction rub. No gallop.  Pulmonary:     Effort: Pulmonary effort is normal. No accessory muscle usage or respiratory distress.     Breath sounds: Normal breath sounds.  Abdominal:     General: Bowel sounds are normal. There  is no distension.     Palpations: Abdomen is soft.     Tenderness: There is abdominal tenderness. There is no guarding.     Comments: Epigastric tenderness.   Genitourinary:    Comments: No cva tenderness. Musculoskeletal:        General: No swelling or tenderness.     Cervical back: Normal range of motion and neck supple. No rigidity.     Right lower leg: No edema.     Left lower leg: No edema.  Skin:    General: Skin is warm and dry.     Findings: No rash.  Neurological:     Mental Status: He is alert.  Comments: Alert, speech clear. Motor/sens grossly intact bil, stre 5/5.   Psychiatric:     Comments: Odd affect, at times closes eyes, at times complies with questions and neuro exam, and at other times less consistent response.     ED Results / Procedures / Treatments   Labs (all labs ordered are listed, but only abnormal results are displayed) Results for orders placed or performed during the hospital encounter of 10/07/23  Ethanol  Result Value Ref Range   Alcohol, Ethyl (B) <10 <10 mg/dL  Protime-INR  Result Value Ref Range   Prothrombin Time 13.1 11.4 - 15.2 seconds   INR 1.0 0.8 - 1.2  APTT  Result Value Ref Range   aPTT 25 24 - 36 seconds  CBC  Result Value Ref Range   WBC 11.0 (H) 4.0 - 10.5 K/uL   RBC 4.46 4.22 - 5.81 MIL/uL   Hemoglobin 12.2 (L) 13.0 - 17.0 g/dL   HCT 09.8 (L) 11.9 - 14.7 %   MCV 81.2 80.0 - 100.0 fL   MCH 27.4 26.0 - 34.0 pg   MCHC 33.7 30.0 - 36.0 g/dL   RDW 82.9 56.2 - 13.0 %   Platelets 280 150 - 400 K/uL   nRBC 0.0 0.0 - 0.2 %  Differential  Result Value Ref Range   Neutrophils Relative % 88 %   Neutro Abs 9.7 (H) 1.7 - 7.7 K/uL   Lymphocytes Relative 8 %   Lymphs Abs 0.8 0.7 - 4.0 K/uL   Monocytes Relative 3 %   Monocytes Absolute 0.4 0.1 - 1.0 K/uL   Eosinophils Relative 0 %   Eosinophils Absolute 0.0 0.0 - 0.5 K/uL   Basophils Relative 0 %   Basophils Absolute 0.0 0.0 - 0.1 K/uL   Immature Granulocytes 1 %   Abs  Immature Granulocytes 0.05 0.00 - 0.07 K/uL  Comprehensive metabolic panel  Result Value Ref Range   Sodium 136 135 - 145 mmol/L   Potassium 3.8 3.5 - 5.1 mmol/L   Chloride 103 98 - 111 mmol/L   CO2 24 22 - 32 mmol/L   Glucose, Bld 217 (H) 70 - 99 mg/dL   BUN 17 6 - 20 mg/dL   Creatinine, Ser 8.65 (H) 0.61 - 1.24 mg/dL   Calcium 9.2 8.9 - 78.4 mg/dL   Total Protein 6.8 6.5 - 8.1 g/dL   Albumin 3.4 (L) 3.5 - 5.0 g/dL   AST 16 15 - 41 U/L   ALT 16 0 - 44 U/L   Alkaline Phosphatase 73 38 - 126 U/L   Total Bilirubin 1.3 (H) <1.2 mg/dL   GFR, Estimated 51 (L) >60 mL/min   Anion gap 9 5 - 15  Urine rapid drug screen (hosp performed)  Result Value Ref Range   Opiates NONE DETECTED NONE DETECTED   Cocaine POSITIVE (A) NONE DETECTED   Benzodiazepines NONE DETECTED NONE DETECTED   Amphetamines NONE DETECTED NONE DETECTED   Tetrahydrocannabinol POSITIVE (A) NONE DETECTED   Barbiturates NONE DETECTED NONE DETECTED  Urinalysis, Routine w reflex microscopic -Urine, Clean Catch  Result Value Ref Range   Color, Urine YELLOW YELLOW   APPearance CLEAR CLEAR   Specific Gravity, Urine 1.024 1.005 - 1.030   pH 7.0 5.0 - 8.0   Glucose, UA 150 (A) NEGATIVE mg/dL   Hgb urine dipstick SMALL (A) NEGATIVE   Bilirubin Urine NEGATIVE NEGATIVE   Ketones, ur 5 (A) NEGATIVE mg/dL   Protein, ur >=696 (A) NEGATIVE mg/dL   Nitrite NEGATIVE  NEGATIVE   Leukocytes,Ua NEGATIVE NEGATIVE   RBC / HPF 6-10 0 - 5 RBC/hpf   WBC, UA 0-5 0 - 5 WBC/hpf   Bacteria, UA RARE (A) NONE SEEN   Squamous Epithelial / HPF 0-5 0 - 5 /HPF   Mucus PRESENT    Hyaline Casts, UA PRESENT   Lipase, blood  Result Value Ref Range   Lipase 22 11 - 51 U/L  I-stat chem 8, ED  Result Value Ref Range   Sodium 137 135 - 145 mmol/L   Potassium 3.8 3.5 - 5.1 mmol/L   Chloride 104 98 - 111 mmol/L   BUN 18 6 - 20 mg/dL   Creatinine, Ser 1.61 (H) 0.61 - 1.24 mg/dL   Glucose, Bld 096 (H) 70 - 99 mg/dL   Calcium, Ion 0.45 (L) 1.15 - 1.40  mmol/L   TCO2 22 22 - 32 mmol/L   Hemoglobin 12.2 (L) 13.0 - 17.0 g/dL   HCT 40.9 (L) 81.1 - 91.4 %  Troponin I (High Sensitivity)  Result Value Ref Range   Troponin I (High Sensitivity) 13 <18 ng/L  Troponin I (High Sensitivity)  Result Value Ref Range   Troponin I (High Sensitivity) 12 <18 ng/L   CT ABDOMEN PELVIS WO CONTRAST  Result Date: 10/07/2023 CLINICAL DATA:  Acute nonlocalized upper abdominal pain EXAM: CT ABDOMEN AND PELVIS WITHOUT CONTRAST TECHNIQUE: Multidetector CT imaging of the abdomen and pelvis was performed following the standard protocol without IV contrast. RADIATION DOSE REDUCTION: This exam was performed according to the departmental dose-optimization program which includes automated exposure control, adjustment of the mA and/or kV according to patient size and/or use of iterative reconstruction technique. COMPARISON:  03/10/2023 FINDINGS: Lower chest: No acute abnormality. Hepatobiliary: Unremarkable noncontrast appearance of the liver, gallbladder, and biliary tree. Pancreas: Unremarkable. No pancreatic ductal dilatation or surrounding inflammatory changes. Spleen: Unremarkable. Adrenals/Urinary Tract: Unremarkable adrenal glands. Contrast from same-day CTA head and neck within the renal collecting systems, ureters, and bladder no hydronephrosis. Stomach/Bowel: Large colonic stool burden. No bowel wall thickening. Stomach and appendix are within normal limits. Vascular/Lymphatic: No significant vascular findings are present. No enlarged abdominal or pelvic lymph nodes. Reproductive: Unremarkable. Other: No free intraperitoneal fluid or air. Musculoskeletal: No acute fracture. IMPRESSION: 1. No acute abnormality in the abdomen or pelvis. 2. Constipation. Electronically Signed   By: Minerva Fester M.D.   On: 10/07/2023 21:11   CT ANGIO HEAD NECK W WO CM (CODE STROKE)  Result Date: 10/07/2023 CLINICAL DATA:  Neuro deficit, acute, stroke suspected. Right facial droop. EXAM:  CT ANGIOGRAPHY HEAD AND NECK WITH AND WITHOUT CONTRAST TECHNIQUE: Multidetector CT imaging of the head and neck was performed using the standard protocol during bolus administration of intravenous contrast. Multiplanar CT image reconstructions and MIPs were obtained to evaluate the vascular anatomy. Carotid stenosis measurements (when applicable) are obtained utilizing NASCET criteria, using the distal internal carotid diameter as the denominator. RADIATION DOSE REDUCTION: This exam was performed according to the departmental dose-optimization program which includes automated exposure control, adjustment of the mA and/or kV according to patient size and/or use of iterative reconstruction technique. CONTRAST:  75mL OMNIPAQUE IOHEXOL 350 MG/ML SOLN COMPARISON:  None Available. FINDINGS: CTA NECK FINDINGS Aortic arch: Normal variant aortic arch branching pattern with common origin of the brachiocephalic and left common carotid arteries. Widely patent arch vessel origins. Right carotid system: Patent with calcified and soft plaque at the carotid bifurcation resulting in less than 50% stenosis of the ICA origin. Left carotid system:  Patent with a small amount of soft and calcified plaque in the carotid bulb. No significant stenosis. Vertebral arteries: Patent and codominant without evidence of stenosis or dissection. Skeleton: Focally advanced disc degeneration at C5-6 with at least mild spinal stenosis and moderate to severe left neural foraminal stenosis. Other neck: No evidence of cervical lymphadenopathy or mass. Upper chest: Clear lung apices. Review of the MIP images confirms the above findings CTA HEAD FINDINGS Anterior circulation: The internal carotid arteries are patent from skull base to carotid termini with mild atherosclerosis bilaterally not resulting in significant stenosis. ACAs and MCAs are patent without evidence of a proximal branch occlusion or significant proximal stenosis. No aneurysm is identified.  Posterior circulation: The intracranial vertebral arteries are widely patent to the basilar. Patent PICA and SCA origins are seen bilaterally. The basilar artery is widely patent. There is a patent left posterior communicating artery. Both PCAs are patent without evidence of a significant proximal stenosis. No aneurysm is identified. Venous sinuses: Patent. Anatomic variants: None. Review of the MIP images confirms the above findings Code stroke imaging results were communicated on 10/07/2023 at 5:38 pm to provider Dr. Wilford Corner via text paging. 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 Electronically Signed   By: Sebastian Ache M.D.   On: 10/07/2023 17:58   CT HEAD CODE STROKE WO CONTRAST  Result Date: 10/07/2023 CLINICAL DATA:  Code stroke. Neuro deficit, acute, stroke suspected. Right facial droop. EXAM: CT HEAD WITHOUT CONTRAST TECHNIQUE: Contiguous axial images were obtained from the base of the skull through the vertex without intravenous contrast. RADIATION DOSE REDUCTION: This exam was performed according to the departmental dose-optimization program which includes automated exposure control, adjustment of the mA and/or kV according to patient size and/or use of iterative reconstruction technique. COMPARISON:  Head CT 04/04/2022 and MRI 12/13/2020 FINDINGS: Brain: There is no evidence of an acute infarct, intracranial hemorrhage, mass, midline shift, or extra-axial fluid collection. The ventricles and sulci are normal. Vascular: No hyperdense vessel. Skull: No acute fracture or suspicious osseous lesion. Sinuses/Orbits: Mild mucosal thickening in the included portion of the left maxillary sinus. Clear mastoid air cells. Unremarkable orbits. Other: None. ASPECTS (Alberta Stroke Program Early CT Score) - Ganglionic level infarction (caudate, lentiform nuclei, internal capsule, insula, M1-M3 cortex): 7 - Supraganglionic infarction  (M4-M6 cortex): 3 Total score (0-10 with 10 being normal): 10 These results were communicated to Dr. Wilford Corner at 5:34 pm on 10/07/2023 by text page via the [facility] messaging system. IMPRESSION: Negative head CT.  ASPECTS of 10. Electronically Signed   By: Sebastian Ache M.D.   On: 10/07/2023 17:34    EKG EKG Interpretation Date/Time:  Tuesday October 07 2023 17:44:26 EST Ventricular Rate:  93 PR Interval:  171 QRS Duration:  88 QT Interval:  399 QTC Calculation: 497 R Axis:   79  Text Interpretation: Sinus tachycardia Non-specific ST-t changes No significant change since last tracing Confirmed by Cathren Laine ([id number]) on 10/07/2023 5:57:42 PM  Radiology CT ABDOMEN PELVIS WO CONTRAST  Result Date: 10/07/2023 CLINICAL DATA:  Acute nonlocalized upper abdominal pain EXAM: CT ABDOMEN AND PELVIS WITHOUT CONTRAST TECHNIQUE: Multidetector CT imaging of the abdomen and pelvis was performed following the standard protocol without IV contrast. RADIATION DOSE REDUCTION: This exam was performed according to the departmental dose-optimization program which includes automated exposure control, adjustment of the mA and/or kV according to patient size and/or use of iterative reconstruction technique. COMPARISON:  03/10/2023 FINDINGS:  Lower chest: No acute abnormality. Hepatobiliary: Unremarkable noncontrast appearance of the liver, gallbladder, and biliary tree. Pancreas: Unremarkable. No pancreatic ductal dilatation or surrounding inflammatory changes. Spleen: Unremarkable. Adrenals/Urinary Tract: Unremarkable adrenal glands. Contrast from same-day CTA head and neck within the renal collecting systems, ureters, and bladder no hydronephrosis. Stomach/Bowel: Large colonic stool burden. No bowel wall thickening. Stomach and appendix are within normal limits. Vascular/Lymphatic: No significant vascular findings are present. No enlarged abdominal or pelvic lymph nodes. Reproductive: Unremarkable. Other: No free  intraperitoneal fluid or air. Musculoskeletal: No acute fracture. IMPRESSION: 1. No acute abnormality in the abdomen or pelvis. 2. Constipation. Electronically Signed   By: Minerva Fester M.D.   On: 10/07/2023 21:11   CT ANGIO HEAD NECK W WO CM (CODE STROKE)  Result Date: 10/07/2023 CLINICAL DATA:  Neuro deficit, acute, stroke suspected. Right facial droop. EXAM: CT ANGIOGRAPHY HEAD AND NECK WITH AND WITHOUT CONTRAST TECHNIQUE: Multidetector CT imaging of the head and neck was performed using the standard protocol during bolus administration of intravenous contrast. Multiplanar CT image reconstructions and MIPs were obtained to evaluate the vascular anatomy. Carotid stenosis measurements (when applicable) are obtained utilizing NASCET criteria, using the distal internal carotid diameter as the denominator. RADIATION DOSE REDUCTION: This exam was performed according to the departmental dose-optimization program which includes automated exposure control, adjustment of the mA and/or kV according to patient size and/or use of iterative reconstruction technique. CONTRAST:  75mL OMNIPAQUE IOHEXOL 350 MG/ML SOLN COMPARISON:  None Available. FINDINGS: CTA NECK FINDINGS Aortic arch: Normal variant aortic arch branching pattern with common origin of the brachiocephalic and left common carotid arteries. Widely patent arch vessel origins. Right carotid system: Patent with calcified and soft plaque at the carotid bifurcation resulting in less than 50% stenosis of the ICA origin. Left carotid system: Patent with a small amount of soft and calcified plaque in the carotid bulb. No significant stenosis. Vertebral arteries: Patent and codominant without evidence of stenosis or dissection. Skeleton: Focally advanced disc degeneration at C5-6 with at least mild spinal stenosis and moderate to severe left neural foraminal stenosis. Other neck: No evidence of cervical lymphadenopathy or mass. Upper chest: Clear lung apices. Review  of the MIP images confirms the above findings CTA HEAD FINDINGS Anterior circulation: The internal carotid arteries are patent from skull base to carotid termini with mild atherosclerosis bilaterally not resulting in significant stenosis. ACAs and MCAs are patent without evidence of a proximal branch occlusion or significant proximal stenosis. No aneurysm is identified. Posterior circulation: The intracranial vertebral arteries are widely patent to the basilar. Patent PICA and SCA origins are seen bilaterally. The basilar artery is widely patent. There is a patent left posterior communicating artery. Both PCAs are patent without evidence of a significant proximal stenosis. No aneurysm is identified. Venous sinuses: Patent. Anatomic variants: None. Review of the MIP images confirms the above findings Code stroke imaging results were communicated on 10/07/2023 at 5:38 pm to provider Dr. Wilford Corner via text paging. 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 Electronically Signed   By: Sebastian Ache M.D.   On: 10/07/2023 17:58   CT HEAD CODE STROKE WO CONTRAST  Result Date: 10/07/2023 CLINICAL DATA:  Code stroke. Neuro deficit, acute, stroke suspected. Right facial droop. EXAM: CT HEAD WITHOUT CONTRAST TECHNIQUE: Contiguous axial images were obtained from the base of the skull through the vertex without intravenous contrast. RADIATION DOSE REDUCTION: This exam was performed according to the departmental  dose-optimization program which includes automated exposure control, adjustment of the mA and/or kV according to patient size and/or use of iterative reconstruction technique. COMPARISON:  Head CT 04/04/2022 and MRI 12/13/2020 FINDINGS: Brain: There is no evidence of an acute infarct, intracranial hemorrhage, mass, midline shift, or extra-axial fluid collection. The ventricles and sulci are normal. Vascular: No hyperdense vessel. Skull: No acute  fracture or suspicious osseous lesion. Sinuses/Orbits: Mild mucosal thickening in the included portion of the left maxillary sinus. Clear mastoid air cells. Unremarkable orbits. Other: None. ASPECTS (Alberta Stroke Program Early CT Score) - Ganglionic level infarction (caudate, lentiform nuclei, internal capsule, insula, M1-M3 cortex): 7 - Supraganglionic infarction (M4-M6 cortex): 3 Total score (0-10 with 10 being normal): 10 These results were communicated to Dr. Wilford Corner at 5:34 pm on 10/07/2023 by text page via the [facility] messaging system. IMPRESSION: Negative head CT.  ASPECTS of 10. Electronically Signed   By: Sebastian Ache M.D.   On: 10/07/2023 17:34    Procedures Procedures    Medications Ordered in ED Medications  iohexol (OMNIPAQUE) 350 MG/ML injection 75 mL (75 mLs Intravenous Contrast Given 10/07/23 1732)  labetalol (NORMODYNE) injection 20 mg (20 mg Intravenous Given 10/07/23 1818)  morphine (PF) 4 MG/ML injection 4 mg (4 mg Intravenous Given 10/07/23 1804)  ondansetron (ZOFRAN) injection 4 mg (4 mg Intravenous Given 10/07/23 1804)  pantoprazole (PROTONIX) injection 80 mg (80 mg Intravenous Given 10/07/23 1808)  ondansetron (ZOFRAN) injection 4 mg (4 mg Intravenous Given 10/07/23 1912)  diazepam (VALIUM) injection 2.5 mg (2.5 mg Intravenous Given 10/07/23 1914)  droperidol (INAPSINE) 2.5 MG/ML injection 1.25 mg (1.25 mg Intravenous Given 10/07/23 2202)    ED Course/ Medical Decision Making/ A&P                                 Medical Decision Making Problems Addressed: Cocaine use disorder [facility]): acute illness or injury with systemic symptoms that poses a threat to life or bodily functions Marijuana use: acute illness or injury Nausea and vomiting in adult: acute illness or injury with systemic symptoms that poses a threat to life or bodily functions Stage 3a chronic kidney disease (HCC): chronic illness or injury with exacerbation, progression, or side effects of treatment that  poses a threat to life or bodily functions Uncontrolled hypertension: acute illness or injury with systemic symptoms that poses a threat to life or bodily functions Upper abdominal pain: acute illness or injury with systemic symptoms that poses a threat to life or bodily functions  Amount and/or Complexity of Data Reviewed Independent Historian: EMS    Details: hx External Data Reviewed: notes. Labs: ordered. Decision-making details documented in ED Course. Radiology: ordered and independent interpretation performed. Decision-making details documented in ED Course. ECG/medicine tests: ordered and independent interpretation performed. Decision-making details documented in ED Course. Discussion of management or test interpretation with external provider(s): neurology  Risk Prescription drug management. Parenteral controlled substances. Decision regarding hospitalization.   Iv ns. Continuous pulse ox and cardiac monitoring. Labs ordered/sent. Imaging ordered.   Differential diagnosis includes cva, pancreatitis, pud/gastritis, etc. Dispo decision including potential need for admission considered - will get labs and imaging and reassess.   Reviewed nursing notes and prior charts for additional history. External reports reviewed. Additional history from: EMS.   Morphine iv, zofran iv, protonix iv. Bp high, hx same. Neurology indicates would treat bp. Labetalol iv.   Cardiac monitor: sinus rhythm, rate 88.  Labs reviewed/interpreted  by me - lipase normal. UDS w cocaine and thc. Given upper abd pain and nv, ?possible chs.   CT reviewed/interpreted by me - no cva. No acute abd process.   Relief w meds. No recurrent emesis. Bp improved.   2302 mri pending. Signed out to Dr Madilyn Hook, if MR negative, anticipate probable d/c to home. Pcp f/u. Pt indicates has adequate of his bp meds at home.  Rx provided for protonix/zofran.             Final Clinical Impression(s) / ED Diagnoses Final  diagnoses:  Upper abdominal pain  Nausea and vomiting in adult  Cocaine use disorder (HCC)  Uncontrolled hypertension  Marijuana use    Rx / DC Orders ED Discharge Orders          Ordered    pantoprazole (PROTONIX) 40 MG tablet  Daily        10/07/23 2302    ondansetron (ZOFRAN-ODT) 8 MG disintegrating tablet  Every 8 hours PRN        10/07/23 2302               Cathren Laine, MD 10/07/23 2310

## 2023-10-07 NOTE — Code Documentation (Signed)
Stroke Response Nurse Documentation Code Documentation  James Chang is a 57 y.o. male arriving to [facility]  via [facility] on 10/07/2023 with past medical hx of abdominal pain, CKD III, depression, DM, migraine, HLD, HTN, drug abuse, stroke 2008. On No antithrombotic. Code stroke was activated by ED.   Patient from home where he was LKW two days and now complaining of decreased responsiveness, weakness, and HTN. EMS was called for abdominal pain but the patient developed weakness, hypertension, and decreased responsiveness en route to the hospital. Code Stroke was activated on arrival.  Stroke team at the bedside on patient arrival. Labs drawn and patient cleared for CT by Dr. Denton Lank. Patient to CT with team. NIHSS 6, see documentation for details and code stroke times. Patient with decreased LOC, disoriented, bilateral arm weakness, left leg weakness, and dysarthria  on exam. The following imaging was completed:  CT Head and CTA. Patient is not a candidate for IV Thrombolytic due to out of window for TNK. Patient is not a candidate for IR due to no LVO noted on imaging.   Care Plan: VS/NIHSS q2hrs, BP Goal <220/120.   Bedside handoff with ED RN Dahlia Client.    Felecia Jan  Stroke Response RN

## 2023-10-07 NOTE — ED Notes (Signed)
Patient transported to MRI 

## 2023-10-07 NOTE — ED Notes (Signed)
Pt returns from MRI at this time. VS updated. Call bell within reach

## 2023-10-08 ENCOUNTER — Encounter (HOSPITAL_COMMUNITY): Payer: Self-pay

## 2023-10-08 ENCOUNTER — Other Ambulatory Visit: Payer: Self-pay

## 2023-10-08 DIAGNOSIS — R112 Nausea with vomiting, unspecified: Secondary | ICD-10-CM | POA: Diagnosis not present

## 2023-10-08 LAB — CBG MONITORING, ED: Glucose-Capillary: 192 mg/dL — ABNORMAL HIGH (ref 70–99)

## 2023-10-08 MED ORDER — MORPHINE SULFATE (PF) 4 MG/ML IV SOLN
4.0000 mg | Freq: Once | INTRAVENOUS | Status: AC
  Administered 2023-10-08: 4 mg via INTRAVENOUS
  Filled 2023-10-08: qty 1

## 2023-10-08 MED ORDER — SUCRALFATE 1 G PO TABS
1.0000 g | ORAL_TABLET | Freq: Once | ORAL | Status: AC
  Administered 2023-10-08: 1 g via ORAL
  Filled 2023-10-08: qty 1

## 2023-10-08 MED ORDER — LABETALOL HCL 5 MG/ML IV SOLN
20.0000 mg | Freq: Once | INTRAVENOUS | Status: AC
  Administered 2023-10-08: 20 mg via INTRAVENOUS
  Filled 2023-10-08: qty 4

## 2023-10-08 MED ORDER — LIDOCAINE VISCOUS HCL 2 % MT SOLN
15.0000 mL | Freq: Once | OROMUCOSAL | Status: AC
  Administered 2023-10-08: 15 mL via OROMUCOSAL
  Filled 2023-10-08: qty 15

## 2023-10-08 MED ORDER — LORAZEPAM 2 MG/ML IJ SOLN
1.0000 mg | Freq: Once | INTRAMUSCULAR | Status: AC
  Administered 2023-10-08: 1 mg via INTRAVENOUS
  Filled 2023-10-08: qty 1

## 2023-10-08 MED ORDER — ONDANSETRON HCL 4 MG/2ML IJ SOLN
4.0000 mg | Freq: Once | INTRAMUSCULAR | Status: AC
  Administered 2023-10-08: 4 mg via INTRAVENOUS
  Filled 2023-10-08: qty 2

## 2023-10-08 NOTE — ED Notes (Signed)
Pt tolerating fluids and resolution of NV

## 2023-10-08 NOTE — ED Notes (Signed)
Pt offered water and ice chips for po challenge

## 2023-10-08 NOTE — ED Notes (Signed)
Pt leaving to lobby to meet ride, refused vitals and discharge paperwork; pt a and o x 4, ambulatory with steady gait

## 2023-10-08 NOTE — ED Notes (Signed)
Pt placed in room 48. Plan to to metabolise to freedom.

## 2023-10-08 NOTE — ED Notes (Signed)
Pt sleeping. 

## 2023-10-08 NOTE — ED Notes (Signed)
Pt pending discharge at this time. Plan to metabolize ativan and ambulate prior to discharge. Pt updated by provider Dr Madilyn Hook. Pt verbalized understanding. Music therapist informed.

## 2023-10-20 DIAGNOSIS — I10 Essential (primary) hypertension: Secondary | ICD-10-CM

## 2023-11-08 ENCOUNTER — Emergency Department (HOSPITAL_COMMUNITY)
Admission: EM | Admit: 2023-11-08 | Discharge: 2023-11-08 | Disposition: A | Discharge status: Home / Self Care | Payer: 59 | Attending: Emergency Medicine | Admitting: Emergency Medicine

## 2023-11-08 ENCOUNTER — Other Ambulatory Visit: Payer: Self-pay

## 2023-11-08 DIAGNOSIS — R112 Nausea with vomiting, unspecified: Secondary | ICD-10-CM | POA: Insufficient documentation

## 2023-11-08 DIAGNOSIS — Z794 Long term (current) use of insulin: Secondary | ICD-10-CM | POA: Insufficient documentation

## 2023-11-08 DIAGNOSIS — R1013 Epigastric pain: Secondary | ICD-10-CM | POA: Diagnosis not present

## 2023-11-08 LAB — COMPREHENSIVE METABOLIC PANEL
ALT: 15 U/L (ref 0–44)
AST: 15 U/L (ref 15–41)
Albumin: 3.4 g/dL — ABNORMAL LOW (ref 3.5–5.0)
Alkaline Phosphatase: 68 U/L (ref 38–126)
Anion gap: 11 (ref 5–15)
BUN: 25 mg/dL — ABNORMAL HIGH (ref 6–20)
CO2: 25 mmol/L (ref 22–32)
Calcium: 9.5 mg/dL (ref 8.9–10.3)
Chloride: 97 mmol/L — ABNORMAL LOW (ref 98–111)
Creatinine, Ser: 1.76 mg/dL — ABNORMAL HIGH (ref 0.61–1.24)
GFR, Estimated: 44 mL/min — ABNORMAL LOW (ref >60)
Glucose, Bld: 155 mg/dL — ABNORMAL HIGH (ref 70–99)
Potassium: 3.9 mmol/L (ref 3.5–5.1)
Sodium: 133 mmol/L — ABNORMAL LOW (ref 135–145)
Total Bilirubin: 0.9 mg/dL (ref <1.2)
Total Protein: 6.9 g/dL (ref 6.5–8.1)

## 2023-11-08 LAB — CBC WITH DIFFERENTIAL/PLATELET
Abs Immature Granulocytes: 0.07 10*3/uL (ref 0.00–0.07)
Basophils Absolute: 0 10*3/uL (ref 0.0–0.1)
Basophils Relative: 0 %
Eosinophils Absolute: 0 10*3/uL (ref 0.0–0.5)
Eosinophils Relative: 0 %
HCT: 38.1 % — ABNORMAL LOW (ref 39.0–52.0)
Hemoglobin: 13 g/dL (ref 13.0–17.0)
Immature Granulocytes: 1 %
Lymphocytes Relative: 12 %
Lymphs Abs: 1.3 10*3/uL (ref 0.7–4.0)
MCH: 28.1 pg (ref 26.0–34.0)
MCHC: 34.1 g/dL (ref 30.0–36.0)
MCV: 82.3 fL (ref 80.0–100.0)
Monocytes Absolute: 0.5 10*3/uL (ref 0.1–1.0)
Monocytes Relative: 4 %
Neutro Abs: 8.5 10*3/uL — ABNORMAL HIGH (ref 1.7–7.7)
Neutrophils Relative %: 83 %
Platelets: 362 10*3/uL (ref 150–400)
RBC: 4.63 MIL/uL (ref 4.22–5.81)
RDW: 13.1 % (ref 11.5–15.5)
WBC: 10.4 10*3/uL (ref 4.0–10.5)
nRBC: 0 % (ref 0.0–0.2)

## 2023-11-08 LAB — LIPASE, BLOOD: Lipase: 20 U/L (ref 11–51)

## 2023-11-08 MED ORDER — DROPERIDOL 2.5 MG/ML IJ SOLN
1.2500 mg | Freq: Once | INTRAMUSCULAR | Status: AC
  Administered 2023-11-08: 1.25 mg via INTRAVENOUS
  Filled 2023-11-08: qty 2

## 2023-11-08 MED ORDER — MORPHINE SULFATE (PF) 4 MG/ML IV SOLN
4.0000 mg | Freq: Once | INTRAVENOUS | Status: AC
  Administered 2023-11-08: 4 mg via INTRAVENOUS
  Filled 2023-11-08: qty 1

## 2023-11-08 MED ORDER — ALUM & MAG HYDROXIDE-SIMETH 200-200-20 MG/5ML PO SUSP
30.0000 mL | Freq: Once | ORAL | Status: AC
  Administered 2023-11-08: 30 mL via ORAL
  Filled 2023-11-08: qty 30

## 2023-11-08 MED ORDER — DIPHENHYDRAMINE HCL 50 MG/ML IJ SOLN
25.0000 mg | Freq: Once | INTRAMUSCULAR | Status: AC
  Administered 2023-11-08: 25 mg via INTRAVENOUS
  Filled 2023-11-08: qty 1

## 2023-11-08 MED ORDER — SODIUM CHLORIDE 0.9 % IV BOLUS
1000.0000 mL | Freq: Once | INTRAVENOUS | Status: AC
  Administered 2023-11-08: 1000 mL via INTRAVENOUS

## 2023-11-08 MED ORDER — PROMETHAZINE HCL 25 MG RE SUPP: 25.0000 mg | Freq: Four times a day (QID) | RECTAL | 0 refills | Status: AC | PRN

## 2023-11-08 MED ORDER — ONDANSETRON 4 MG PO TBDP: ORAL_TABLET | ORAL | 0 refills | Status: AC

## 2023-11-08 NOTE — Discharge Instructions (Signed)
Try pepcid or tagamet up to twice a day.  Try to avoid things that may make this worse, most commonly these are spicy foods tomato based products fatty foods chocolate and peppermint.  Alcohol and tobacco can also make this worse.  Return to the emergency department for sudden worsening pain fever or inability to eat or drink.   Follow-up with your family doctor in the office.

## 2023-11-08 NOTE — ED Provider Notes (Signed)
[place] EMERGENCY DEPARTMENT AT [facility] Provider Note   CSN: [id number] Arrival date & time: 11/08/23  1191     History  No chief complaint on file.   James Chang is a 57 y.o. male.  57 yo M with a cc of upper abdominal pain.  This has been going on for about a week.  He tells me he cannot eat or drink anything because it makes it worse and makes him vomit.  No diarrhea no fevers.        Home Medications Prior to Admission medications   Medication Sig Start Date End Date Taking? Authorizing Provider  ondansetron (ZOFRAN-ODT) 4 MG disintegrating tablet 4mg  ODT q4 hours prn nausea/vomit 11/08/23  Yes Melene Plan, DO  promethazine (PHENERGAN) 25 MG suppository Place 1 suppository (25 mg total) rectally every 6 (six) hours as needed for nausea or vomiting. 11/08/23  Yes Adela Lank, Veleria Barnhardt, DO  ABILIFY MAINTENA 400 MG PRSY prefilled syringe Inject 1 each into the muscle every 30 (thirty) days. 03/18/22   [provider]  alum & mag hydroxide-simeth (MAALOX PLUS) 400-400-40 MG/5ML suspension Take 15 mLs by mouth every 6 (six) hours as needed for indigestion. 05/28/22   Kommor, Madison, MD  atorvastatin (LIPITOR) 40 MG tablet Take 40 mg by mouth daily.    [provider]  busPIRone (BUSPAR) 15 MG tablet Take 15 mg by mouth 2 (two) times daily. 03/18/22   [provider]  diclofenac Sodium (VOLTAREN) 1 % GEL Apply 1 application. topically daily as needed (For pain). 03/18/22   [provider]  dicyclomine (BENTYL) 20 MG tablet Take 1 tablet (20 mg total) by mouth 2 (two) times daily. 05/28/22   Kommor, Madison, MD  DULoxetine (CYMBALTA) 20 MG capsule Take 20 mg by mouth daily. 01/16/22   [provider]  famotidine (PEPCID) 20 MG tablet Take 1 tablet (20 mg total) by mouth 2 (two) times daily. 05/28/22   Kommor, Madison, MD  gabapentin (NEURONTIN) 300 MG capsule Take 300 mg by mouth daily as needed (For back pain). 03/18/22   [provider]  insulin glargine (LANTUS SOLOSTAR) 100 UNIT/ML Solostar Pen Inject 40 Units into the skin daily. 04/24/18   [provider]  JARDIANCE 25 MG TABS tablet Take 25 mg by mouth daily. 02/04/22   [provider]  LINZESS 145 MCG CAPS capsule Take 145 mcg by mouth daily. 03/18/22   [provider]  losartan (COZAAR) 100 MG tablet Take 100 mg by mouth daily. 05/13/22   [provider]  ondansetron (ZOFRAN) 4 MG tablet Take 4 mg by mouth every 8 (eight) hours as needed for nausea/vomiting. 05/13/22   [provider]  pantoprazole (PROTONIX) 40 MG tablet Take 40 mg by mouth daily. 08/08/20   [provider]  pantoprazole (PROTONIX) 40 MG tablet Take 1 tablet (40 mg total) by mouth daily. 10/07/23   Cathren Laine, MD  prochlorperazine (COMPAZINE) 10 MG tablet Take 1 tablet (10 mg total) by mouth 2 (two) times daily as needed for nausea or vomiting. 12/24/21   Arthor Captain, PA-C  sucralfate (CARAFATE) 1 g tablet Take 1 tablet (1 g total) by mouth 4 (four) times daily -  with meals and at bedtime. 10/04/22   Derwood Kaplan, MD  tiZANidine (ZANAFLEX) 4 MG tablet Take 4 mg by mouth 3 (three) times daily. 01/29/22   [provider]      Allergies    Metformin and related and Reglan [metoclopramide]  Review of Systems   Review of Systems  Physical Exam Updated Vital Signs BP (!) 153/92 (BP Location: Left Arm)   Pulse 91   Temp 98.6 F (37 C) (Oral)   Resp 18   SpO2 96%  Physical Exam Vitals and nursing note reviewed.  Constitutional:      Appearance: He is well-developed.  HENT:     Head: Normocephalic and atraumatic.  Eyes:     Pupils: Pupils are equal, round, and reactive to light.  Neck:     Vascular: No JVD.  Cardiovascular:     Rate and Rhythm: Normal rate and regular rhythm.     Heart sounds: No murmur heard.    No friction rub. No gallop.  Pulmonary:     Effort: No respiratory distress.     Breath sounds: No  wheezing.  Abdominal:     General: There is no distension.     Tenderness: There is abdominal tenderness. There is no guarding or rebound.     Comments: Pain worse to the epigastrium.  Diffuse pain about the upper abdomen.  Musculoskeletal:        General: Normal range of motion.     Cervical back: Normal range of motion and neck supple.  Skin:    Coloration: Skin is not pale.     Findings: No rash.  Neurological:     Mental Status: He is alert and oriented to person, place, and time.  Psychiatric:        Behavior: Behavior normal.     ED Results / Procedures / Treatments   Labs (all labs ordered are listed, but only abnormal results are displayed) Labs Reviewed  CBC WITH DIFFERENTIAL/PLATELET - Abnormal; Notable for the following components:      Result Value   HCT 38.1 (*)    Neutro Abs 8.5 (*)    All other components within normal limits  COMPREHENSIVE METABOLIC PANEL - Abnormal; Notable for the following components:   Sodium 133 (*)    Chloride 97 (*)    Glucose, Bld 155 (*)    BUN 25 (*)    Creatinine, Ser 1.76 (*)    Albumin 3.4 (*)    GFR, Estimated 44 (*)    All other components within normal limits  LIPASE, BLOOD    EKG None  Radiology No results found.  Procedures Procedures    Medications Ordered in ED Medications  sodium chloride 0.9 % bolus 1,000 mL (1,000 mLs Intravenous New Bag/Given 11/08/23 1135)  droperidol (INAPSINE) 2.5 MG/ML injection 1.25 mg (1.25 mg Intravenous Given 11/08/23 1127)  diphenhydrAMINE (BENADRYL) injection 25 mg (25 mg Intravenous Given 11/08/23 1130)  morphine (PF) 4 MG/ML injection 4 mg (4 mg Intravenous Given 11/08/23 1131)  alum & mag hydroxide-simeth (MAALOX/MYLANTA) 200-200-20 MG/5ML suspension 30 mL (30 mLs Oral Given 11/08/23 1134)    ED Course/ Medical Decision Making/ A&P                                 Medical Decision Making Amount and/or Complexity of Data Reviewed Labs: ordered.  Risk OTC  drugs. Prescription drug management.   57 yo M with a cc of upper abdominal pain.  Going on for about a week.  Tells me his had persistent nausea and vomiting.  Will check lab work bolus of IV fluids for pain and nausea reassess.  Patient with a very mild worsening of his creatinine.  No  significant LFT elevation lipase is normal.  No leukocytosis no acute anemia.  Patient feeling much better on repeat assessment tolerating by mouth without issue.  Will discharge home.  PCP follow-up.  12:42 PM:  I have discussed the diagnosis/risks/treatment options with the patient.  Evaluation and diagnostic testing in the emergency department does not suggest an emergent condition requiring admission or immediate intervention beyond what has been performed at this time.  They will follow up with PCP. We also discussed returning to the ED immediately if new or worsening sx occur. We discussed the sx which are most concerning (e.g., sudden worsening pain, fever, inability to tolerate by mouth) that necessitate immediate return. Medications administered to the patient during their visit and any new prescriptions provided to the patient are listed below.  Medications given during this visit Medications  sodium chloride 0.9 % bolus 1,000 mL (1,000 mLs Intravenous New Bag/Given 11/08/23 1135)  droperidol (INAPSINE) 2.5 MG/ML injection 1.25 mg (1.25 mg Intravenous Given 11/08/23 1127)  diphenhydrAMINE (BENADRYL) injection 25 mg (25 mg Intravenous Given 11/08/23 1130)  morphine (PF) 4 MG/ML injection 4 mg (4 mg Intravenous Given 11/08/23 1131)  alum & mag hydroxide-simeth (MAALOX/MYLANTA) 200-200-20 MG/5ML suspension 30 mL (30 mLs Oral Given 11/08/23 1134)     The patient appears reasonably screen and/or stabilized for discharge and I doubt any other medical condition or other [facility] requiring further screening, evaluation, or treatment in the ED at this time prior to discharge.          Final Clinical  Impression(s) / ED Diagnoses Final diagnoses:  Nausea and vomiting, unspecified vomiting type  Epigastric pain    Rx / DC Orders ED Discharge Orders          Ordered    ondansetron (ZOFRAN-ODT) 4 MG disintegrating tablet        11/08/23 1241    promethazine (PHENERGAN) 25 MG suppository  Every 6 hours PRN        11/08/23 1241              Melene Plan, DO 11/08/23 1242

## 2023-11-08 NOTE — ED Triage Notes (Signed)
Pt BIB [facility] from home for Abd pain/constipation w/NV x 1.5 weeks.   Poor PO intake d/t NV.  Tried Miralax without improvement. Experienced this a few months ago. Also complains of non-radiating left side CP  en route.  Hx of DM and GERD per [facility]  CBG 168 VSS.

## 2024-02-20 ENCOUNTER — Emergency Department (HOSPITAL_COMMUNITY)
Admission: EM | Admit: 2024-02-20 | Discharge: 2024-02-20 | Disposition: A | Discharge status: Home / Self Care | Attending: Emergency Medicine | Admitting: Emergency Medicine

## 2024-02-20 ENCOUNTER — Encounter (HOSPITAL_COMMUNITY): Payer: Self-pay

## 2024-02-20 ENCOUNTER — Other Ambulatory Visit: Payer: Self-pay

## 2024-02-20 ENCOUNTER — Emergency Department (HOSPITAL_COMMUNITY)

## 2024-02-20 DIAGNOSIS — E1142 Type 2 diabetes mellitus with diabetic polyneuropathy: Secondary | ICD-10-CM | POA: Diagnosis not present

## 2024-02-20 DIAGNOSIS — Z79899 Other long term (current) drug therapy: Secondary | ICD-10-CM | POA: Diagnosis not present

## 2024-02-20 DIAGNOSIS — R1084 Generalized abdominal pain: Secondary | ICD-10-CM | POA: Diagnosis present

## 2024-02-20 DIAGNOSIS — Z7984 Long term (current) use of oral hypoglycemic drugs: Secondary | ICD-10-CM | POA: Insufficient documentation

## 2024-02-20 DIAGNOSIS — K5900 Constipation, unspecified: Secondary | ICD-10-CM | POA: Diagnosis not present

## 2024-02-20 DIAGNOSIS — E1143 Type 2 diabetes mellitus with diabetic autonomic (poly)neuropathy: Secondary | ICD-10-CM | POA: Insufficient documentation

## 2024-02-20 DIAGNOSIS — I129 Hypertensive chronic kidney disease with stage 1 through stage 4 chronic kidney disease, or unspecified chronic kidney disease: Secondary | ICD-10-CM | POA: Insufficient documentation

## 2024-02-20 DIAGNOSIS — Z794 Long term (current) use of insulin: Secondary | ICD-10-CM | POA: Insufficient documentation

## 2024-02-20 DIAGNOSIS — N189 Chronic kidney disease, unspecified: Secondary | ICD-10-CM | POA: Diagnosis not present

## 2024-02-20 DIAGNOSIS — R339 Retention of urine, unspecified: Secondary | ICD-10-CM | POA: Diagnosis not present

## 2024-02-20 LAB — COMPREHENSIVE METABOLIC PANEL WITH GFR
ALT: 11 U/L (ref 0–44)
AST: 13 U/L — ABNORMAL LOW (ref 15–41)
Albumin: 3.6 g/dL (ref 3.5–5.0)
Alkaline Phosphatase: 64 U/L (ref 38–126)
Anion gap: 12 (ref 5–15)
BUN: 26 mg/dL — ABNORMAL HIGH (ref 6–20)
CO2: 20 mmol/L — ABNORMAL LOW (ref 22–32)
Calcium: 9.5 mg/dL (ref 8.9–10.3)
Chloride: 101 mmol/L (ref 98–111)
Creatinine, Ser: 1.85 mg/dL — ABNORMAL HIGH (ref 0.61–1.24)
GFR, Estimated: 41 mL/min — ABNORMAL LOW (ref >60)
Glucose, Bld: 247 mg/dL — ABNORMAL HIGH (ref 70–99)
Potassium: 4.3 mmol/L (ref 3.5–5.1)
Sodium: 133 mmol/L — ABNORMAL LOW (ref 135–145)
Total Bilirubin: 0.9 mg/dL (ref 0.0–1.2)
Total Protein: 6.9 g/dL (ref 6.5–8.1)

## 2024-02-20 LAB — URINALYSIS, W/ REFLEX TO CULTURE (INFECTION SUSPECTED)
Bilirubin Urine: NEGATIVE
Glucose, UA: >=500 mg/dL — AB
Ketones, ur: NEGATIVE mg/dL
Leukocytes,Ua: NEGATIVE
Nitrite: NEGATIVE
Protein, ur: >=300 mg/dL — AB
Specific Gravity, Urine: 1.015 (ref 1.005–1.030)
pH: 5 (ref 5.0–8.0)

## 2024-02-20 LAB — CBC WITH DIFFERENTIAL/PLATELET
Abs Immature Granulocytes: 0.05 10*3/uL (ref 0.00–0.07)
Basophils Absolute: 0 10*3/uL (ref 0.0–0.1)
Basophils Relative: 0 %
Eosinophils Absolute: 0.2 10*3/uL (ref 0.0–0.5)
Eosinophils Relative: 2 %
HCT: 35.2 % — ABNORMAL LOW (ref 39.0–52.0)
Hemoglobin: 11.7 g/dL — ABNORMAL LOW (ref 13.0–17.0)
Immature Granulocytes: 1 %
Lymphocytes Relative: 18 %
Lymphs Abs: 1.3 10*3/uL (ref 0.7–4.0)
MCH: 28.1 pg (ref 26.0–34.0)
MCHC: 33.2 g/dL (ref 30.0–36.0)
MCV: 84.6 fL (ref 80.0–100.0)
Monocytes Absolute: 0.5 10*3/uL (ref 0.1–1.0)
Monocytes Relative: 6 %
Neutro Abs: 5.2 10*3/uL (ref 1.7–7.7)
Neutrophils Relative %: 73 %
Platelets: 249 10*3/uL (ref 150–400)
RBC: 4.16 MIL/uL — ABNORMAL LOW (ref 4.22–5.81)
RDW: 12.7 % (ref 11.5–15.5)
WBC: 7.2 10*3/uL (ref 4.0–10.5)
nRBC: 0 % (ref 0.0–0.2)

## 2024-02-20 LAB — LIPASE, BLOOD: Lipase: 24 U/L (ref 11–51)

## 2024-02-20 LAB — I-STAT CG4 LACTIC ACID, ED
Lactic Acid, Venous: 1.7 mmol/L (ref 0.5–1.9)
Lactic Acid, Venous: 1 mmol/L (ref 0.5–1.9)

## 2024-02-20 MED ORDER — IOHEXOL 350 MG/ML SOLN
75.0000 mL | Freq: Once | INTRAVENOUS | Status: AC | PRN
  Administered 2024-02-20: 75 mL via INTRAVENOUS

## 2024-02-20 MED ORDER — FLEET ENEMA RE ENEM
1.0000 | ENEMA | Freq: Once | RECTAL | Status: AC
Start: 2024-02-20 — End: 2024-02-20
  Administered 2024-02-20: 1 via RECTAL
  Filled 2024-02-20: qty 1

## 2024-02-20 MED ORDER — MORPHINE SULFATE (PF) 4 MG/ML IV SOLN
4.0000 mg | Freq: Once | INTRAVENOUS | Status: AC
  Administered 2024-02-20: 4 mg via INTRAVENOUS
  Filled 2024-02-20: qty 1

## 2024-02-20 MED ORDER — SODIUM CHLORIDE 0.9 % IV BOLUS
500.0000 mL | Freq: Once | INTRAVENOUS | Status: AC
  Administered 2024-02-20: 500 mL via INTRAVENOUS

## 2024-02-20 NOTE — ED Notes (Signed)
 Patient transported to CT

## 2024-02-20 NOTE — Discharge Instructions (Signed)
 Your history, exam, workup today revealed urinary retention for which we treated you with a catheter.  The CT scan did not show obstruction and showed a large stool burden.  After the enema you were able to have a large bowel movement and we feel you are safe for discharge home.  Please increase your hydration and consider taking over-the-counter MiraLAX to help with your bowels.  Your urinalysis did not show UTI but you need to follow-up with urology as we are sending you home with the catheter.  Please rest and stay hydrated and follow-up with your primary doctor as well.

## 2024-02-20 NOTE — ED Notes (Signed)
 AVS provided to and discussed with patient. Pt verbalizes understanding of discharge instructions and denies any questions or concerns at this time. Pt has ride home. Pt ambulated out of department independently with steady gait.

## 2024-02-20 NOTE — ED Notes (Signed)
Pt ambulated to restroom to have a BM  

## 2024-02-20 NOTE — ED Provider Notes (Addendum)
 [place] EMERGENCY DEPARTMENT AT [facility] Provider Note   CSN: [id number] Arrival date & time: 02/20/24  0932     History  Chief Complaint  Patient presents with   Abdominal Pain    James Chang is a 58 y.o. male.  The history is provided by the patient. No language interpreter was used.  Abdominal Pain Pain location:  Generalized Pain quality: aching, bloating and fullness   Pain radiates to:  Back Pain severity:  Severe Onset quality:  Gradual Duration:  3 days Timing:  Constant Progression:  Worsening Chronicity:  New Context: not trauma   Worsened by:  Palpation Ineffective treatments:  None tried Associated symptoms: constipation   Associated symptoms: no chest pain, no chills, no cough, no diarrhea, no dysuria, no fatigue, no fever, no flatus, no nausea, no shortness of breath and no vomiting        Home Medications Prior to Admission medications   Medication Sig Start Date End Date Taking? Authorizing Provider  ABILIFY MAINTENA 400 MG PRSY prefilled syringe Inject 1 each into the muscle every 30 (thirty) days. 03/18/22   [provider]  alum & mag hydroxide-simeth (MAALOX PLUS) 400-400-40 MG/5ML suspension Take 15 mLs by mouth every 6 (six) hours as needed for indigestion. 05/28/22   Kommor, Madison, MD  atorvastatin (LIPITOR) 40 MG tablet Take 40 mg by mouth daily.    [provider]  busPIRone (BUSPAR) 15 MG tablet Take 15 mg by mouth 2 (two) times daily. 03/18/22   [provider]  diclofenac Sodium (VOLTAREN) 1 % GEL Apply 1 application. topically daily as needed (For pain). 03/18/22   [provider]  dicyclomine (BENTYL) 20 MG tablet Take 1 tablet (20 mg total) by mouth 2 (two) times daily. 05/28/22   Kommor, Madison, MD  DULoxetine (CYMBALTA) 20 MG capsule Take 20 mg by mouth daily. 01/16/22   [provider]  famotidine (PEPCID) 20 MG tablet Take 1 tablet (20 mg total) by mouth 2 (two) times daily.  05/28/22   Kommor, Madison, MD  gabapentin (NEURONTIN) 300 MG capsule Take 300 mg by mouth daily as needed (For back pain). 03/18/22   [provider]  insulin glargine (LANTUS SOLOSTAR) 100 UNIT/ML Solostar Pen Inject 40 Units into the skin daily. 04/24/18   [provider]  JARDIANCE 25 MG TABS tablet Take 25 mg by mouth daily. 02/04/22   [provider]  LINZESS 145 MCG CAPS capsule Take 145 mcg by mouth daily. 03/18/22   [provider]  losartan (COZAAR) 100 MG tablet Take 100 mg by mouth daily. 05/13/22   [provider]  ondansetron (ZOFRAN) 4 MG tablet Take 4 mg by mouth every 8 (eight) hours as needed for nausea/vomiting. 05/13/22   [provider]  ondansetron (ZOFRAN-ODT) 4 MG disintegrating tablet 4mg  ODT q4 hours prn nausea/vomit 11/08/23   Melene Plan, DO  pantoprazole (PROTONIX) 40 MG tablet Take 40 mg by mouth daily. 08/08/20   [provider]  pantoprazole (PROTONIX) 40 MG tablet Take 1 tablet (40 mg total) by mouth daily. 10/07/23   Cathren Laine, MD  prochlorperazine (COMPAZINE) 10 MG tablet Take 1 tablet (10 mg total) by mouth 2 (two) times daily as needed for nausea or vomiting. 12/24/21   Arthor Captain, PA-C  promethazine (PHENERGAN) 25 MG suppository Place 1 suppository (25 mg total) rectally every 6 (six) hours as needed for nausea or vomiting. 11/08/23   Melene Plan, DO  sucralfate (CARAFATE) 1 g  tablet Take 1 tablet (1 g total) by mouth 4 (four) times daily -  with meals and at bedtime. 10/04/22   Derwood Kaplan, MD  tiZANidine (ZANAFLEX) 4 MG tablet Take 4 mg by mouth 3 (three) times daily. 01/29/22   [provider]      Allergies    Metformin and related and Reglan [metoclopramide]    Review of Systems   Review of Systems  Constitutional:  Negative for chills, fatigue and fever.  HENT:  Negative for congestion.   Respiratory:  Negative for cough, chest tightness, shortness of breath, wheezing and stridor.    Cardiovascular:  Negative for chest pain, palpitations and leg swelling.  Gastrointestinal:  Positive for abdominal distention, abdominal pain and constipation. Negative for blood in stool, diarrhea, flatus, nausea and vomiting.  Genitourinary:  Positive for decreased urine volume. Negative for dysuria and frequency.  Musculoskeletal:  Positive for back pain. Negative for neck pain and neck stiffness.  Skin:  Negative for rash and wound.  Neurological:  Negative for headaches.  Psychiatric/Behavioral:  Negative for agitation and confusion.   All other systems reviewed and are negative.   Physical Exam Updated Vital Signs Ht 5\' 6"  (1.676 m)   Wt 78.9 kg   BMI 28.08 kg/m  Physical Exam Vitals and nursing note reviewed.  Constitutional:      General: He is not in acute distress.    Appearance: He is well-developed. He is not ill-appearing, toxic-appearing or diaphoretic.  HENT:     Head: Normocephalic and atraumatic.     Mouth/Throat:     Mouth: Mucous membranes are moist.  Eyes:     Conjunctiva/sclera: Conjunctivae normal.  Cardiovascular:     Rate and Rhythm: Normal rate and regular rhythm.     Heart sounds: No murmur heard. Pulmonary:     Effort: Pulmonary effort is normal. No respiratory distress.     Breath sounds: Normal breath sounds. No wheezing, rhonchi or rales.  Chest:     Chest wall: No tenderness.  Abdominal:     General: Bowel sounds are decreased. There is distension.     Tenderness: There is abdominal tenderness. There is no right CVA tenderness, left CVA tenderness, guarding or rebound.  Genitourinary:    Comments: Deferred by patient as denies groin complaints initially Musculoskeletal:        General: No swelling.     Cervical back: Neck supple.  Skin:    General: Skin is warm and dry.     Capillary Refill: Capillary refill takes less than 2 seconds.  Neurological:     Mental Status: He is alert.  Psychiatric:        Mood and Affect: Mood normal.      ED Results / Procedures / Treatments   Labs (all labs ordered are listed, but only abnormal results are displayed) Labs Reviewed  CBC WITH DIFFERENTIAL/PLATELET - Abnormal; Notable for the following components:      Result Value   RBC 4.16 (*)    Hemoglobin 11.7 (*)    HCT 35.2 (*)    All other components within normal limits  COMPREHENSIVE METABOLIC PANEL WITH GFR - Abnormal; Notable for the following components:   Sodium 133 (*)    CO2 20 (*)    Glucose, Bld 247 (*)    BUN 26 (*)    Creatinine, Ser 1.85 (*)    AST 13 (*)    GFR, Estimated 41 (*)    All other components within normal limits  URINALYSIS, W/ REFLEX TO CULTURE (INFECTION SUSPECTED) - Abnormal; Notable for the following components:   Glucose, UA >=500 (*)    Hgb urine dipstick SMALL (*)    Protein, ur >=300 (*)    Bacteria, UA RARE (*)    All other components within normal limits  URINE CULTURE  LIPASE, BLOOD  I-STAT CG4 LACTIC ACID, ED  I-STAT CG4 LACTIC ACID, ED    EKG None  Radiology CT ABDOMEN PELVIS W CONTRAST Result Date: 02/20/2024 CLINICAL DATA:  Bowel obstruction suspected, constipation for 1 month. Worsening pain and unable to urinate. EXAM: CT ABDOMEN AND PELVIS WITH CONTRAST TECHNIQUE: Multidetector CT imaging of the abdomen and pelvis was performed using the standard protocol following bolus administration of intravenous contrast. RADIATION DOSE REDUCTION: This exam was performed according to the departmental dose-optimization program which includes automated exposure control, adjustment of the mA and/or kV according to patient size and/or use of iterative reconstruction technique. CONTRAST:  75mL OMNIPAQUE IOHEXOL 350 MG/ML SOLN COMPARISON:  October 07, 2023 FINDINGS: Lower chest: No focal airspace consolidation or pleural effusion.Posterior bibasilar dependent atelectasis. Hepatobiliary: No mass.No radiopaque stones or wall thickening of the gallbladder.No intrahepatic or extrahepatic biliary  ductal dilation.The portal veins are patent. Pancreas: No mass or main ductal dilation.No peripancreatic inflammation or fluid collection. Spleen: Normal size. No mass. Adrenals/Urinary Tract: No adrenal masses. No renal mass. No nephrolithiasis or hydronephrosis. The urinary bladder is completely decompressed with a urinary catheter in place. Stomach/Bowel: The stomach contains ingested material without focal abnormality. No small bowel wall thickening or inflammation. No small bowel obstruction. Normal appendix. Large volume fecal loading throughout the entire colon. Fecal ball in the rectum measuring 8.1 x 7.2 x 10.3 cm. Vascular/Lymphatic: No aortic aneurysm. No intraabdominal or pelvic lymphadenopathy. Reproductive: No prostatomegaly.No free pelvic fluid. Other: No pneumoperitoneum, ascites, or mesenteric inflammation. Musculoskeletal: No acute fracture or destructive lesion.Multilevel degenerative disc disease of the spine. Small volume symmetric bilateral gynecomastia. IMPRESSION: 1. Large volume fecal loading throughout the entire colon, consistent with constipation. Fecal ball in the rectum measuring 8.1 x 7.2 x 10.3 cm, which could reflect fecal impaction. 2. No small bowel obstruction. 3. Well-positioned urinary catheter within the completely decompressed urinary bladder. Electronically Signed   By: Wallie Char M.D.   On: 02/20/2024 13:45    Procedures Procedures    Medications Ordered in ED Medications  morphine (PF) 4 MG/ML injection 4 mg (4 mg Intravenous Given 02/20/24 0958)  iohexol (OMNIPAQUE) 350 MG/ML injection 75 mL (75 mLs Intravenous Contrast Given 02/20/24 1219)  sodium phosphate (FLEET) enema 1 enema (1 enema Rectal Given 02/20/24 1556)  sodium chloride 0.9 % bolus 500 mL (500 mLs Intravenous New Bag/Given 02/20/24 1614)    ED Course/ Medical Decision Making/ A&P                                 Medical Decision Making Amount and/or Complexity of Data Reviewed Labs:  ordered. Radiology: ordered.  Risk OTC drugs. Prescription drug management.    James Chang is a 58 y.o. male with a past medical history significant for hypertension, hyperlipidemia, previous stroke, CKD, diabetes, schizoaffective disorder, previous diabetic gastroparesis, and peripheral neuropathies who presents with abdominal pain.  According to patient, he has not had a bowel movement in several days and his abdomen feels hard and distended.  He reports he has not urinated today but denied any dysuria or hematuria before he was unable  to urinate.  He denies any trauma.  Reports the pain is 10 out of 10 in severity.  Reports his abdomen feels very distended.  Reports the pain goes around towards his back.  He denies it being sharp.  Denies any skin changes or rashes.  Denies any groin or testicle symptoms.  Denies any leg pain or leg swelling.  Nuys other complaints peer denies any chest pain or shortness of breath.  Denies any fevers or chills.  On exam, lungs are clear and chest is nontender.  I did not appreciate a murmur.  Abdomen is quite tender and distended.  Bowel sounds were somewhat diminished.  CVA areas nontender initially.  Flanks nontender.  He denied any GU symptoms and we will defer a GU exam initially.   Clinically I am concerned about bowel obstruction versus bladder outlet obstruction causing bowel changes as well.  Will do a bladder scan but will get labs and give him some pain medicine.  Will order CT abdomen pelvis with contrast to rule out obstruction.  Was informed by nursing that the bladder scan revealed at least 725 of urine in the bladder.  Will place a Foley catheter for this bladder outlet obstruction.  This may let him have a bowel movement.  Will get CT scan to look for abnormalities.  Anticipate improvement in symptoms after Foley placement.  Foley cathete was placed and removed his discomfort.  CT scan returned showing large amount of stool and possible  impaction but no obstruction.  Urinalysis did not show UTI with no nitrites or leukocytes.  Creatinine similar to prior.  Lactic acid normal.  CBC shows no leukocytosis and mild anemia.  Lipase normal.  Given his resolution of symptoms, I suspect it was more urinary retention causing his symptoms.  Unclear if it was due to the constipation or if the urinary retention was causing the constipation.  Patient will try to have a bowel movement.  He attempted without any assistance and now would like to try an enema when we did offer that or disimpaction manually.  Will give an enema and if he is able to move his bowels, we will plan for discharge home with his Foley in place to follow-up with outpatient urology.  Patient agrees to this plan.   Patient had the enema and was able to have a large bowel movement.  He continues to feel well.  He would like to go home.  Patient we discharged with a Foley in place for urinary retention and bladder L obstruction and he will follow-up with urology.  He understood return precautions and to increase his hydration.  He had no questions or concerns and was discharged in good condition.   4:45 PM After planning a discharge with a leg bag and they Foley catheter in place, patient tells me he does not want to go home with a catheter.  He thinks that he will be able to urinate better now that he moved his bowels.  We discussed the risks of urinary retention happening again again leading to constipation and fecal impaction however he would like the catheter removed.  Patient reassured return precautions and follow-up instructions and was discharged after Foley catheter will be removed.      Final Clinical Impression(s) / ED Diagnoses Final diagnoses:  Generalized abdominal pain  Urinary retention  Constipation, unspecified constipation type    Rx / DC Orders ED Discharge Orders     None       Clinical  Impression: 1. Generalized abdominal pain   2.  Urinary retention   3. Constipation, unspecified constipation type     Disposition: Discharge  Condition: Good  I have discussed the results, Dx and Tx plan with the pt(& family if present). He/she/they expressed understanding and agree(s) with the plan. Discharge instructions discussed at great length. Strict return precautions discussed and pt &/or family have verbalized understanding of the instructions. No further questions at time of discharge.    New Prescriptions   No medications on file    Follow Up: [facility] [address] [place] [address] [phone]    Sharmon Revere, MD [address] [place] [address] [phone]         Oney Tatlock, Canary Brim, MD 02/20/24 1643      Khambrel Amsden, Canary Brim, MD 02/20/24 [phone]

## 2024-02-20 NOTE — ED Triage Notes (Signed)
 Pt to ED via [facility] from home. Pt has had constipation x 1 month, small BM 4 days ago. Pt states pain is worsening and is unable to urinate. Pt denies nausea and vomiting.   122/64 124 18 96% Cbg 260

## 2024-02-20 NOTE — ED Notes (Signed)
 Pt requested F/C be removed prior to discharge. Pt educated on purpose of foley catheter in the setting of urinary retention and risks of removing it. Pt verbalized understanding and continues to want it removed. Dr. Rush Landmark spoke with patient as well, and F/C removed. Pt tolerated well.

## 2024-02-21 LAB — URINE CULTURE: Culture: NO GROWTH

## 2024-06-22 ENCOUNTER — Emergency Department (HOSPITAL_COMMUNITY)

## 2024-06-22 ENCOUNTER — Emergency Department (HOSPITAL_COMMUNITY)
Admission: EM | Admit: 2024-06-22 | Discharge: 2024-06-22 | Disposition: A | Discharge status: Home / Self Care | Attending: Emergency Medicine | Admitting: Emergency Medicine

## 2024-06-22 ENCOUNTER — Other Ambulatory Visit: Payer: Self-pay

## 2024-06-22 DIAGNOSIS — Z794 Long term (current) use of insulin: Secondary | ICD-10-CM | POA: Diagnosis not present

## 2024-06-22 DIAGNOSIS — E1122 Type 2 diabetes mellitus with diabetic chronic kidney disease: Secondary | ICD-10-CM | POA: Diagnosis not present

## 2024-06-22 DIAGNOSIS — I129 Hypertensive chronic kidney disease with stage 1 through stage 4 chronic kidney disease, or unspecified chronic kidney disease: Secondary | ICD-10-CM | POA: Insufficient documentation

## 2024-06-22 DIAGNOSIS — N189 Chronic kidney disease, unspecified: Secondary | ICD-10-CM | POA: Diagnosis not present

## 2024-06-22 DIAGNOSIS — Z79899 Other long term (current) drug therapy: Secondary | ICD-10-CM | POA: Insufficient documentation

## 2024-06-22 DIAGNOSIS — K5900 Constipation, unspecified: Secondary | ICD-10-CM | POA: Diagnosis present

## 2024-06-22 LAB — CBC WITH DIFFERENTIAL/PLATELET
Abs Immature Granulocytes: 0.02 K/uL (ref 0.00–0.07)
Basophils Absolute: 0 K/uL (ref 0.0–0.1)
Basophils Relative: 0 %
Eosinophils Absolute: 0.1 K/uL (ref 0.0–0.5)
Eosinophils Relative: 1 %
HCT: 37 % — ABNORMAL LOW (ref 39.0–52.0)
Hemoglobin: 12.3 g/dL — ABNORMAL LOW (ref 13.0–17.0)
Immature Granulocytes: 0 %
Lymphocytes Relative: 13 %
Lymphs Abs: 1.1 K/uL (ref 0.7–4.0)
MCH: 27.8 pg (ref 26.0–34.0)
MCHC: 33.2 g/dL (ref 30.0–36.0)
MCV: 83.5 fL (ref 80.0–100.0)
Monocytes Absolute: 0.4 K/uL (ref 0.1–1.0)
Monocytes Relative: 5 %
Neutro Abs: 6.5 K/uL (ref 1.7–7.7)
Neutrophils Relative %: 81 %
Platelets: 266 K/uL (ref 150–400)
RBC: 4.43 MIL/uL (ref 4.22–5.81)
RDW: 13.2 % (ref 11.5–15.5)
WBC: 8.1 K/uL (ref 4.0–10.5)
nRBC: 0 % (ref 0.0–0.2)

## 2024-06-22 LAB — COMPREHENSIVE METABOLIC PANEL WITH GFR
ALT: 15 U/L (ref 0–44)
AST: 15 U/L (ref 15–41)
Albumin: 3.3 g/dL — ABNORMAL LOW (ref 3.5–5.0)
Alkaline Phosphatase: 63 U/L (ref 38–126)
Anion gap: 8 (ref 5–15)
BUN: 18 mg/dL (ref 6–20)
CO2: 25 mmol/L (ref 22–32)
Calcium: 9.2 mg/dL (ref 8.9–10.3)
Chloride: 108 mmol/L (ref 98–111)
Creatinine, Ser: 1.77 mg/dL — ABNORMAL HIGH (ref 0.61–1.24)
GFR, Estimated: 44 mL/min — ABNORMAL LOW (ref >60)
Glucose, Bld: 191 mg/dL — ABNORMAL HIGH (ref 70–99)
Potassium: 4.7 mmol/L (ref 3.5–5.1)
Sodium: 141 mmol/L (ref 135–145)
Total Bilirubin: 1.1 mg/dL (ref 0.0–1.2)
Total Protein: 6.4 g/dL — ABNORMAL LOW (ref 6.5–8.1)

## 2024-06-22 LAB — URINALYSIS, W/ REFLEX TO CULTURE (INFECTION SUSPECTED)
Bacteria, UA: NONE SEEN
Bilirubin Urine: NEGATIVE
Glucose, UA: 150 mg/dL — AB
Hgb urine dipstick: NEGATIVE
Ketones, ur: NEGATIVE mg/dL
Leukocytes,Ua: NEGATIVE
Nitrite: NEGATIVE
Protein, ur: >=300 mg/dL — AB
Specific Gravity, Urine: 1.013 (ref 1.005–1.030)
pH: 7 (ref 5.0–8.0)

## 2024-06-22 NOTE — ED Triage Notes (Signed)
 BIB [facility] from home. Suprapubic pain, urine rentention. Dysuria, N/V No BM in a month Weakness but ambulatory

## 2024-06-22 NOTE — Discharge Instructions (Signed)
 Continue with your MiraLAX , however you may need to take it more frequently.  I would take it twice a day until you feels that things have cleaned out a little more.  Return for more difficulty urinating or increasing abdominal pain.

## 2024-06-22 NOTE — ED Provider Notes (Signed)
 [place] EMERGENCY DEPARTMENT AT [facility] Provider Note   CSN: [id number] Arrival date & time: 06/22/24  8084     Patient presents with: Dysuria and Constipation   James Chang is a 58 y.o. male.    Dysuria Presenting symptoms: dysuria   Constipation Associated symptoms: dysuria   Patient presents with some difficulty urinating.  Suprapubic pain.  States not had much bowel movement.  Said about a month since a good bowel movement.  Has been taking MiraLAX  at home but just 1 capful every 2 to 3 days. Reviewing notes it appears he has seen GI previously for chronic idiopathic constipation.   Past Medical History:  Diagnosis Date   Abdominal pain    CKD (chronic kidney disease), stage III (HCC)    Depression    Diabetes mellitus without complication (HCC)    History of migraine    Hyperlipidemia    Hypertension    Personal history of drug abuse (HCC)    Stroke (HCC) 11/11/2006    Prior to Admission medications   Medication Sig Start Date End Date Taking? Authorizing Provider  ABILIFY MAINTENA 400 MG PRSY prefilled syringe Inject 1 each into the muscle every 30 (thirty) days. 03/18/22   [provider]  alum & mag hydroxide-simeth (MAALOX PLUS) 400-400-40 MG/5ML suspension Take 15 mLs by mouth every 6 (six) hours as needed for indigestion. 05/28/22   Kommor, Madison, MD  atorvastatin  (LIPITOR) 40 MG tablet Take 40 mg by mouth daily.    [provider]  busPIRone (BUSPAR) 15 MG tablet Take 15 mg by mouth 2 (two) times daily. 03/18/22   [provider]  diclofenac Sodium (VOLTAREN) 1 % GEL Apply 1 application. topically daily as needed (For pain). 03/18/22   [provider]  dicyclomine  (BENTYL ) 20 MG tablet Take 1 tablet (20 mg total) by mouth 2 (two) times daily. 05/28/22   Kommor, Madison, MD  DULoxetine (CYMBALTA) 20 MG capsule Take 20 mg by mouth daily. 01/16/22   [provider]  famotidine  (PEPCID ) 20 MG tablet Take 1  tablet (20 mg total) by mouth 2 (two) times daily. 05/28/22   Kommor, Madison, MD  gabapentin  (NEURONTIN ) 300 MG capsule Take 300 mg by mouth daily as needed (For back pain). 03/18/22   [provider]  insulin  glargine (LANTUS  SOLOSTAR) 100 UNIT/ML Solostar Pen Inject 40 Units into the skin daily. 04/24/18   [provider]  JARDIANCE 25 MG TABS tablet Take 25 mg by mouth daily. 02/04/22   [provider]  LINZESS 145 MCG CAPS capsule Take 145 mcg by mouth daily. 03/18/22   [provider]  losartan  (COZAAR ) 100 MG tablet Take 100 mg by mouth daily. 05/13/22   [provider]  ondansetron  (ZOFRAN ) 4 MG tablet Take 4 mg by mouth every 8 (eight) hours as needed for nausea/vomiting. 05/13/22   [provider]  ondansetron  (ZOFRAN -ODT) 4 MG disintegrating tablet 4mg  ODT q4 hours prn nausea/vomit 11/08/23   Floyd, Dan, DO  pantoprazole  (PROTONIX ) 40 MG tablet Take 40 mg by mouth daily. 08/08/20   [provider]  pantoprazole  (PROTONIX ) 40 MG tablet Take 1 tablet (40 mg total) by mouth daily. 10/07/23   Bernard Franky, MD  prochlorperazine  (COMPAZINE ) 10 MG tablet Take 1 tablet (10 mg total) by mouth 2 (two) times daily as needed for nausea or vomiting. 12/24/21   Harris, Abigail, PA-C  promethazine  (PHENERGAN ) 25 MG suppository Place 1 suppository (25 mg total) rectally every 6 (  six) hours as needed for nausea or vomiting. 11/08/23   Emil Share, DO  sucralfate  (CARAFATE ) 1 g tablet Take 1 tablet (1 g total) by mouth 4 (four) times daily -  with meals and at bedtime. 10/04/22   Nanavati, Ankit, MD  tiZANidine (ZANAFLEX) 4 MG tablet Take 4 mg by mouth 3 (three) times daily. 01/29/22   [provider]    Allergies: Metformin  and related and Reglan  [metoclopramide ]    Review of Systems  Gastrointestinal:  Positive for constipation.  Genitourinary:  Positive for dysuria.    Updated Vital Signs BP (!) 177/80   Pulse 81   Temp 99 F (37.2 C)  (Oral)   Resp 18   SpO2 100%   Physical Exam Vitals and nursing note reviewed.  Abdominal:     Comments: Mild diffuse tenderness no rebound or guarding.  No hernia palpated.  Skin:    General: Skin is warm.  Neurological:     Mental Status: He is alert.     (all labs ordered are listed, but only abnormal results are displayed) Labs Reviewed  COMPREHENSIVE METABOLIC PANEL WITH GFR - Abnormal; Notable for the following components:      Result Value   Glucose, Bld 191 (*)    Creatinine, Ser 1.77 (*)    Total Protein 6.4 (*)    Albumin 3.3 (*)    GFR, Estimated 44 (*)    All other components within normal limits  CBC WITH DIFFERENTIAL/PLATELET - Abnormal; Notable for the following components:   Hemoglobin 12.3 (*)    HCT 37.0 (*)    All other components within normal limits  URINALYSIS, W/ REFLEX TO CULTURE (INFECTION SUSPECTED)    EKG: None  Radiology: DG Abd 2 Views Result Date: 06/22/2024 CLINICAL DATA:  Dysuria and suprapubic pain with urinary retention. EXAM: ABDOMEN - 2 VIEW COMPARISON:  Apr 05, 2022 FINDINGS: The bowel gas pattern is normal. A large stool burden is noted. There is no evidence of free air. No radio-opaque calculi or other significant radiographic abnormality is seen. IMPRESSION: Large stool burden without evidence of bowel obstruction. Electronically Signed   By: Suzen Dials M.D.   On: 06/22/2024 20:42     Procedures   Medications Ordered in the ED - No data to display                                  Medical Decision Making Amount and/or Complexity of Data Reviewed Labs: ordered. Radiology: ordered.   Patient with constipation.  Potential urinary retention also.  Had been seen a few months ago for same with CT scan at that time.  Differential diagnosis does include obstruction, constipation.  However patient states he did have a bowel movement here and is feeling somewhat better.  States he feels that he can urinate now.  X-ray done  and showed some constipation without obstruction.  Also blood work reassuring.  Creatinine mildly elevated but appears to be at his baseline.  Patient feels better after bowel movement.  Does not have large postvoid residual.  Able to urinate.  Will discharge home and will increase his MiraLAX .  Can follow-up with PCP and GI.       Final diagnoses:  Constipation, unspecified constipation type    ED Discharge Orders     None          Patsey Lot, MD 06/22/24 2310

## 2024-06-26 ENCOUNTER — Encounter (HOSPITAL_COMMUNITY): Payer: Self-pay

## 2024-06-26 ENCOUNTER — Other Ambulatory Visit: Payer: Self-pay

## 2024-06-26 ENCOUNTER — Emergency Department (HOSPITAL_COMMUNITY)
Admission: EM | Admit: 2024-06-26 | Discharge: 2024-06-26 | Disposition: A | Discharge status: Home / Self Care | Attending: Emergency Medicine | Admitting: Emergency Medicine

## 2024-06-26 ENCOUNTER — Emergency Department (HOSPITAL_COMMUNITY)
Admission: EM | Admit: 2024-06-26 | Discharge: 2024-06-26 | Discharge status: Against Medical Advice | Attending: Emergency Medicine | Admitting: Emergency Medicine

## 2024-06-26 ENCOUNTER — Emergency Department (HOSPITAL_COMMUNITY)

## 2024-06-26 DIAGNOSIS — R1110 Vomiting, unspecified: Secondary | ICD-10-CM | POA: Diagnosis present

## 2024-06-26 DIAGNOSIS — Z5321 Procedure and treatment not carried out due to patient leaving prior to being seen by health care provider: Secondary | ICD-10-CM | POA: Diagnosis not present

## 2024-06-26 DIAGNOSIS — R1013 Epigastric pain: Secondary | ICD-10-CM

## 2024-06-26 DIAGNOSIS — E119 Type 2 diabetes mellitus without complications: Secondary | ICD-10-CM | POA: Diagnosis not present

## 2024-06-26 DIAGNOSIS — Z794 Long term (current) use of insulin: Secondary | ICD-10-CM | POA: Insufficient documentation

## 2024-06-26 DIAGNOSIS — R1084 Generalized abdominal pain: Secondary | ICD-10-CM | POA: Diagnosis present

## 2024-06-26 LAB — URINALYSIS, ROUTINE W REFLEX MICROSCOPIC
Bacteria, UA: NONE SEEN
Bilirubin Urine: NEGATIVE
Glucose, UA: >=500 mg/dL — AB
Ketones, ur: 5 mg/dL — AB
Leukocytes,Ua: NEGATIVE
Nitrite: NEGATIVE
Protein, ur: >=300 mg/dL — AB
Specific Gravity, Urine: 1.012 (ref 1.005–1.030)
pH: 7 (ref 5.0–8.0)

## 2024-06-26 LAB — COMPREHENSIVE METABOLIC PANEL WITH GFR
ALT: 14 U/L (ref 0–44)
AST: 14 U/L — ABNORMAL LOW (ref 15–41)
Albumin: 3.4 g/dL — ABNORMAL LOW (ref 3.5–5.0)
Alkaline Phosphatase: 67 U/L (ref 38–126)
Anion gap: 10 (ref 5–15)
BUN: 15 mg/dL (ref 6–20)
CO2: 25 mmol/L (ref 22–32)
Calcium: 9.2 mg/dL (ref 8.9–10.3)
Chloride: 105 mmol/L (ref 98–111)
Creatinine, Ser: 1.81 mg/dL — ABNORMAL HIGH (ref 0.61–1.24)
GFR, Estimated: 43 mL/min — ABNORMAL LOW (ref >60)
Glucose, Bld: 217 mg/dL — ABNORMAL HIGH (ref 70–99)
Potassium: 3.7 mmol/L (ref 3.5–5.1)
Sodium: 140 mmol/L (ref 135–145)
Total Bilirubin: 1.1 mg/dL (ref 0.0–1.2)
Total Protein: 6.6 g/dL (ref 6.5–8.1)

## 2024-06-26 LAB — CBC
HCT: 34.6 % — ABNORMAL LOW (ref 39.0–52.0)
Hemoglobin: 11.6 g/dL — ABNORMAL LOW (ref 13.0–17.0)
MCH: 27.8 pg (ref 26.0–34.0)
MCHC: 33.5 g/dL (ref 30.0–36.0)
MCV: 82.8 fL (ref 80.0–100.0)
Platelets: 276 K/uL (ref 150–400)
RBC: 4.18 MIL/uL — ABNORMAL LOW (ref 4.22–5.81)
RDW: 13 % (ref 11.5–15.5)
WBC: 8.5 K/uL (ref 4.0–10.5)
nRBC: 0 % (ref 0.0–0.2)

## 2024-06-26 LAB — LIPASE, BLOOD: Lipase: 23 U/L (ref 11–51)

## 2024-06-26 MED ORDER — ONDANSETRON HCL 4 MG/2ML IJ SOLN
4.0000 mg | Freq: Once | INTRAMUSCULAR | Status: AC
  Administered 2024-06-26: 4 mg via INTRAVENOUS
  Filled 2024-06-26: qty 2

## 2024-06-26 MED ORDER — MORPHINE SULFATE (PF) 4 MG/ML IV SOLN
4.0000 mg | Freq: Once | INTRAVENOUS | Status: AC
  Administered 2024-06-26: 4 mg via INTRAVENOUS
  Filled 2024-06-26: qty 1

## 2024-06-26 MED ORDER — LOSARTAN POTASSIUM 50 MG PO TABS
50.0000 mg | ORAL_TABLET | Freq: Once | ORAL | Status: AC
  Administered 2024-06-26: 50 mg via ORAL
  Filled 2024-06-26: qty 1

## 2024-06-26 MED ORDER — PANTOPRAZOLE SODIUM 40 MG PO TBEC: 40.0000 mg | DELAYED_RELEASE_TABLET | Freq: Every day | ORAL | 0 refills | Status: AC

## 2024-06-26 MED ORDER — SODIUM CHLORIDE 0.9 % IV BOLUS
500.0000 mL | Freq: Once | INTRAVENOUS | Status: AC
  Administered 2024-06-26: 500 mL via INTRAVENOUS

## 2024-06-26 MED ORDER — SUCRALFATE 1 G PO TABS
1.0000 g | ORAL_TABLET | Freq: Three times a day (TID) | ORAL | 0 refills | Status: AC
Start: 2024-06-26 — End: 2024-07-03

## 2024-06-26 MED ORDER — ALUM & MAG HYDROXIDE-SIMETH 200-200-20 MG/5ML PO SUSP
30.0000 mL | Freq: Once | ORAL | Status: AC
  Administered 2024-06-26: 30 mL via ORAL
  Filled 2024-06-26: qty 30

## 2024-06-26 MED ORDER — DROPERIDOL 2.5 MG/ML IJ SOLN
1.2500 mg | Freq: Once | INTRAMUSCULAR | Status: AC
  Administered 2024-06-26: 1.25 mg via INTRAVENOUS
  Filled 2024-06-26: qty 2

## 2024-06-26 MED ORDER — ONDANSETRON 4 MG PO TBDP
4.0000 mg | ORAL_TABLET | Freq: Once | ORAL | Status: AC
  Administered 2024-06-26: 4 mg via ORAL
  Filled 2024-06-26: qty 1

## 2024-06-26 NOTE — ED Notes (Signed)
 Pt given ginger ale and crackers to po challenge

## 2024-06-26 NOTE — Discharge Instructions (Signed)
 It was a pleasure caring for you today in the emergency department.  Recommend bland diet over the next 2 weeks, avoid tobacco, alcohol, NSAIDs, greasy or fried foods.  If you are still having symptoms in 2 weeks please follow-up with gastroenterology  Please return to the emergency department for any worsening or worrisome symptoms.

## 2024-06-26 NOTE — ED Notes (Signed)
 PT provided bus pass and d/c papers reviewed. Pt expressed understanding.

## 2024-06-26 NOTE — ED Provider Notes (Signed)
 [place] EMERGENCY DEPARTMENT AT [facility] Provider Note   CSN: [id number] Arrival date & time: 06/26/24  9782     Patient presents with: Abdominal Pain   James Chang is a 58 y.o. male.   The history is provided by the patient and medical records.  Abdominal Pain James Chang is a 58 y.o. male who presents to the Emergency Department complaining of abdominal pain. He presents to the emergency apartment for evaluation of generalized abdominal pain that he describes as a hole in his stomach that started yesterday. Pain is constant and severe in nature. Today he developed numerous episodes of emesis. No fevers, chest pain, diarrhea, constipation, dysuria. No prior similar symptoms. He did recently get evaluated for constipation and states that that is now resolved. He did smoke crack cocaine yesterday but states the pain was present prior to using crack. No tobacco or alcohol. No prior abdominal surgeries.     Prior to Admission medications   Medication Sig Start Date End Date Taking? Authorizing Provider  ABILIFY MAINTENA 400 MG PRSY prefilled syringe Inject 1 each into the muscle every 30 (thirty) days. 03/18/22   [provider]  alum & mag hydroxide-simeth (MAALOX PLUS) 400-400-40 MG/5ML suspension Take 15 mLs by mouth every 6 (six) hours as needed for indigestion. 05/28/22   Kommor, Madison, MD  atorvastatin  (LIPITOR) 40 MG tablet Take 40 mg by mouth daily.    [provider]  busPIRone (BUSPAR) 15 MG tablet Take 15 mg by mouth 2 (two) times daily. 03/18/22   [provider]  diclofenac Sodium (VOLTAREN) 1 % GEL Apply 1 application. topically daily as needed (For pain). 03/18/22   [provider]  dicyclomine  (BENTYL ) 20 MG tablet Take 1 tablet (20 mg total) by mouth 2 (two) times daily. 05/28/22   Kommor, Madison, MD  DULoxetine (CYMBALTA) 20 MG capsule Take 20 mg by mouth daily. 01/16/22   [provider]  famotidine  (PEPCID )  20 MG tablet Take 1 tablet (20 mg total) by mouth 2 (two) times daily. 05/28/22   Kommor, Madison, MD  gabapentin  (NEURONTIN ) 300 MG capsule Take 300 mg by mouth daily as needed (For back pain). 03/18/22   [provider]  insulin  glargine (LANTUS  SOLOSTAR) 100 UNIT/ML Solostar Pen Inject 40 Units into the skin daily. 04/24/18   [provider]  JARDIANCE 25 MG TABS tablet Take 25 mg by mouth daily. 02/04/22   [provider]  LINZESS 145 MCG CAPS capsule Take 145 mcg by mouth daily. 03/18/22   [provider]  losartan  (COZAAR ) 100 MG tablet Take 100 mg by mouth daily. 05/13/22   [provider]  ondansetron  (ZOFRAN ) 4 MG tablet Take 4 mg by mouth every 8 (eight) hours as needed for nausea/vomiting. 05/13/22   [provider]  ondansetron  (ZOFRAN -ODT) 4 MG disintegrating tablet 4mg  ODT q4 hours prn nausea/vomit 11/08/23   Floyd, Dan, DO  pantoprazole  (PROTONIX ) 40 MG tablet Take 40 mg by mouth daily. 08/08/20   [provider]  pantoprazole  (PROTONIX ) 40 MG tablet Take 1 tablet (40 mg total) by mouth daily. 10/07/23   Bernard Franky, MD  prochlorperazine  (COMPAZINE ) 10 MG tablet Take 1 tablet (10 mg total) by mouth 2 (two) times daily as needed for nausea or vomiting. 12/24/21   Harris, Abigail, PA-C  promethazine  (PHENERGAN ) 25 MG suppository Place 1 suppository (25 mg total) rectally every 6 (six) hours as needed for nausea or vomiting. 11/08/23   Emil Share,  DO  sucralfate  (CARAFATE ) 1 g tablet Take 1 tablet (1 g total) by mouth 4 (four) times daily -  with meals and at bedtime. 10/04/22   Nanavati, Ankit, MD  tiZANidine (ZANAFLEX) 4 MG tablet Take 4 mg by mouth 3 (three) times daily. 01/29/22   [provider]    Allergies: Metformin  and related and Reglan  [metoclopramide ]    Review of Systems  Gastrointestinal:  Positive for abdominal pain.  All other systems reviewed and are negative.   Updated Vital Signs BP 104/65 (BP Location:  Right Arm)   Pulse 84   Temp 99.2 F (37.3 C) (Oral)   Resp 20   SpO2 100%   Physical Exam Vitals and nursing note reviewed.  Constitutional:      Appearance: He is well-developed.  HENT:     Head: Normocephalic and atraumatic.  Cardiovascular:     Rate and Rhythm: Normal rate and regular rhythm.     Heart sounds: No murmur heard. Pulmonary:     Effort: Pulmonary effort is normal. No respiratory distress.     Breath sounds: Normal breath sounds.  Abdominal:     Palpations: Abdomen is soft.     Tenderness: There is no guarding or rebound.     Comments: Moderate generalizable tenderness  Musculoskeletal:        General: No swelling or tenderness.  Skin:    General: Skin is warm and dry.  Neurological:     Mental Status: He is alert and oriented to person, place, and time.  Psychiatric:        Behavior: Behavior normal.     (all labs ordered are listed, but only abnormal results are displayed) Labs Reviewed  CBC - Abnormal; Notable for the following components:      Result Value   RBC 4.18 (*)    Hemoglobin 11.6 (*)    HCT 34.6 (*)    All other components within normal limits  LIPASE, BLOOD  COMPREHENSIVE METABOLIC PANEL WITH GFR  URINALYSIS, ROUTINE W REFLEX MICROSCOPIC    EKG: None  Radiology: No results found.   Procedures   Medications Ordered in the ED  morphine  (PF) 4 MG/ML injection 4 mg (has no administration in time range)  ondansetron  (ZOFRAN ) injection 4 mg (has no administration in time range)                                   Medical Decision Making Amount and/or Complexity of Data Reviewed Labs: ordered. Radiology: ordered.  Risk OTC drugs. Prescription drug management.   Patient with history of diabetes here for evaluation of generalized abdominal pain he has tenderness on examination without peritoneal findings. Labs are reassuring. CTM pelvis was obtained, which is negative for acute abnormality. He does have renal insufficiency,  renal function as a baseline. Patient did have recurrent vomiting during his ED stay. Will treat with additional antiemetic. Patient care transferred pending PO challenge, urinalysis and reevaluation.     Final diagnoses:  None    ED Discharge Orders     None          Griselda Norris, MD 06/26/24 [phone]

## 2024-06-26 NOTE — ED Triage Notes (Signed)
 Pt just discharged went to the bus stop and had another episode of vomiting and checked back in.

## 2024-06-26 NOTE — ED Triage Notes (Signed)
 Pt is coming from home with complaints of abd pain that started yesterday, he had some nausea and vomiting yesterday but has not had any vomiting today. He states he thinks he has a hole in his stomach. No blood in the vomit, no difficulty urinating, and no difficulty defecating. Pain is generalzied throughout the entire abd but no pain with palpation.   Medic vitals   168/100 96hr 20rr 97%ra 201bgl

## 2024-06-26 NOTE — ED Provider Notes (Signed)
  Provider Note MRN:  [id number]  Arrival date & time: 06/26/24    ED Course and Medical Decision Making  Assumed care from Dr Griselda at shift change.  See note from prior team for complete details, in brief:  Clinical Course as of 06/26/24 1010  Sat Jun 26, 2024  0704 Handoff ER 58 yo/m  Hx dm, cocaine use Here w/ abd pain, n/v PO chall, feeling better  [SG]  0946 Tolerating po w/o difficulty, feeling better [SG]  0958 Creatinine(!): 1.81 Similar to prior  [SG]    Clinical Course User Index [SG] Elnor Jayson LABOR, DO   He is feeling much better on recheck, labs and imaging are stable.  No evidence of bleeding  Likely gastritis, restart his PPI, Carafate .  He has multiple ER visits with similar complaints  Given GI f/u  Patient in no distress and overall condition is stable. Detailed discussions were had with the patient/guardian regarding current findings, and need for close f/u with PCP or on call doctor. The patient/guardian has been instructed to return immediately if the symptoms worsen in any way for re-evaluation. Patient/guardian verbalized understanding and is in agreement with current care plan. All questions answered prior to discharge.    Procedures  Final Clinical Impressions(s) / ED Diagnoses     ICD-10-CM   1. Epigastric pain  R10.13       ED Discharge Orders          Ordered    pantoprazole  (PROTONIX ) 40 MG tablet  Daily        06/26/24 1009    sucralfate  (CARAFATE ) 1 g tablet  3 times daily with meals        06/26/24 1009              Discharge Instructions      It was a pleasure caring for you today in the emergency department.  Recommend bland diet over the next 2 weeks, avoid tobacco, alcohol, NSAIDs, greasy or fried foods.  If you are still having symptoms in 2 weeks please follow-up with gastroenterology  Please return to the emergency department for any worsening or worrisome symptoms.          Elnor Jayson A, DO 06/26/24  1010

## 2024-09-29 ENCOUNTER — Encounter (HOSPITAL_COMMUNITY): Payer: Self-pay | Admitting: *Deleted

## 2024-09-29 ENCOUNTER — Emergency Department (HOSPITAL_COMMUNITY)

## 2024-09-29 ENCOUNTER — Other Ambulatory Visit: Payer: Self-pay

## 2024-09-29 ENCOUNTER — Emergency Department (HOSPITAL_COMMUNITY)
Admission: EM | Admit: 2024-09-29 | Discharge: 2024-09-30 | Disposition: A | Discharge status: Home / Self Care | Attending: Emergency Medicine | Admitting: Emergency Medicine

## 2024-09-29 DIAGNOSIS — Z5321 Procedure and treatment not carried out due to patient leaving prior to being seen by health care provider: Secondary | ICD-10-CM | POA: Diagnosis not present

## 2024-09-29 DIAGNOSIS — Z79899 Other long term (current) drug therapy: Secondary | ICD-10-CM | POA: Diagnosis not present

## 2024-09-29 DIAGNOSIS — R109 Unspecified abdominal pain: Secondary | ICD-10-CM | POA: Diagnosis present

## 2024-09-29 DIAGNOSIS — N189 Chronic kidney disease, unspecified: Secondary | ICD-10-CM | POA: Insufficient documentation

## 2024-09-29 DIAGNOSIS — K5900 Constipation, unspecified: Secondary | ICD-10-CM | POA: Insufficient documentation

## 2024-09-29 DIAGNOSIS — E1122 Type 2 diabetes mellitus with diabetic chronic kidney disease: Secondary | ICD-10-CM | POA: Diagnosis not present

## 2024-09-29 DIAGNOSIS — Z794 Long term (current) use of insulin: Secondary | ICD-10-CM | POA: Diagnosis not present

## 2024-09-29 DIAGNOSIS — I129 Hypertensive chronic kidney disease with stage 1 through stage 4 chronic kidney disease, or unspecified chronic kidney disease: Secondary | ICD-10-CM | POA: Insufficient documentation

## 2024-09-29 DIAGNOSIS — R112 Nausea with vomiting, unspecified: Secondary | ICD-10-CM | POA: Diagnosis present

## 2024-09-29 LAB — COMPREHENSIVE METABOLIC PANEL WITH GFR
ALT: 8 U/L (ref 0–44)
AST: 18 U/L (ref 15–41)
Albumin: 3.9 g/dL (ref 3.5–5.0)
Alkaline Phosphatase: 83 U/L (ref 38–126)
Anion gap: 8 (ref 5–15)
BUN: 22 mg/dL — ABNORMAL HIGH (ref 6–20)
CO2: 24 mmol/L (ref 22–32)
Calcium: 9.4 mg/dL (ref 8.9–10.3)
Chloride: 108 mmol/L (ref 98–111)
Creatinine, Ser: 1.75 mg/dL — ABNORMAL HIGH (ref 0.61–1.24)
GFR, Estimated: 44 mL/min — ABNORMAL LOW (ref >60)
Glucose, Bld: 173 mg/dL — ABNORMAL HIGH (ref 70–99)
Potassium: 4.1 mmol/L (ref 3.5–5.1)
Sodium: 140 mmol/L (ref 135–145)
Total Bilirubin: 0.8 mg/dL (ref 0.0–1.2)
Total Protein: 7.1 g/dL (ref 6.5–8.1)

## 2024-09-29 LAB — URINALYSIS, ROUTINE W REFLEX MICROSCOPIC
Bacteria, UA: NONE SEEN
Bilirubin Urine: NEGATIVE
Glucose, UA: 150 mg/dL — AB
Hgb urine dipstick: NEGATIVE
Ketones, ur: 5 mg/dL — AB
Leukocytes,Ua: NEGATIVE
Nitrite: NEGATIVE
Protein, ur: >=300 mg/dL — AB
Specific Gravity, Urine: 1.014 (ref 1.005–1.030)
pH: 7 (ref 5.0–8.0)

## 2024-09-29 LAB — CBC
HCT: 34.6 % — ABNORMAL LOW (ref 39.0–52.0)
Hemoglobin: 11.7 g/dL — ABNORMAL LOW (ref 13.0–17.0)
MCH: 28.4 pg (ref 26.0–34.0)
MCHC: 33.8 g/dL (ref 30.0–36.0)
MCV: 84 fL (ref 80.0–100.0)
Platelets: 280 K/uL (ref 150–400)
RBC: 4.12 MIL/uL — ABNORMAL LOW (ref 4.22–5.81)
RDW: 13.5 % (ref 11.5–15.5)
WBC: 8.2 K/uL (ref 4.0–10.5)
nRBC: 0 % (ref 0.0–0.2)

## 2024-09-29 LAB — LIPASE, BLOOD: Lipase: 16 U/L (ref 11–51)

## 2024-09-29 MED ORDER — LIDOCAINE HCL URETHRAL/MUCOSAL 2 % EX GEL
1.0000 | Freq: Once | CUTANEOUS | Status: AC
  Administered 2024-09-29: 1 via TOPICAL
  Filled 2024-09-29: qty 11

## 2024-09-29 MED ORDER — METOCLOPRAMIDE HCL 5 MG/ML IJ SOLN
10.0000 mg | Freq: Once | INTRAMUSCULAR | Status: DC
  Filled 2024-09-29: qty 2

## 2024-09-29 MED ORDER — SODIUM CHLORIDE 0.9 % IV BOLUS
1000.0000 mL | Freq: Once | INTRAVENOUS | Status: AC
Start: 2024-09-29 — End: 2024-09-29
  Administered 2024-09-29: 1000 mL via INTRAVENOUS

## 2024-09-29 MED ORDER — ONDANSETRON HCL 4 MG/2ML IJ SOLN
4.0000 mg | Freq: Once | INTRAMUSCULAR | Status: AC
  Administered 2024-09-29: 4 mg via INTRAVENOUS
  Filled 2024-09-29: qty 2

## 2024-09-29 MED ORDER — IOHEXOL 300 MG/ML  SOLN
80.0000 mL | Freq: Once | INTRAMUSCULAR | Status: AC | PRN
  Administered 2024-09-29: 80 mL via INTRAVENOUS

## 2024-09-29 MED ORDER — PROCHLORPERAZINE MALEATE 10 MG PO TABS: 10.0000 mg | ORAL_TABLET | Freq: Three times a day (TID) | ORAL | 0 refills | Status: AC | PRN

## 2024-09-29 MED ORDER — HYDROMORPHONE HCL 1 MG/ML IJ SOLN
1.0000 mg | Freq: Once | INTRAMUSCULAR | Status: AC
  Administered 2024-09-29: 1 mg via INTRAVENOUS
  Filled 2024-09-29: qty 1

## 2024-09-29 MED ORDER — PROCHLORPERAZINE EDISYLATE 10 MG/2ML IJ SOLN
10.0000 mg | Freq: Once | INTRAMUSCULAR | Status: AC
  Administered 2024-09-29: 10 mg via INTRAVENOUS
  Filled 2024-09-29: qty 2

## 2024-09-29 MED ORDER — FLEET ENEMA RE ENEM
1.0000 | ENEMA | Freq: Once | RECTAL | Status: AC
  Administered 2024-09-29: 1 via RECTAL
  Filled 2024-09-29: qty 1

## 2024-09-29 MED ORDER — DIPHENHYDRAMINE HCL 50 MG/ML IJ SOLN
50.0000 mg | Freq: Once | INTRAMUSCULAR | Status: AC
  Administered 2024-09-29: 50 mg via INTRAVENOUS
  Filled 2024-09-29: qty 1

## 2024-09-29 MED ORDER — HYDRALAZINE HCL 20 MG/ML IJ SOLN
10.0000 mg | Freq: Once | INTRAMUSCULAR | Status: AC
  Administered 2024-09-29: 10 mg via INTRAVENOUS
  Filled 2024-09-29: qty 1

## 2024-09-29 NOTE — ED Notes (Signed)
Bladder scan volume post void 0 mL.

## 2024-09-29 NOTE — ED Provider Notes (Signed)
 [place] EMERGENCY DEPARTMENT AT [facility] Provider Note   CSN: [id number] Arrival date & time: 09/29/24  1714     Patient presents with: Abdominal Pain and Urinary Retention   James Chang is a 58 y.o. male history of diabetes, hypertension, CKD, here presenting with abdominal pain and constipation and trouble urinating.  Patient states that he has not have a bowel movement for about 2 weeks now.  Patient states that he has some nausea and diffuse abdominal pain.  He also states that over the last 24 hours, he has not been able to urinate.  No history of bowel obstructions in the past   The history is provided by the patient.       Prior to Admission medications   Medication Sig Start Date End Date Taking? Authorizing Provider  ABILIFY MAINTENA 400 MG PRSY prefilled syringe Inject 1 each into the muscle every 30 (thirty) days. 03/18/22   [provider]  alum & mag hydroxide-simeth (MAALOX PLUS) 400-400-40 MG/5ML suspension Take 15 mLs by mouth every 6 (six) hours as needed for indigestion. 05/28/22   Kommor, Madison, MD  atorvastatin  (LIPITOR) 40 MG tablet Take 40 mg by mouth daily.    [provider]  busPIRone (BUSPAR) 15 MG tablet Take 15 mg by mouth 2 (two) times daily. 03/18/22   [provider]  diclofenac Sodium (VOLTAREN) 1 % GEL Apply 1 application. topically daily as needed (For pain). 03/18/22   [provider]  dicyclomine  (BENTYL ) 20 MG tablet Take 1 tablet (20 mg total) by mouth 2 (two) times daily. 05/28/22   Kommor, Madison, MD  DULoxetine (CYMBALTA) 20 MG capsule Take 20 mg by mouth daily. 01/16/22   [provider]  famotidine  (PEPCID ) 20 MG tablet Take 1 tablet (20 mg total) by mouth 2 (two) times daily. 05/28/22   Kommor, Madison, MD  gabapentin  (NEURONTIN ) 300 MG capsule Take 300 mg by mouth daily as needed (For back pain). 03/18/22   [provider]  insulin  glargine (LANTUS  SOLOSTAR) 100 UNIT/ML  Solostar Pen Inject 40 Units into the skin daily. 04/24/18   [provider]  JARDIANCE 25 MG TABS tablet Take 25 mg by mouth daily. 02/04/22   [provider]  LINZESS 145 MCG CAPS capsule Take 145 mcg by mouth daily. 03/18/22   [provider]  losartan  (COZAAR ) 100 MG tablet Take 100 mg by mouth daily. 05/13/22   [provider]  ondansetron  (ZOFRAN ) 4 MG tablet Take 4 mg by mouth every 8 (eight) hours as needed for nausea/vomiting. 05/13/22   [provider]  ondansetron  (ZOFRAN -ODT) 4 MG disintegrating tablet 4mg  ODT q4 hours prn nausea/vomit 11/08/23   Floyd, Dan, DO  pantoprazole  (PROTONIX ) 40 MG tablet Take 1 tablet (40 mg total) by mouth daily for 14 days. 06/26/24 07/10/24  Elnor Jayson LABOR, DO  prochlorperazine  (COMPAZINE ) 10 MG tablet Take 1 tablet (10 mg total) by mouth 2 (two) times daily as needed for nausea or vomiting. 12/24/21   Harris, Abigail, PA-C  promethazine  (PHENERGAN ) 25 MG suppository Place 1 suppository (25 mg total) rectally every 6 (six) hours as needed for nausea or vomiting. 11/08/23   Emil Share, DO  sucralfate  (CARAFATE ) 1 g tablet Take 1 tablet (1 g total) by mouth with breakfast, with lunch, and with evening meal for 7 days. 06/26/24 07/03/24  Elnor Jayson LABOR, DO  tiZANidine (ZANAFLEX) 4 MG tablet Take 4 mg by mouth 3 (three) times daily. 01/29/22  [provider]    Allergies: Metformin  and related and Reglan  [metoclopramide ]    Review of Systems  Gastrointestinal:  Positive for abdominal pain.  All other systems reviewed and are negative.   Updated Vital Signs BP (!) 146/88   Pulse 79   Temp 98.5 F (36.9 C) (Oral)   Resp 20 Comment: Simultaneous filing. User may not have seen previous data.  Ht 5' 6 (1.676 m)   Wt 78.9 kg   SpO2 97%   BMI 28.08 kg/m   Physical Exam Vitals and nursing note reviewed.  Constitutional:      Comments: Chronically ill and uncomfortable  HENT:     Head: Normocephalic.      Mouth/Throat:     Pharynx: Oropharynx is clear.  Eyes:     Extraocular Movements: Extraocular movements intact.     Pupils: Pupils are equal, round, and reactive to light.  Cardiovascular:     Rate and Rhythm: Normal rate and regular rhythm.     Heart sounds: Normal heart sounds.  Pulmonary:     Effort: Pulmonary effort is normal.     Breath sounds: Normal breath sounds.  Abdominal:     Comments: Distended and mild diffuse tenderness  Genitourinary:    Comments: Rectal + stool impaction  Skin:    General: Skin is warm.     Capillary Refill: Capillary refill takes less than 2 seconds.  Neurological:     General: No focal deficit present.     Mental Status: He is oriented to person, place, and time.  Psychiatric:        Mood and Affect: Mood normal.        Behavior: Behavior normal.     (all labs ordered are listed, but only abnormal results are displayed) Labs Reviewed  COMPREHENSIVE METABOLIC PANEL WITH GFR - Abnormal; Notable for the following components:      Result Value   Glucose, Bld 173 (*)    BUN 22 (*)    Creatinine, Ser 1.75 (*)    GFR, Estimated 44 (*)    All other components within normal limits  CBC - Abnormal; Notable for the following components:   RBC 4.12 (*)    Hemoglobin 11.7 (*)    HCT 34.6 (*)    All other components within normal limits  URINALYSIS, ROUTINE W REFLEX MICROSCOPIC - Abnormal; Notable for the following components:   Glucose, UA 150 (*)    Ketones, ur 5 (*)    Protein, ur >=300 (*)    All other components within normal limits  LIPASE, BLOOD    EKG: None  Radiology: No results found.   Procedures   Medications Ordered in the ED  HYDROmorphone  (DILAUDID ) injection 1 mg (1 mg Intravenous Given 09/29/24 1758)  ondansetron  (ZOFRAN ) injection 4 mg (4 mg Intravenous Given 09/29/24 1757)  sodium chloride  0.9 % bolus 1,000 mL (1,000 mLs Intravenous New Bag/Given 09/29/24 1757)  sodium phosphate (FLEET) enema 1 enema (1 enema Rectal  Given by Other 09/29/24 1825)  lidocaine  (XYLOCAINE ) 2 % jelly 1 Application (1 Application Topical Given by Other 09/29/24 1825)  hydrALAZINE  (APRESOLINE ) injection 10 mg (10 mg Intravenous Given by Other 09/29/24 1759)                                    Medical Decision Making James Chang is a 58 y.o. male here presenting with abdominal pain and  trouble urinating.  Patient has been constipated for about 2 weeks now.  Patient has had decreased p.o. intake.  Patient was inability urinate about 200 cc and nursing was able to get a bladder scan which showed 0 cc in the bladder.  I think his trouble urinating is not from urinary retention but from dehydration and constipation.  Patient has large amount of stool in the rectal vault.  I was able to give him an enema with good relief.  Consider ileus versus partial small bowel obstruction versus UTI.  Plan to get CBC and CMP and UA and CT abdomen/pelvis    10:55 PM Reviewed patient's labs and creatinine is stable.  Patient had a large bowel movement after enema.  Patent then started vomiting.  Patient CT showed no obstruction but patient has steri colitis.  Patient was feeling after nausea medicine and tolerated p.o.  Patient stable for discharge.   Problems Addressed: Constipation, unspecified constipation type: acute illness or injury  Amount and/or Complexity of Data Reviewed Labs: ordered. Decision-making details documented in ED Course. Radiology: ordered and independent interpretation performed. Decision-making details documented in ED Course.  Risk OTC drugs. Prescription drug management.     Final diagnoses:  None    ED Discharge Orders     None          Patt Alm Macho, MD 09/29/24 2311

## 2024-09-29 NOTE — Discharge Instructions (Addendum)
 As we discussed, you are constipated.  You were given an enema and you are likely going to have more bowel movement  If you do not have any bowel movements tomorrow, I recommend you take MiraLAX  twice daily  Please stay hydrated  Please take Compazine  as needed for nausea  Follow-up with your doctor  Return to ER if you have severe abdominal pain or vomiting

## 2024-09-29 NOTE — ED Notes (Signed)
 Pt had very large BM following enema, MD aware.

## 2024-09-29 NOTE — ED Triage Notes (Addendum)
 BIB EMS due to 3 days of urinary retention and abd pain, N/V x 2 days, vomited at registration, 12 Ld NSR 84, 99% RA, CBG 191 180/p- 650 mg tylenol , #20 L AC 4 mg Zofran  400 ml NS.

## 2024-09-30 ENCOUNTER — Other Ambulatory Visit: Payer: Self-pay

## 2024-09-30 ENCOUNTER — Emergency Department (HOSPITAL_COMMUNITY)
Admission: EM | Admit: 2024-09-30 | Discharge: 2024-09-30 | Discharge status: Against Medical Advice | Source: Home / Self Care | Attending: Emergency Medicine | Admitting: Emergency Medicine

## 2024-09-30 DIAGNOSIS — R109 Unspecified abdominal pain: Secondary | ICD-10-CM | POA: Insufficient documentation

## 2024-09-30 DIAGNOSIS — R112 Nausea with vomiting, unspecified: Secondary | ICD-10-CM | POA: Insufficient documentation

## 2024-09-30 DIAGNOSIS — Z5321 Procedure and treatment not carried out due to patient leaving prior to being seen by health care provider: Secondary | ICD-10-CM | POA: Insufficient documentation

## 2024-09-30 DIAGNOSIS — K5900 Constipation, unspecified: Secondary | ICD-10-CM | POA: Diagnosis not present

## 2024-09-30 NOTE — ED Triage Notes (Signed)
 Patient here last night and dc'd. Patient c/o n/v and abdominal pain. Patient in waiting room all night and checked back in.

## 2024-09-30 NOTE — ED Notes (Signed)
 Attempted to gather patients vitals, but he is not in the lobby.

## 2024-09-30 NOTE — ED Notes (Signed)
 Patient d/c home with home care instructions. Patient stated he needs a ride home. Nurse offered a bus pass and he refused stating  I have never rode a bus in my life

## 2024-09-30 NOTE — ED Notes (Signed)
 Patient stated he can have someone to pick him up in the morning and he will sit in waiting area if necessary.

## 2024-12-13 ENCOUNTER — Emergency Department (HOSPITAL_COMMUNITY)
Admission: EM | Admit: 2024-12-13 | Discharge: 2024-12-14 | Disposition: A | Attending: Emergency Medicine | Admitting: Emergency Medicine

## 2024-12-13 ENCOUNTER — Emergency Department (HOSPITAL_COMMUNITY)

## 2024-12-13 ENCOUNTER — Other Ambulatory Visit: Payer: Self-pay

## 2024-12-13 ENCOUNTER — Encounter (HOSPITAL_COMMUNITY): Payer: Self-pay

## 2024-12-13 DIAGNOSIS — R7989 Other specified abnormal findings of blood chemistry: Secondary | ICD-10-CM

## 2024-12-13 DIAGNOSIS — Z794 Long term (current) use of insulin: Secondary | ICD-10-CM | POA: Insufficient documentation

## 2024-12-13 DIAGNOSIS — R112 Nausea with vomiting, unspecified: Secondary | ICD-10-CM

## 2024-12-13 DIAGNOSIS — N189 Chronic kidney disease, unspecified: Secondary | ICD-10-CM | POA: Insufficient documentation

## 2024-12-13 DIAGNOSIS — R079 Chest pain, unspecified: Secondary | ICD-10-CM

## 2024-12-13 DIAGNOSIS — I161 Hypertensive emergency: Secondary | ICD-10-CM

## 2024-12-13 DIAGNOSIS — R1084 Generalized abdominal pain: Secondary | ICD-10-CM

## 2024-12-13 DIAGNOSIS — K5900 Constipation, unspecified: Secondary | ICD-10-CM | POA: Insufficient documentation

## 2024-12-13 DIAGNOSIS — I129 Hypertensive chronic kidney disease with stage 1 through stage 4 chronic kidney disease, or unspecified chronic kidney disease: Secondary | ICD-10-CM | POA: Insufficient documentation

## 2024-12-13 DIAGNOSIS — Z79899 Other long term (current) drug therapy: Secondary | ICD-10-CM | POA: Insufficient documentation

## 2024-12-13 LAB — COMPREHENSIVE METABOLIC PANEL WITH GFR
ALT: 8 U/L (ref 0–44)
AST: 16 U/L (ref 15–41)
Albumin: 3.9 g/dL (ref 3.5–5.0)
Alkaline Phosphatase: 75 U/L (ref 38–126)
Anion gap: 13 (ref 5–15)
BUN: 17 mg/dL (ref 6–20)
CO2: 21 mmol/L — ABNORMAL LOW (ref 22–32)
Calcium: 9.3 mg/dL (ref 8.9–10.3)
Chloride: 106 mmol/L (ref 98–111)
Creatinine, Ser: 1.73 mg/dL — ABNORMAL HIGH (ref 0.61–1.24)
GFR, Estimated: 45 mL/min — ABNORMAL LOW
Glucose, Bld: 198 mg/dL — ABNORMAL HIGH (ref 70–99)
Potassium: 3.8 mmol/L (ref 3.5–5.1)
Sodium: 140 mmol/L (ref 135–145)
Total Bilirubin: 0.8 mg/dL (ref 0.0–1.2)
Total Protein: 7 g/dL (ref 6.5–8.1)

## 2024-12-13 LAB — I-STAT CHEM 8, ED
BUN: 17 mg/dL (ref 6–20)
Calcium, Ion: 1.15 mmol/L (ref 1.15–1.40)
Chloride: 105 mmol/L (ref 98–111)
Creatinine, Ser: 1.8 mg/dL — ABNORMAL HIGH (ref 0.61–1.24)
Glucose, Bld: 197 mg/dL — ABNORMAL HIGH (ref 70–99)
HCT: 35 % — ABNORMAL LOW (ref 39.0–52.0)
Hemoglobin: 11.9 g/dL — ABNORMAL LOW (ref 13.0–17.0)
Potassium: 3.6 mmol/L (ref 3.5–5.1)
Sodium: 142 mmol/L (ref 135–145)
TCO2: 24 mmol/L (ref 22–32)

## 2024-12-13 LAB — CBC
HCT: 35.8 % — ABNORMAL LOW (ref 39.0–52.0)
Hemoglobin: 12.2 g/dL — ABNORMAL LOW (ref 13.0–17.0)
MCH: 28.7 pg (ref 26.0–34.0)
MCHC: 34.1 g/dL (ref 30.0–36.0)
MCV: 84.2 fL (ref 80.0–100.0)
Platelets: 316 10*3/uL (ref 150–400)
RBC: 4.25 MIL/uL (ref 4.22–5.81)
RDW: 13.2 % (ref 11.5–15.5)
WBC: 9.5 10*3/uL (ref 4.0–10.5)
nRBC: 0 % (ref 0.0–0.2)

## 2024-12-13 LAB — URINALYSIS, ROUTINE W REFLEX MICROSCOPIC
Bilirubin Urine: NEGATIVE
Glucose, UA: 150 mg/dL — AB
Ketones, ur: NEGATIVE mg/dL
Leukocytes,Ua: NEGATIVE
Nitrite: NEGATIVE
Protein, ur: >=300 mg/dL — AB
Specific Gravity, Urine: 1.012 (ref 1.005–1.030)
pH: 6 (ref 5.0–8.0)

## 2024-12-13 LAB — TROPONIN T, HIGH SENSITIVITY
Troponin T High Sensitivity: 72 ng/L — ABNORMAL HIGH (ref 0–19)
Troponin T High Sensitivity: 58 ng/L — ABNORMAL HIGH (ref 0–19)

## 2024-12-13 LAB — LIPASE, BLOOD: Lipase: 12 U/L (ref 11–51)

## 2024-12-13 MED ORDER — METOCLOPRAMIDE HCL 5 MG/ML IJ SOLN
10.0000 mg | Freq: Once | INTRAMUSCULAR | Status: DC
  Filled 2024-12-13: qty 2

## 2024-12-13 MED ORDER — FLEET ENEMA RE ENEM
1.0000 | ENEMA | Freq: Once | RECTAL | Status: AC
  Administered 2024-12-13: 1 via RECTAL
  Filled 2024-12-13: qty 1

## 2024-12-13 MED ORDER — ONDANSETRON 4 MG PO TBDP
4.0000 mg | ORAL_TABLET | Freq: Once | ORAL | Status: AC
  Administered 2024-12-13: 4 mg via ORAL
  Filled 2024-12-13: qty 1

## 2024-12-13 MED ORDER — ACETAMINOPHEN 500 MG PO TABS
1000.0000 mg | ORAL_TABLET | Freq: Once | ORAL | Status: AC
  Administered 2024-12-13: 1000 mg via ORAL
  Filled 2024-12-13: qty 2

## 2024-12-13 MED ORDER — IOHEXOL 350 MG/ML SOLN
100.0000 mL | Freq: Once | INTRAVENOUS | Status: AC | PRN
  Administered 2024-12-13: 100 mL via INTRAVENOUS

## 2024-12-13 MED ORDER — FENTANYL CITRATE (PF) 50 MCG/ML IJ SOSY
50.0000 ug | PREFILLED_SYRINGE | Freq: Once | INTRAMUSCULAR | Status: AC
  Administered 2024-12-13: 50 ug via INTRAVENOUS
  Filled 2024-12-13: qty 1

## 2024-12-13 MED ORDER — HYDRALAZINE HCL 20 MG/ML IJ SOLN
10.0000 mg | Freq: Once | INTRAMUSCULAR | Status: AC
  Administered 2024-12-13: 10 mg via INTRAVENOUS
  Filled 2024-12-13: qty 1

## 2024-12-13 MED ORDER — LACTULOSE 10 GM/15ML PO SOLN
30.0000 g | Freq: Once | ORAL | Status: AC
  Administered 2024-12-13: 30 g via ORAL
  Filled 2024-12-13: qty 60

## 2024-12-13 NOTE — ED Notes (Signed)
 Patient transported to CT

## 2024-12-13 NOTE — Discharge Instructions (Signed)
 Please use 17 g or 1 cap of MiraLAX  daily and increase to twice a day until bowel movements are soft and regular.  Full glass of water  with each dose and try to increase your daily water  intake, vegetables, and physical exercise to help decrease constipation.  Do not use opioids or chronic pain medications that further worsen constipation.  Continue MiraLAX  daily until discontinued by primary care physician.

## 2024-12-14 ENCOUNTER — Encounter (HOSPITAL_COMMUNITY): Payer: Self-pay

## 2024-12-14 ENCOUNTER — Emergency Department (HOSPITAL_COMMUNITY)
Admission: EM | Admit: 2024-12-14 | Discharge: 2024-12-14 | Disposition: A | Discharge status: Home / Self Care | Attending: Emergency Medicine | Admitting: Emergency Medicine

## 2024-12-14 DIAGNOSIS — N189 Chronic kidney disease, unspecified: Secondary | ICD-10-CM | POA: Insufficient documentation

## 2024-12-14 DIAGNOSIS — Z794 Long term (current) use of insulin: Secondary | ICD-10-CM | POA: Insufficient documentation

## 2024-12-14 DIAGNOSIS — I129 Hypertensive chronic kidney disease with stage 1 through stage 4 chronic kidney disease, or unspecified chronic kidney disease: Secondary | ICD-10-CM | POA: Insufficient documentation

## 2024-12-14 DIAGNOSIS — E1122 Type 2 diabetes mellitus with diabetic chronic kidney disease: Secondary | ICD-10-CM | POA: Insufficient documentation

## 2024-12-14 DIAGNOSIS — R112 Nausea with vomiting, unspecified: Secondary | ICD-10-CM | POA: Insufficient documentation

## 2024-12-14 MED ORDER — DIPHENHYDRAMINE HCL 25 MG PO CAPS
25.0000 mg | ORAL_CAPSULE | Freq: Once | ORAL | Status: AC
  Administered 2024-12-14: 25 mg via ORAL
  Filled 2024-12-14: qty 1

## 2024-12-14 MED ORDER — PROCHLORPERAZINE EDISYLATE 10 MG/2ML IJ SOLN
10.0000 mg | Freq: Once | INTRAMUSCULAR | Status: AC
  Administered 2024-12-14: 10 mg via INTRAMUSCULAR
  Filled 2024-12-14: qty 2

## 2024-12-14 MED ORDER — ONDANSETRON HCL 4 MG/2ML IJ SOLN
4.0000 mg | Freq: Once | INTRAMUSCULAR | Status: AC
  Administered 2024-12-14: 4 mg via INTRAVENOUS
  Filled 2024-12-14: qty 2

## 2024-12-14 NOTE — ED Notes (Signed)
 Patient found in fetal position in bed with covers over his head. Alert and oriented without obvious distress. Patient reports several episodes of vomiting but only with oral intake. No issues with diarrhea since taken care yesterday. Multiple episodes of vomiting approximately 6 but only occurs with attempts to eat or drink. Patient stated he did not want to go home only to come back for the continued problem. Reports periodic dull ache across abd area.

## 2024-12-14 NOTE — ED Notes (Signed)
 Pt had episode of vomiting prior to dc, MD notified and pt agreed to IV zofran . Pt ambulatory to lobby and provided bus pass and snack bag.

## 2024-12-14 NOTE — ED Provider Notes (Signed)
 I assumed care of this patient from previous provider.  Please see their note for further details of history, exam, and MDM.   Briefly patient is a 59 y.o. male who presented for constipation and developed chest discomfort while having bowel movements and retching.  Initial EKG was reassuring.  Initial troponin elevated at 72.  No priors from before.  Currently awaiting delta troponin.  Delta troponin downtrending to 58.  Patient no longer has chest pain.  Will refer him to cardiology.  Patient is requesting to be discharged home.  The patient appears reasonably screened and/or stabilized for discharge and I doubt any other medical condition or other [facility] requiring further screening, evaluation, or treatment in the ED at this time. I have discussed the findings, Dx and Tx plan with the patient/family who expressed understanding and agree(s) with the plan. Discharge instructions discussed at length. The patient/family was given strict return precautions who verbalized understanding of the instructions. No further questions at time of discharge.  Disposition: Discharge  Condition: Good  ED Discharge Orders          Ordered    Ambulatory referral to Cardiology       Comments: If you have not heard from the Cardiology office within the next 72 hours please call [phone].   12/14/24 0046              Follow Up: Haze Kingfisher, MD [address] [place] [address] [phone]   Follow-up with your primary care physician for further guidance on chronic constipation and blood pressure recheck.         Trine Raynell Moder, MD 12/14/24 [phone]

## 2024-12-14 NOTE — ED Notes (Signed)
 Patient is resting comfortably.
